# Patient Record
Sex: Male | Born: 1945
Health system: Southern US, Community
[De-identification: ages and names within clinical notes are randomized; demographics above are authoritative.]

## PROBLEM LIST (undated history)

## (undated) DIAGNOSIS — Z800 Family history of malignant neoplasm of digestive organs: Secondary | ICD-10-CM

## (undated) DIAGNOSIS — M1990 Unspecified osteoarthritis, unspecified site: Secondary | ICD-10-CM

## (undated) DIAGNOSIS — K602 Anal fissure, unspecified: Secondary | ICD-10-CM

## (undated) DIAGNOSIS — I2510 Atherosclerotic heart disease of native coronary artery without angina pectoris: Secondary | ICD-10-CM

## (undated) DIAGNOSIS — T8859XA Other complications of anesthesia, initial encounter: Secondary | ICD-10-CM

## (undated) DIAGNOSIS — N200 Calculus of kidney: Secondary | ICD-10-CM

## (undated) DIAGNOSIS — K5792 Diverticulitis of intestine, part unspecified, without perforation or abscess without bleeding: Secondary | ICD-10-CM

## (undated) DIAGNOSIS — R112 Nausea with vomiting, unspecified: Secondary | ICD-10-CM

## (undated) DIAGNOSIS — Z9889 Other specified postprocedural states: Secondary | ICD-10-CM

## (undated) DIAGNOSIS — D649 Anemia, unspecified: Secondary | ICD-10-CM

## (undated) DIAGNOSIS — Z87442 Personal history of urinary calculi: Secondary | ICD-10-CM

## (undated) HISTORY — PX: SPINAL CORD STIMULATOR IMPLANT: SHX2422

## (undated) HISTORY — PX: OTHER SURGICAL HISTORY: SHX169

## (undated) HISTORY — DX: Family history of malignant neoplasm of digestive organs: Z80.0

## (undated) HISTORY — DX: Diverticulitis of intestine, part unspecified, without perforation or abscess without bleeding: K57.92

## (undated) HISTORY — PX: CIRCUMCISION: SUR203

## (undated) HISTORY — DX: Anal fissure, unspecified: K60.2

## (undated) HISTORY — DX: Calculus of kidney: N20.0

## (undated) SURGERY — COLONOSCOPY
Anesthesia: Moderate Sedation

---

## 2010-05-02 ENCOUNTER — Ambulatory Visit: Payer: Self-pay | Admitting: Cardiology

## 2011-11-10 DIAGNOSIS — S4350XA Sprain of unspecified acromioclavicular joint, initial encounter: Secondary | ICD-10-CM | POA: Diagnosis not present

## 2011-11-12 DIAGNOSIS — M25519 Pain in unspecified shoulder: Secondary | ICD-10-CM | POA: Diagnosis not present

## 2011-11-12 DIAGNOSIS — S4350XA Sprain of unspecified acromioclavicular joint, initial encounter: Secondary | ICD-10-CM | POA: Diagnosis not present

## 2011-11-17 DIAGNOSIS — M47817 Spondylosis without myelopathy or radiculopathy, lumbosacral region: Secondary | ICD-10-CM | POA: Diagnosis not present

## 2011-11-17 DIAGNOSIS — M5126 Other intervertebral disc displacement, lumbar region: Secondary | ICD-10-CM | POA: Diagnosis not present

## 2011-11-23 DIAGNOSIS — M5126 Other intervertebral disc displacement, lumbar region: Secondary | ICD-10-CM | POA: Diagnosis not present

## 2011-11-23 DIAGNOSIS — M6281 Muscle weakness (generalized): Secondary | ICD-10-CM | POA: Diagnosis not present

## 2011-11-23 DIAGNOSIS — IMO0001 Reserved for inherently not codable concepts without codable children: Secondary | ICD-10-CM | POA: Diagnosis not present

## 2011-11-23 DIAGNOSIS — M47817 Spondylosis without myelopathy or radiculopathy, lumbosacral region: Secondary | ICD-10-CM | POA: Diagnosis not present

## 2011-11-25 DIAGNOSIS — IMO0001 Reserved for inherently not codable concepts without codable children: Secondary | ICD-10-CM | POA: Diagnosis not present

## 2011-11-25 DIAGNOSIS — M5126 Other intervertebral disc displacement, lumbar region: Secondary | ICD-10-CM | POA: Diagnosis not present

## 2011-11-25 DIAGNOSIS — M6281 Muscle weakness (generalized): Secondary | ICD-10-CM | POA: Diagnosis not present

## 2011-11-25 DIAGNOSIS — M47817 Spondylosis without myelopathy or radiculopathy, lumbosacral region: Secondary | ICD-10-CM | POA: Diagnosis not present

## 2011-11-30 DIAGNOSIS — IMO0001 Reserved for inherently not codable concepts without codable children: Secondary | ICD-10-CM | POA: Diagnosis not present

## 2011-11-30 DIAGNOSIS — M6281 Muscle weakness (generalized): Secondary | ICD-10-CM | POA: Diagnosis not present

## 2011-11-30 DIAGNOSIS — M47817 Spondylosis without myelopathy or radiculopathy, lumbosacral region: Secondary | ICD-10-CM | POA: Diagnosis not present

## 2011-11-30 DIAGNOSIS — M5126 Other intervertebral disc displacement, lumbar region: Secondary | ICD-10-CM | POA: Diagnosis not present

## 2011-12-02 DIAGNOSIS — M5126 Other intervertebral disc displacement, lumbar region: Secondary | ICD-10-CM | POA: Diagnosis not present

## 2011-12-02 DIAGNOSIS — IMO0001 Reserved for inherently not codable concepts without codable children: Secondary | ICD-10-CM | POA: Diagnosis not present

## 2011-12-02 DIAGNOSIS — M6281 Muscle weakness (generalized): Secondary | ICD-10-CM | POA: Diagnosis not present

## 2011-12-02 DIAGNOSIS — M47817 Spondylosis without myelopathy or radiculopathy, lumbosacral region: Secondary | ICD-10-CM | POA: Diagnosis not present

## 2011-12-07 DIAGNOSIS — M5126 Other intervertebral disc displacement, lumbar region: Secondary | ICD-10-CM | POA: Diagnosis not present

## 2011-12-07 DIAGNOSIS — M47817 Spondylosis without myelopathy or radiculopathy, lumbosacral region: Secondary | ICD-10-CM | POA: Diagnosis not present

## 2011-12-07 DIAGNOSIS — IMO0001 Reserved for inherently not codable concepts without codable children: Secondary | ICD-10-CM | POA: Diagnosis not present

## 2011-12-15 DIAGNOSIS — M47817 Spondylosis without myelopathy or radiculopathy, lumbosacral region: Secondary | ICD-10-CM | POA: Diagnosis not present

## 2011-12-15 DIAGNOSIS — M5126 Other intervertebral disc displacement, lumbar region: Secondary | ICD-10-CM | POA: Diagnosis not present

## 2011-12-15 DIAGNOSIS — IMO0001 Reserved for inherently not codable concepts without codable children: Secondary | ICD-10-CM | POA: Diagnosis not present

## 2011-12-17 DIAGNOSIS — M5126 Other intervertebral disc displacement, lumbar region: Secondary | ICD-10-CM | POA: Diagnosis not present

## 2011-12-17 DIAGNOSIS — M25519 Pain in unspecified shoulder: Secondary | ICD-10-CM | POA: Diagnosis not present

## 2011-12-17 DIAGNOSIS — IMO0001 Reserved for inherently not codable concepts without codable children: Secondary | ICD-10-CM | POA: Diagnosis not present

## 2011-12-17 DIAGNOSIS — M47817 Spondylosis without myelopathy or radiculopathy, lumbosacral region: Secondary | ICD-10-CM | POA: Diagnosis not present

## 2011-12-17 DIAGNOSIS — S4350XA Sprain of unspecified acromioclavicular joint, initial encounter: Secondary | ICD-10-CM | POA: Diagnosis not present

## 2011-12-22 DIAGNOSIS — M5126 Other intervertebral disc displacement, lumbar region: Secondary | ICD-10-CM | POA: Diagnosis not present

## 2011-12-22 DIAGNOSIS — M47817 Spondylosis without myelopathy or radiculopathy, lumbosacral region: Secondary | ICD-10-CM | POA: Diagnosis not present

## 2011-12-22 DIAGNOSIS — IMO0001 Reserved for inherently not codable concepts without codable children: Secondary | ICD-10-CM | POA: Diagnosis not present

## 2011-12-24 DIAGNOSIS — M5126 Other intervertebral disc displacement, lumbar region: Secondary | ICD-10-CM | POA: Diagnosis not present

## 2011-12-24 DIAGNOSIS — IMO0001 Reserved for inherently not codable concepts without codable children: Secondary | ICD-10-CM | POA: Diagnosis not present

## 2011-12-24 DIAGNOSIS — M47817 Spondylosis without myelopathy or radiculopathy, lumbosacral region: Secondary | ICD-10-CM | POA: Diagnosis not present

## 2011-12-28 DIAGNOSIS — M5126 Other intervertebral disc displacement, lumbar region: Secondary | ICD-10-CM | POA: Diagnosis not present

## 2011-12-28 DIAGNOSIS — IMO0001 Reserved for inherently not codable concepts without codable children: Secondary | ICD-10-CM | POA: Diagnosis not present

## 2011-12-28 DIAGNOSIS — M47817 Spondylosis without myelopathy or radiculopathy, lumbosacral region: Secondary | ICD-10-CM | POA: Diagnosis not present

## 2011-12-30 DIAGNOSIS — M47817 Spondylosis without myelopathy or radiculopathy, lumbosacral region: Secondary | ICD-10-CM | POA: Diagnosis not present

## 2011-12-30 DIAGNOSIS — IMO0001 Reserved for inherently not codable concepts without codable children: Secondary | ICD-10-CM | POA: Diagnosis not present

## 2011-12-30 DIAGNOSIS — M5126 Other intervertebral disc displacement, lumbar region: Secondary | ICD-10-CM | POA: Diagnosis not present

## 2012-01-01 DIAGNOSIS — IMO0001 Reserved for inherently not codable concepts without codable children: Secondary | ICD-10-CM | POA: Diagnosis not present

## 2012-01-01 DIAGNOSIS — M47817 Spondylosis without myelopathy or radiculopathy, lumbosacral region: Secondary | ICD-10-CM | POA: Diagnosis not present

## 2012-01-04 DIAGNOSIS — IMO0001 Reserved for inherently not codable concepts without codable children: Secondary | ICD-10-CM | POA: Diagnosis not present

## 2012-01-04 DIAGNOSIS — M47817 Spondylosis without myelopathy or radiculopathy, lumbosacral region: Secondary | ICD-10-CM | POA: Diagnosis not present

## 2012-01-06 DIAGNOSIS — IMO0001 Reserved for inherently not codable concepts without codable children: Secondary | ICD-10-CM | POA: Diagnosis not present

## 2012-01-06 DIAGNOSIS — M47817 Spondylosis without myelopathy or radiculopathy, lumbosacral region: Secondary | ICD-10-CM | POA: Diagnosis not present

## 2012-01-20 DIAGNOSIS — M47817 Spondylosis without myelopathy or radiculopathy, lumbosacral region: Secondary | ICD-10-CM | POA: Diagnosis not present

## 2012-01-21 DIAGNOSIS — M5137 Other intervertebral disc degeneration, lumbosacral region: Secondary | ICD-10-CM | POA: Diagnosis not present

## 2012-01-21 DIAGNOSIS — M47817 Spondylosis without myelopathy or radiculopathy, lumbosacral region: Secondary | ICD-10-CM | POA: Diagnosis not present

## 2012-01-21 DIAGNOSIS — M5126 Other intervertebral disc displacement, lumbar region: Secondary | ICD-10-CM | POA: Diagnosis not present

## 2012-01-21 DIAGNOSIS — M51379 Other intervertebral disc degeneration, lumbosacral region without mention of lumbar back pain or lower extremity pain: Secondary | ICD-10-CM | POA: Diagnosis not present

## 2012-02-12 DIAGNOSIS — M47817 Spondylosis without myelopathy or radiculopathy, lumbosacral region: Secondary | ICD-10-CM | POA: Diagnosis not present

## 2012-02-12 DIAGNOSIS — M5126 Other intervertebral disc displacement, lumbar region: Secondary | ICD-10-CM | POA: Diagnosis not present

## 2012-05-30 DIAGNOSIS — M5126 Other intervertebral disc displacement, lumbar region: Secondary | ICD-10-CM | POA: Diagnosis not present

## 2012-05-30 DIAGNOSIS — M47817 Spondylosis without myelopathy or radiculopathy, lumbosacral region: Secondary | ICD-10-CM | POA: Diagnosis not present

## 2012-06-17 DIAGNOSIS — Z01818 Encounter for other preprocedural examination: Secondary | ICD-10-CM | POA: Diagnosis not present

## 2012-06-17 DIAGNOSIS — E669 Obesity, unspecified: Secondary | ICD-10-CM | POA: Diagnosis not present

## 2012-06-17 DIAGNOSIS — N400 Enlarged prostate without lower urinary tract symptoms: Secondary | ICD-10-CM | POA: Diagnosis not present

## 2012-06-17 DIAGNOSIS — M47817 Spondylosis without myelopathy or radiculopathy, lumbosacral region: Secondary | ICD-10-CM | POA: Diagnosis not present

## 2012-06-17 DIAGNOSIS — R42 Dizziness and giddiness: Secondary | ICD-10-CM | POA: Diagnosis not present

## 2012-06-17 DIAGNOSIS — Z79899 Other long term (current) drug therapy: Secondary | ICD-10-CM | POA: Diagnosis not present

## 2012-06-21 DIAGNOSIS — Z8489 Family history of other specified conditions: Secondary | ICD-10-CM | POA: Diagnosis not present

## 2012-06-21 DIAGNOSIS — Z87442 Personal history of urinary calculi: Secondary | ICD-10-CM | POA: Diagnosis not present

## 2012-06-21 DIAGNOSIS — Z981 Arthrodesis status: Secondary | ICD-10-CM | POA: Diagnosis not present

## 2012-06-21 DIAGNOSIS — K146 Glossodynia: Secondary | ICD-10-CM | POA: Diagnosis not present

## 2012-06-21 DIAGNOSIS — N400 Enlarged prostate without lower urinary tract symptoms: Secondary | ICD-10-CM | POA: Diagnosis not present

## 2012-06-21 DIAGNOSIS — M47817 Spondylosis without myelopathy or radiculopathy, lumbosacral region: Secondary | ICD-10-CM | POA: Diagnosis not present

## 2012-06-21 DIAGNOSIS — Z6835 Body mass index (BMI) 35.0-35.9, adult: Secondary | ICD-10-CM | POA: Diagnosis not present

## 2012-06-21 DIAGNOSIS — E669 Obesity, unspecified: Secondary | ICD-10-CM | POA: Diagnosis not present

## 2012-06-21 DIAGNOSIS — Z800 Family history of malignant neoplasm of digestive organs: Secondary | ICD-10-CM | POA: Diagnosis not present

## 2012-06-21 DIAGNOSIS — R112 Nausea with vomiting, unspecified: Secondary | ICD-10-CM | POA: Diagnosis not present

## 2012-06-21 DIAGNOSIS — Z9889 Other specified postprocedural states: Secondary | ICD-10-CM | POA: Diagnosis not present

## 2012-06-21 DIAGNOSIS — Z79899 Other long term (current) drug therapy: Secondary | ICD-10-CM | POA: Diagnosis not present

## 2012-07-01 DIAGNOSIS — Z981 Arthrodesis status: Secondary | ICD-10-CM | POA: Diagnosis not present

## 2012-07-01 DIAGNOSIS — M47817 Spondylosis without myelopathy or radiculopathy, lumbosacral region: Secondary | ICD-10-CM | POA: Diagnosis not present

## 2012-09-07 DIAGNOSIS — Z23 Encounter for immunization: Secondary | ICD-10-CM | POA: Diagnosis not present

## 2012-09-08 DIAGNOSIS — IMO0001 Reserved for inherently not codable concepts without codable children: Secondary | ICD-10-CM | POA: Diagnosis not present

## 2012-09-08 DIAGNOSIS — M47817 Spondylosis without myelopathy or radiculopathy, lumbosacral region: Secondary | ICD-10-CM | POA: Diagnosis not present

## 2012-09-15 DIAGNOSIS — IMO0001 Reserved for inherently not codable concepts without codable children: Secondary | ICD-10-CM | POA: Diagnosis not present

## 2012-09-15 DIAGNOSIS — M47817 Spondylosis without myelopathy or radiculopathy, lumbosacral region: Secondary | ICD-10-CM | POA: Diagnosis not present

## 2012-09-22 DIAGNOSIS — M47817 Spondylosis without myelopathy or radiculopathy, lumbosacral region: Secondary | ICD-10-CM | POA: Diagnosis not present

## 2012-09-22 DIAGNOSIS — IMO0001 Reserved for inherently not codable concepts without codable children: Secondary | ICD-10-CM | POA: Diagnosis not present

## 2012-10-06 DIAGNOSIS — E669 Obesity, unspecified: Secondary | ICD-10-CM | POA: Diagnosis not present

## 2012-10-06 DIAGNOSIS — E7800 Pure hypercholesterolemia, unspecified: Secondary | ICD-10-CM | POA: Diagnosis not present

## 2012-10-06 DIAGNOSIS — N400 Enlarged prostate without lower urinary tract symptoms: Secondary | ICD-10-CM | POA: Diagnosis not present

## 2012-10-12 DIAGNOSIS — E669 Obesity, unspecified: Secondary | ICD-10-CM | POA: Diagnosis not present

## 2012-10-12 DIAGNOSIS — M545 Low back pain: Secondary | ICD-10-CM | POA: Diagnosis not present

## 2012-10-12 DIAGNOSIS — E7800 Pure hypercholesterolemia, unspecified: Secondary | ICD-10-CM | POA: Diagnosis not present

## 2012-10-12 DIAGNOSIS — N400 Enlarged prostate without lower urinary tract symptoms: Secondary | ICD-10-CM | POA: Diagnosis not present

## 2012-10-19 DIAGNOSIS — M47817 Spondylosis without myelopathy or radiculopathy, lumbosacral region: Secondary | ICD-10-CM | POA: Diagnosis not present

## 2012-12-19 DIAGNOSIS — M79609 Pain in unspecified limb: Secondary | ICD-10-CM | POA: Diagnosis not present

## 2012-12-19 DIAGNOSIS — L600 Ingrowing nail: Secondary | ICD-10-CM | POA: Diagnosis not present

## 2013-01-19 DIAGNOSIS — M5126 Other intervertebral disc displacement, lumbar region: Secondary | ICD-10-CM | POA: Diagnosis not present

## 2013-01-19 DIAGNOSIS — M47817 Spondylosis without myelopathy or radiculopathy, lumbosacral region: Secondary | ICD-10-CM | POA: Diagnosis not present

## 2013-03-01 DIAGNOSIS — L299 Pruritus, unspecified: Secondary | ICD-10-CM | POA: Diagnosis not present

## 2013-03-01 DIAGNOSIS — K2960 Other gastritis without bleeding: Secondary | ICD-10-CM | POA: Diagnosis not present

## 2013-03-03 DIAGNOSIS — Z981 Arthrodesis status: Secondary | ICD-10-CM | POA: Diagnosis not present

## 2013-03-03 DIAGNOSIS — M47817 Spondylosis without myelopathy or radiculopathy, lumbosacral region: Secondary | ICD-10-CM | POA: Diagnosis not present

## 2013-03-07 DIAGNOSIS — T85695A Other mechanical complication of other nervous system device, implant or graft, initial encounter: Secondary | ICD-10-CM | POA: Diagnosis not present

## 2013-03-07 DIAGNOSIS — M4800 Spinal stenosis, site unspecified: Secondary | ICD-10-CM | POA: Diagnosis not present

## 2013-03-07 DIAGNOSIS — M47817 Spondylosis without myelopathy or radiculopathy, lumbosacral region: Secondary | ICD-10-CM | POA: Diagnosis not present

## 2013-03-07 DIAGNOSIS — M5137 Other intervertebral disc degeneration, lumbosacral region: Secondary | ICD-10-CM | POA: Diagnosis not present

## 2013-03-07 DIAGNOSIS — Z981 Arthrodesis status: Secondary | ICD-10-CM | POA: Diagnosis not present

## 2013-03-13 DIAGNOSIS — M5126 Other intervertebral disc displacement, lumbar region: Secondary | ICD-10-CM | POA: Diagnosis not present

## 2013-03-13 DIAGNOSIS — M47817 Spondylosis without myelopathy or radiculopathy, lumbosacral region: Secondary | ICD-10-CM | POA: Diagnosis not present

## 2013-03-30 DIAGNOSIS — D235 Other benign neoplasm of skin of trunk: Secondary | ICD-10-CM | POA: Diagnosis not present

## 2013-03-30 DIAGNOSIS — L570 Actinic keratosis: Secondary | ICD-10-CM | POA: Diagnosis not present

## 2013-03-30 DIAGNOSIS — L820 Inflamed seborrheic keratosis: Secondary | ICD-10-CM | POA: Diagnosis not present

## 2013-03-30 DIAGNOSIS — L259 Unspecified contact dermatitis, unspecified cause: Secondary | ICD-10-CM | POA: Diagnosis not present

## 2013-04-05 DIAGNOSIS — E7800 Pure hypercholesterolemia, unspecified: Secondary | ICD-10-CM | POA: Diagnosis not present

## 2013-04-05 DIAGNOSIS — E669 Obesity, unspecified: Secondary | ICD-10-CM | POA: Diagnosis not present

## 2013-04-05 DIAGNOSIS — M545 Low back pain: Secondary | ICD-10-CM | POA: Diagnosis not present

## 2013-04-05 DIAGNOSIS — N400 Enlarged prostate without lower urinary tract symptoms: Secondary | ICD-10-CM | POA: Diagnosis not present

## 2013-04-11 DIAGNOSIS — M5430 Sciatica, unspecified side: Secondary | ICD-10-CM | POA: Diagnosis not present

## 2013-04-11 DIAGNOSIS — M545 Low back pain: Secondary | ICD-10-CM | POA: Diagnosis not present

## 2013-04-11 DIAGNOSIS — E7800 Pure hypercholesterolemia, unspecified: Secondary | ICD-10-CM | POA: Diagnosis not present

## 2013-04-11 DIAGNOSIS — E669 Obesity, unspecified: Secondary | ICD-10-CM | POA: Diagnosis not present

## 2013-04-11 DIAGNOSIS — N400 Enlarged prostate without lower urinary tract symptoms: Secondary | ICD-10-CM | POA: Diagnosis not present

## 2013-05-31 DIAGNOSIS — N200 Calculus of kidney: Secondary | ICD-10-CM | POA: Diagnosis not present

## 2013-05-31 DIAGNOSIS — Z79899 Other long term (current) drug therapy: Secondary | ICD-10-CM | POA: Diagnosis not present

## 2013-05-31 DIAGNOSIS — G8929 Other chronic pain: Secondary | ICD-10-CM | POA: Diagnosis not present

## 2013-05-31 DIAGNOSIS — K439 Ventral hernia without obstruction or gangrene: Secondary | ICD-10-CM | POA: Diagnosis not present

## 2013-05-31 DIAGNOSIS — R109 Unspecified abdominal pain: Secondary | ICD-10-CM | POA: Diagnosis not present

## 2013-05-31 DIAGNOSIS — M549 Dorsalgia, unspecified: Secondary | ICD-10-CM | POA: Diagnosis not present

## 2013-05-31 DIAGNOSIS — R1033 Periumbilical pain: Secondary | ICD-10-CM | POA: Diagnosis not present

## 2013-07-25 DIAGNOSIS — M5126 Other intervertebral disc displacement, lumbar region: Secondary | ICD-10-CM | POA: Diagnosis not present

## 2013-07-25 DIAGNOSIS — M47817 Spondylosis without myelopathy or radiculopathy, lumbosacral region: Secondary | ICD-10-CM | POA: Diagnosis not present

## 2013-09-01 DIAGNOSIS — Z23 Encounter for immunization: Secondary | ICD-10-CM | POA: Diagnosis not present

## 2013-10-12 DIAGNOSIS — R809 Proteinuria, unspecified: Secondary | ICD-10-CM | POA: Diagnosis not present

## 2013-10-12 DIAGNOSIS — E669 Obesity, unspecified: Secondary | ICD-10-CM | POA: Diagnosis not present

## 2013-10-12 DIAGNOSIS — E7800 Pure hypercholesterolemia, unspecified: Secondary | ICD-10-CM | POA: Diagnosis not present

## 2013-10-19 DIAGNOSIS — E669 Obesity, unspecified: Secondary | ICD-10-CM | POA: Diagnosis not present

## 2013-10-19 DIAGNOSIS — N400 Enlarged prostate without lower urinary tract symptoms: Secondary | ICD-10-CM | POA: Diagnosis not present

## 2013-10-19 DIAGNOSIS — E7800 Pure hypercholesterolemia, unspecified: Secondary | ICD-10-CM | POA: Diagnosis not present

## 2013-10-19 DIAGNOSIS — M545 Low back pain: Secondary | ICD-10-CM | POA: Diagnosis not present

## 2013-10-19 DIAGNOSIS — M5430 Sciatica, unspecified side: Secondary | ICD-10-CM | POA: Diagnosis not present

## 2014-01-25 DIAGNOSIS — M47817 Spondylosis without myelopathy or radiculopathy, lumbosacral region: Secondary | ICD-10-CM | POA: Diagnosis not present

## 2014-01-25 DIAGNOSIS — M5126 Other intervertebral disc displacement, lumbar region: Secondary | ICD-10-CM | POA: Diagnosis not present

## 2014-04-11 DIAGNOSIS — E669 Obesity, unspecified: Secondary | ICD-10-CM | POA: Diagnosis not present

## 2014-04-11 DIAGNOSIS — E7800 Pure hypercholesterolemia, unspecified: Secondary | ICD-10-CM | POA: Diagnosis not present

## 2014-04-12 DIAGNOSIS — E7800 Pure hypercholesterolemia, unspecified: Secondary | ICD-10-CM | POA: Diagnosis not present

## 2014-04-19 DIAGNOSIS — E7800 Pure hypercholesterolemia, unspecified: Secondary | ICD-10-CM | POA: Diagnosis not present

## 2014-04-19 DIAGNOSIS — N400 Enlarged prostate without lower urinary tract symptoms: Secondary | ICD-10-CM | POA: Diagnosis not present

## 2014-04-19 DIAGNOSIS — M5430 Sciatica, unspecified side: Secondary | ICD-10-CM | POA: Diagnosis not present

## 2014-04-19 DIAGNOSIS — M545 Low back pain, unspecified: Secondary | ICD-10-CM | POA: Diagnosis not present

## 2014-04-19 DIAGNOSIS — E669 Obesity, unspecified: Secondary | ICD-10-CM | POA: Diagnosis not present

## 2014-05-29 DIAGNOSIS — T63461A Toxic effect of venom of wasps, accidental (unintentional), initial encounter: Secondary | ICD-10-CM | POA: Diagnosis not present

## 2014-05-29 DIAGNOSIS — T6391XA Toxic effect of contact with unspecified venomous animal, accidental (unintentional), initial encounter: Secondary | ICD-10-CM | POA: Diagnosis not present

## 2014-05-29 DIAGNOSIS — Z91038 Other insect allergy status: Secondary | ICD-10-CM | POA: Diagnosis not present

## 2014-05-29 DIAGNOSIS — X58XXXA Exposure to other specified factors, initial encounter: Secondary | ICD-10-CM | POA: Diagnosis not present

## 2014-06-16 DIAGNOSIS — R197 Diarrhea, unspecified: Secondary | ICD-10-CM | POA: Diagnosis not present

## 2014-06-21 DIAGNOSIS — T63461A Toxic effect of venom of wasps, accidental (unintentional), initial encounter: Secondary | ICD-10-CM | POA: Diagnosis not present

## 2014-06-21 DIAGNOSIS — N400 Enlarged prostate without lower urinary tract symptoms: Secondary | ICD-10-CM | POA: Diagnosis not present

## 2014-06-21 DIAGNOSIS — Z87442 Personal history of urinary calculi: Secondary | ICD-10-CM | POA: Diagnosis not present

## 2014-06-21 DIAGNOSIS — Z79899 Other long term (current) drug therapy: Secondary | ICD-10-CM | POA: Diagnosis not present

## 2014-06-21 DIAGNOSIS — X58XXXA Exposure to other specified factors, initial encounter: Secondary | ICD-10-CM | POA: Diagnosis not present

## 2014-06-21 DIAGNOSIS — T6391XA Toxic effect of contact with unspecified venomous animal, accidental (unintentional), initial encounter: Secondary | ICD-10-CM | POA: Diagnosis not present

## 2014-07-03 DIAGNOSIS — H43819 Vitreous degeneration, unspecified eye: Secondary | ICD-10-CM | POA: Diagnosis not present

## 2014-09-04 DIAGNOSIS — Z23 Encounter for immunization: Secondary | ICD-10-CM | POA: Diagnosis not present

## 2014-09-12 DIAGNOSIS — N400 Enlarged prostate without lower urinary tract symptoms: Secondary | ICD-10-CM | POA: Diagnosis not present

## 2014-09-12 DIAGNOSIS — K5732 Diverticulitis of large intestine without perforation or abscess without bleeding: Secondary | ICD-10-CM | POA: Diagnosis not present

## 2014-09-12 DIAGNOSIS — M65342 Trigger finger, left ring finger: Secondary | ICD-10-CM | POA: Diagnosis not present

## 2014-10-12 DIAGNOSIS — M79645 Pain in left finger(s): Secondary | ICD-10-CM | POA: Diagnosis not present

## 2014-10-12 DIAGNOSIS — M65342 Trigger finger, left ring finger: Secondary | ICD-10-CM | POA: Diagnosis not present

## 2014-10-15 DIAGNOSIS — N400 Enlarged prostate without lower urinary tract symptoms: Secondary | ICD-10-CM | POA: Diagnosis not present

## 2014-10-15 DIAGNOSIS — E780 Pure hypercholesterolemia: Secondary | ICD-10-CM | POA: Diagnosis not present

## 2014-10-22 DIAGNOSIS — Z1389 Encounter for screening for other disorder: Secondary | ICD-10-CM | POA: Diagnosis not present

## 2014-10-22 DIAGNOSIS — E6609 Other obesity due to excess calories: Secondary | ICD-10-CM | POA: Diagnosis not present

## 2014-10-22 DIAGNOSIS — E780 Pure hypercholesterolemia: Secondary | ICD-10-CM | POA: Diagnosis not present

## 2014-10-22 DIAGNOSIS — M545 Low back pain: Secondary | ICD-10-CM | POA: Diagnosis not present

## 2014-10-22 DIAGNOSIS — N401 Enlarged prostate with lower urinary tract symptoms: Secondary | ICD-10-CM | POA: Diagnosis not present

## 2014-10-22 DIAGNOSIS — Z23 Encounter for immunization: Secondary | ICD-10-CM | POA: Diagnosis not present

## 2014-10-30 DIAGNOSIS — J209 Acute bronchitis, unspecified: Secondary | ICD-10-CM | POA: Diagnosis not present

## 2014-10-30 DIAGNOSIS — R062 Wheezing: Secondary | ICD-10-CM | POA: Diagnosis not present

## 2015-02-27 DIAGNOSIS — T63441A Toxic effect of venom of bees, accidental (unintentional), initial encounter: Secondary | ICD-10-CM | POA: Diagnosis not present

## 2015-02-27 DIAGNOSIS — N400 Enlarged prostate without lower urinary tract symptoms: Secondary | ICD-10-CM | POA: Diagnosis not present

## 2015-02-27 DIAGNOSIS — K5790 Diverticulosis of intestine, part unspecified, without perforation or abscess without bleeding: Secondary | ICD-10-CM | POA: Diagnosis not present

## 2015-02-27 DIAGNOSIS — W57XXXA Bitten or stung by nonvenomous insect and other nonvenomous arthropods, initial encounter: Secondary | ICD-10-CM | POA: Diagnosis not present

## 2015-02-27 DIAGNOSIS — M1990 Unspecified osteoarthritis, unspecified site: Secondary | ICD-10-CM | POA: Diagnosis not present

## 2015-02-27 DIAGNOSIS — R0602 Shortness of breath: Secondary | ICD-10-CM | POA: Diagnosis not present

## 2015-02-27 DIAGNOSIS — M47896 Other spondylosis, lumbar region: Secondary | ICD-10-CM | POA: Diagnosis not present

## 2015-02-28 DIAGNOSIS — T63441S Toxic effect of venom of bees, accidental (unintentional), sequela: Secondary | ICD-10-CM | POA: Diagnosis not present

## 2015-04-11 DIAGNOSIS — E780 Pure hypercholesterolemia: Secondary | ICD-10-CM | POA: Diagnosis not present

## 2015-04-11 DIAGNOSIS — K5732 Diverticulitis of large intestine without perforation or abscess without bleeding: Secondary | ICD-10-CM | POA: Diagnosis not present

## 2015-04-12 DIAGNOSIS — E780 Pure hypercholesterolemia: Secondary | ICD-10-CM | POA: Diagnosis not present

## 2015-04-19 DIAGNOSIS — E780 Pure hypercholesterolemia: Secondary | ICD-10-CM | POA: Diagnosis not present

## 2015-04-19 DIAGNOSIS — E6609 Other obesity due to excess calories: Secondary | ICD-10-CM | POA: Diagnosis not present

## 2015-04-19 DIAGNOSIS — J069 Acute upper respiratory infection, unspecified: Secondary | ICD-10-CM | POA: Diagnosis not present

## 2015-04-19 DIAGNOSIS — N401 Enlarged prostate with lower urinary tract symptoms: Secondary | ICD-10-CM | POA: Diagnosis not present

## 2015-04-19 DIAGNOSIS — S90569A Insect bite (nonvenomous), unspecified ankle, initial encounter: Secondary | ICD-10-CM | POA: Diagnosis not present

## 2015-04-19 DIAGNOSIS — M545 Low back pain: Secondary | ICD-10-CM | POA: Diagnosis not present

## 2015-06-12 DIAGNOSIS — M47816 Spondylosis without myelopathy or radiculopathy, lumbar region: Secondary | ICD-10-CM | POA: Diagnosis not present

## 2015-06-21 DIAGNOSIS — R1032 Left lower quadrant pain: Secondary | ICD-10-CM | POA: Diagnosis not present

## 2015-06-21 DIAGNOSIS — R197 Diarrhea, unspecified: Secondary | ICD-10-CM | POA: Diagnosis not present

## 2015-06-21 DIAGNOSIS — M545 Low back pain: Secondary | ICD-10-CM | POA: Diagnosis not present

## 2015-06-21 DIAGNOSIS — L570 Actinic keratosis: Secondary | ICD-10-CM | POA: Diagnosis not present

## 2015-07-09 ENCOUNTER — Encounter (INDEPENDENT_AMBULATORY_CARE_PROVIDER_SITE_OTHER): Payer: Self-pay | Admitting: *Deleted

## 2015-07-23 DIAGNOSIS — M5126 Other intervertebral disc displacement, lumbar region: Secondary | ICD-10-CM | POA: Diagnosis not present

## 2015-07-23 DIAGNOSIS — M47816 Spondylosis without myelopathy or radiculopathy, lumbar region: Secondary | ICD-10-CM | POA: Diagnosis not present

## 2015-07-25 DIAGNOSIS — M545 Low back pain: Secondary | ICD-10-CM | POA: Diagnosis not present

## 2015-07-25 DIAGNOSIS — M4807 Spinal stenosis, lumbosacral region: Secondary | ICD-10-CM | POA: Diagnosis not present

## 2015-07-25 DIAGNOSIS — Z981 Arthrodesis status: Secondary | ICD-10-CM | POA: Diagnosis not present

## 2015-07-25 DIAGNOSIS — M4806 Spinal stenosis, lumbar region: Secondary | ICD-10-CM | POA: Diagnosis not present

## 2015-07-25 DIAGNOSIS — R937 Abnormal findings on diagnostic imaging of other parts of musculoskeletal system: Secondary | ICD-10-CM | POA: Diagnosis not present

## 2015-07-25 DIAGNOSIS — M47816 Spondylosis without myelopathy or radiculopathy, lumbar region: Secondary | ICD-10-CM | POA: Diagnosis not present

## 2015-08-01 DIAGNOSIS — M47816 Spondylosis without myelopathy or radiculopathy, lumbar region: Secondary | ICD-10-CM | POA: Diagnosis not present

## 2015-08-14 ENCOUNTER — Other Ambulatory Visit (INDEPENDENT_AMBULATORY_CARE_PROVIDER_SITE_OTHER): Payer: Self-pay | Admitting: Internal Medicine

## 2015-08-14 ENCOUNTER — Encounter (INDEPENDENT_AMBULATORY_CARE_PROVIDER_SITE_OTHER): Payer: Self-pay | Admitting: Internal Medicine

## 2015-08-14 ENCOUNTER — Telehealth (INDEPENDENT_AMBULATORY_CARE_PROVIDER_SITE_OTHER): Payer: Self-pay | Admitting: *Deleted

## 2015-08-14 ENCOUNTER — Ambulatory Visit (INDEPENDENT_AMBULATORY_CARE_PROVIDER_SITE_OTHER): Payer: Medicare Other | Admitting: Internal Medicine

## 2015-08-14 VITALS — BP 156/76 | HR 64 | Temp 98.0°F | Ht 76.0 in | Wt 293.7 lb

## 2015-08-14 DIAGNOSIS — Z800 Family history of malignant neoplasm of digestive organs: Secondary | ICD-10-CM

## 2015-08-14 DIAGNOSIS — R197 Diarrhea, unspecified: Secondary | ICD-10-CM | POA: Diagnosis not present

## 2015-08-14 DIAGNOSIS — R1031 Right lower quadrant pain: Secondary | ICD-10-CM | POA: Diagnosis not present

## 2015-08-14 DIAGNOSIS — Z1211 Encounter for screening for malignant neoplasm of colon: Secondary | ICD-10-CM

## 2015-08-14 NOTE — Progress Notes (Addendum)
   Subjective:    Patient ID: Bruce Stewart, male    DOB: 06-25-1946, 69 y.o.   MRN: [id number]  HPI Here today for screening colonoscopy. Family hx of colon cancer in father who died at age 66. His last colonoscopy was in 2011. He tells me there were no polyps.  There has been no weight. Appetite is good. He says when he has a BM he will have rt sided abdominal pain. The pain will last about 30 minutes. He says he has a lot of flatus and diarrhea. He has frequent diarrhea but does have normal stools. There has been no change in his stools.  He tells me when he eats, he will have to have a BM within 30 minutes. He usually has 4-6 BMs a day. He occasionally takes Imodium for the diarrhea. No melena or BRRB. Hx of diverticulitis.  07/03/2010 High risk screening colonoscopy: Family hx of colon cancer in father diagnosed at age 22. Moderate number of diverticula at sigmoid colon. Single ulcer at terminal ileum, suspect secondary to Aleve that he uses frequently. Aleve would also increase the risk of diverticulitis  04/14/2015 total protein 5.8, albumin 4.2, total bili 0.3 Review of Systems Past Medical History  Diagnosis Date  . Family hx of colon cancer   . Diverticulitis   . Anal fissure   . Kidney stone     Past Surgical History  Procedure Laterality Date  . Repair of anal fissure      spincterotomy  . Circumcision    . Back surgery x 2      Allergies  Allergen Reactions  . Codeine     Itch. Also allergic to oranges which causes itching    No current outpatient prescriptions on file prior to visit.   No current facility-administered medications on file prior to visit.        Objective:   Physical Exam Blood pressure 156/76, pulse 64, temperature 98 F (36.7 C), height 6\' 4"  (1.93 m), weight 293 lb 11.2 oz (133.221 kg).  Alert and oriented. Skin warm and dry. Oral mucosa is moist.   . Sclera anicteric, conjunctivae is pink. Thyroid not enlarged. No cervical  lymphadenopathy. Lungs clear. Heart regular rate and rhythm.  Abdomen is soft. Bowel sounds are positive. No hepatomegaly. No abdominal masses felt. No tenderness.  No edema to lower extremities.         Assessment & Plan:  Family hx of colon cancer in a father. Patient needs surveillance colonoscopy.  Diarrhea: needs surveillance colonoscopy. Rt sided abdominal pain with his BMs,.  The risks and benefits such as perforation, bleeding, and infection were reviewed with the patient and is agreeable. I have requested these records.

## 2015-08-14 NOTE — Telephone Encounter (Signed)
Patient needs trilyte 

## 2015-08-14 NOTE — Patient Instructions (Signed)
Screening colonoscopy.The risks and benefits such as perforation, bleeding, and infection were reviewed with the patient and is agreeable. 

## 2015-08-15 MED ORDER — PEG 3350-KCL-NA BICARB-NACL 420 G PO SOLR: 4000.0000 mL | Freq: Once | ORAL | Status: DC

## 2015-08-23 ENCOUNTER — Encounter (INDEPENDENT_AMBULATORY_CARE_PROVIDER_SITE_OTHER): Payer: Self-pay

## 2015-08-31 DIAGNOSIS — Z23 Encounter for immunization: Secondary | ICD-10-CM | POA: Diagnosis not present

## 2015-09-03 DIAGNOSIS — M47816 Spondylosis without myelopathy or radiculopathy, lumbar region: Secondary | ICD-10-CM | POA: Diagnosis not present

## 2015-09-03 DIAGNOSIS — M179 Osteoarthritis of knee, unspecified: Secondary | ICD-10-CM | POA: Diagnosis not present

## 2015-09-03 DIAGNOSIS — M170 Bilateral primary osteoarthritis of knee: Secondary | ICD-10-CM | POA: Diagnosis not present

## 2015-09-03 DIAGNOSIS — M545 Low back pain: Secondary | ICD-10-CM | POA: Diagnosis not present

## 2015-09-03 DIAGNOSIS — M25561 Pain in right knee: Secondary | ICD-10-CM | POA: Diagnosis not present

## 2015-09-03 DIAGNOSIS — M25562 Pain in left knee: Secondary | ICD-10-CM | POA: Diagnosis not present

## 2015-09-05 DIAGNOSIS — D225 Melanocytic nevi of trunk: Secondary | ICD-10-CM | POA: Diagnosis not present

## 2015-09-05 DIAGNOSIS — L820 Inflamed seborrheic keratosis: Secondary | ICD-10-CM | POA: Diagnosis not present

## 2015-09-05 DIAGNOSIS — B079 Viral wart, unspecified: Secondary | ICD-10-CM | POA: Diagnosis not present

## 2015-09-12 ENCOUNTER — Encounter (HOSPITAL_COMMUNITY): Payer: Self-pay | Admitting: *Deleted

## 2015-09-12 ENCOUNTER — Ambulatory Visit (HOSPITAL_COMMUNITY)
Admission: RE | Admit: 2015-09-12 | Discharge: 2015-09-12 | Disposition: A | Discharge status: Home / Self Care | Payer: Medicare Other | Source: Ambulatory Visit | Attending: Internal Medicine | Admitting: Internal Medicine

## 2015-09-12 ENCOUNTER — Encounter (HOSPITAL_COMMUNITY)
Admission: RE | Disposition: A | Discharge status: Home / Self Care | Payer: Self-pay | Source: Ambulatory Visit | Attending: Internal Medicine

## 2015-09-12 DIAGNOSIS — Z800 Family history of malignant neoplasm of digestive organs: Secondary | ICD-10-CM | POA: Diagnosis not present

## 2015-09-12 DIAGNOSIS — K5730 Diverticulosis of large intestine without perforation or abscess without bleeding: Secondary | ICD-10-CM | POA: Insufficient documentation

## 2015-09-12 DIAGNOSIS — D125 Benign neoplasm of sigmoid colon: Secondary | ICD-10-CM | POA: Insufficient documentation

## 2015-09-12 DIAGNOSIS — Z1211 Encounter for screening for malignant neoplasm of colon: Secondary | ICD-10-CM | POA: Diagnosis not present

## 2015-09-12 DIAGNOSIS — K648 Other hemorrhoids: Secondary | ICD-10-CM | POA: Insufficient documentation

## 2015-09-12 HISTORY — PX: COLONOSCOPY: SHX5424

## 2015-09-12 SURGERY — COLONOSCOPY
Anesthesia: Moderate Sedation

## 2015-09-12 MED ORDER — MIDAZOLAM HCL 5 MG/5ML IJ SOLN
INTRAMUSCULAR | Status: DC | PRN
  Administered 2015-09-12: 2 mg via INTRAVENOUS
  Administered 2015-09-12: 1 mg via INTRAVENOUS
  Administered 2015-09-12: 2 mg via INTRAVENOUS

## 2015-09-12 MED ORDER — SODIUM CHLORIDE 0.9 % IV SOLN
INTRAVENOUS | Status: DC
  Administered 2015-09-12: 09:00:00 via INTRAVENOUS

## 2015-09-12 MED ORDER — MIDAZOLAM HCL 5 MG/5ML IJ SOLN
INTRAMUSCULAR | Status: AC
  Filled 2015-09-12: qty 10

## 2015-09-12 MED ORDER — MEPERIDINE HCL 50 MG/ML IJ SOLN
INTRAMUSCULAR | Status: AC
  Filled 2015-09-12: qty 1

## 2015-09-12 MED ORDER — MEPERIDINE HCL 50 MG/ML IJ SOLN
INTRAMUSCULAR | Status: DC | PRN
  Administered 2015-09-12 (×2): 25 mg

## 2015-09-12 MED ORDER — SIMETHICONE 40 MG/0.6ML PO SUSP
ORAL | Status: DC | PRN
  Administered 2015-09-12: 10:00:00

## 2015-09-12 NOTE — Op Note (Signed)
COLONOSCOPY PROCEDURE REPORT  PATIENT:  Bruce Stewart  MR#:  [id number] Birthdate:  [birth year], 69 y.o., male Endoscopist:  Dr. Rogene Houston, MD Referred By:  Dr. Manon Hilding, MD Procedure Date: 09/12/2015  Procedure:   Colonoscopy  Indications:  Patient is 69 year old Caucasian male who is undergoing high risk screening colonoscopy. Father was diagnosed with colon carcinoma at age 36 and died of metastases 2 years later. Patient's last colonoscopy was in September 2011.  Informed Consent:  The procedure and risks were reviewed with the patient and informed consent was obtained.  Medications:  Demerol 50 mg IV Versed 5 mg IV  Description of procedure:  After a digital rectal exam was performed, that colonoscope was advanced from the anus through the rectum and colon to the area of the cecum, ileocecal valve and appendiceal orifice. The cecum was deeply intubated. These structures were well-seen and photographed for the record. From the level of the cecum and ileocecal valve, the scope was slowly and cautiously withdrawn. The mucosal surfaces were carefully surveyed utilizing scope tip to flexion to facilitate fold flattening as needed. The scope was pulled down into the rectum where a thorough exam including retroflexion was performed.  Findings:   Prep satisfactory. Single small diverticulum at ascending colon. Moderate number of diverticula at sigmoid colon. Small polyp was cold snared for sigmoid colon. Smaller piece was ablated via cold biopsy. Larger piece was lost. Normal rectal mucosa. Prominent hemorrhoids above the dentate line.  Therapeutic/Diagnostic Maneuvers Performed:  See above  Complications:  None  EBL: None  Cecal Withdrawal Time:  13 minutes  Impression:  Examination performed to cecum. Moderate number of diverticula at sigmoid colon along with small one at ascending colon. Small polyp removed from sigmoid colon as above. Internal  hemorrhoids.  Recommendations:  Standard instructions given. High-fiber diet. I will contact patient with biopsy results and further recommendations. Next colonoscopy in 5 years.  Bruce Stewart,Bruce Stewart  09/12/2015 10:08 AM  CC: Dr. Manon Hilding, MD & Dr. Rayne Du ref. provider found

## 2015-09-12 NOTE — H&P (Signed)
Bruce Stewart is an 69 y.o. male.   Chief Complaint: Patient is here for colonoscopy. HPI: Patient is 69 year old Caucasian male who is here for screening colonoscopy. He denies rectal bleeding or change in his bowel habits. He has intermittent soreness in right low quadrant. He is not having any pain today. He also complains of back pain. Last colonoscopy was in September 2011 revealing sigmoid colon diverticulosis and small ileal Urso felt to be secondary to NSAIDs. Family history significant for CRC and father who was 58 at the time of diagnosis and died 2 years later of metastatic disease.  Past Medical History  Diagnosis Date  . Family hx of colon cancer   . Diverticulitis   . Anal fissure   . Kidney stone     Past Surgical History  Procedure Laterality Date  . Repair of anal fissure      spincterotomy  . Circumcision    . Back surgery x 2      Family History  Problem Relation Age of Onset  . Colon cancer Father    Social History:  reports that he has never smoked. He does not have any smokeless tobacco history on file. He reports that he does not drink alcohol or use illicit drugs.  Allergies:  Allergies  Allergen Reactions  . Codeine Itching  . Orange Fruit [Citrus] Itching  . Bee Venom Rash    Medications Prior to Admission  Medication Sig Dispense Refill  . acetaminophen (TYLENOL) 500 MG tablet Take 500 mg by mouth every 6 (six) hours as needed.    . diphenhydramine-acetaminophen (TYLENOL PM) 25-500 MG TABS tablet Take 2 tablets by mouth at bedtime as needed (sleep).     . finasteride (PROSCAR) 5 MG tablet Take 5 mg by mouth daily.    . Fish Oil-Cholecalciferol (FISH OIL + D3 PO) Take by mouth 2 (two) times daily.    Marland Kitchen ibuprofen (ADVIL,MOTRIN) 200 MG tablet Take 600 mg by mouth daily as needed for moderate pain.     . Multiple Vitamin (MULTIVITAMIN WITH MINERALS) TABS tablet Take 1 tablet by mouth daily.    . polyethylene glycol-electrolytes  (NULYTELY/GOLYTELY) 420 G solution Take 4,000 mLs by mouth once. 4000 mL 0  . tamsulosin (FLOMAX) 0.4 MG CAPS capsule Take 0.4 mg by mouth daily.       No results found for this or any previous visit (from the past 48 hour(s)). No results found.  ROS  Blood pressure 171/94, pulse 84, temperature 98.8 F (37.1 C), temperature source Oral, resp. rate 15, height 6\' 4"  (1.93 m), weight 280 lb (127.007 kg), SpO2 100 %. Physical Exam  Constitutional: He appears well-developed and well-nourished.  HENT:  Mouth/Throat: Oropharynx is clear and moist.  Eyes: Conjunctivae are normal. No scleral icterus.  Neck: No thyromegaly present.  Cardiovascular: Normal rate, regular rhythm and normal heart sounds.   No murmur heard. Respiratory: Effort normal and breath sounds normal.  GI:  Full abdomen without tenderness or  organomegaly or masses.  Musculoskeletal: He exhibits no edema.  Lymphadenopathy:    He has no cervical adenopathy.  Neurological: He is alert.  Skin: Skin is warm and dry.     Assessment/Plan High risk screening colonoscopy.  REHMAN,NAJEEB U 09/12/2015, 9:24 AM

## 2015-09-12 NOTE — Progress Notes (Signed)
Patient given instructions with web site and code to sign up for "My Chart"  

## 2015-09-12 NOTE — Discharge Instructions (Signed)
Resume usual medications and high fiber diet. No driving for 24 hours. Physician will call with biopsy results. Next colonoscopy in 5 years High-Fiber Diet Fiber, also called dietary fiber, is a type of carbohydrate found in fruits, vegetables, whole grains, and beans. A high-fiber diet can have many health benefits. Your health care provider may recommend a high-fiber diet to help:  Prevent constipation. Fiber can make your bowel movements more regular.  Lower your cholesterol.  Relieve hemorrhoids, uncomplicated diverticulosis, or irritable bowel syndrome.  Prevent overeating as part of a weight-loss plan.  Prevent heart disease, type 2 diabetes, and certain cancers. WHAT IS MY PLAN? The recommended daily intake of fiber includes:  38 grams for men under age 78.  44 grams for men over age 77.  62 grams for women under age 66.  16 grams for women over age 46. You can get the recommended daily intake of dietary fiber by eating a variety of fruits, vegetables, grains, and beans. Your health care provider may also recommend a fiber supplement if it is not possible to get enough fiber through your diet. WHAT DO I NEED TO KNOW ABOUT A HIGH-FIBER DIET?  Fiber supplements have not been widely studied for their effectiveness, so it is better to get fiber through food sources.  Always check the fiber content on thenutrition facts label of any prepackaged food. Look for foods that contain at least 5 grams of fiber per serving.  Ask your dietitian if you have questions about specific foods that are related to your condition, especially if those foods are not listed in the following section.  Increase your daily fiber consumption gradually. Increasing your intake of dietary fiber too quickly may cause bloating, cramping, or gas.  Drink plenty of water. Water helps you to digest fiber. WHAT FOODS CAN I EAT? Grains Whole-grain breads. Multigrain cereal. Oats and oatmeal. Brown rice.  Barley. Bulgur wheat. [place]. Bran muffins. Popcorn. Rye wafer crackers. Vegetables Sweet potatoes. Spinach. Kale. Artichokes. Cabbage. Broccoli. Green peas. Carrots. Squash. Fruits Berries. Pears. Apples. Oranges. Avocados. Prunes and raisins. Dried figs. Meats and Other Protein Sources [place], kidney, pinto, and soy beans. Split peas. Lentils. Nuts and seeds. Dairy Fiber-fortified yogurt. Beverages Fiber-fortified soy milk. Fiber-fortified orange juice. Other Fiber bars. The items listed above may not be a complete list of recommended foods or beverages. Contact your dietitian for more options. WHAT FOODS ARE NOT RECOMMENDED? Grains White bread. Pasta made with refined flour. White rice. Vegetables Fried potatoes. Canned vegetables. Well-cooked vegetables.  Fruits Fruit juice. Cooked, strained fruit. Meats and Other Protein Sources Fatty cuts of meat. Fried Sales executive or fried fish. Dairy Milk. Yogurt. Cream cheese. Sour cream. Beverages Soft drinks. Other Cakes and pastries. Butter and oils. The items listed above may not be a complete list of foods and beverages to avoid. Contact your dietitian for more information. WHAT ARE SOME TIPS FOR INCLUDING HIGH-FIBER FOODS IN MY DIET?  Eat a wide variety of high-fiber foods.  Make sure that half of all grains consumed each day are whole grains.  Replace breads and cereals made from refined flour or white flour with whole-grain breads and cereals.  Replace white rice with brown rice, bulgur wheat, or millet.  Start the day with a breakfast that is high in fiber, such as a cereal that contains at least 5 grams of fiber per serving.  Use beans in place of meat in soups, salads, or pasta.  Eat high-fiber snacks, such as berries, raw vegetables, nuts, or  popcorn.   This information is not intended to replace advice given to you by your health care provider. Make sure you discuss any questions you have with your health care provider.    Document Released: 10/19/2005 Document Revised: 11/09/2014 Document Reviewed: 04/03/2014 Elsevier Interactive Patient Education 2016 Elsevier Inc. Colonoscopy, Care After Refer to this sheet in the next few weeks. These instructions provide you with information on caring for yourself after your procedure. Your health care provider may also give you more specific instructions. Your treatment has been planned according to current medical practices, but problems sometimes occur. Call your health care provider if you have any problems or questions after your procedure. WHAT TO EXPECT AFTER THE PROCEDURE  After your procedure, it is typical to have the following:  A small amount of blood in your stool.  Moderate amounts of gas and mild abdominal cramping or bloating. HOME CARE INSTRUCTIONS  Do not drive, operate machinery, or sign important documents for 24 hours.  You may shower and resume your regular physical activities, but move at a slower pace for the first 24 hours.  Take frequent rest periods for the first 24 hours.  Walk around or put a warm pack on your abdomen to help reduce abdominal cramping and bloating.  Drink enough fluids to keep your urine clear or pale yellow.  You may resume your normal diet as instructed by your health care provider. Avoid heavy or fried foods that are hard to digest.  Avoid drinking alcohol for 24 hours or as instructed by your health care provider.  Only take over-the-counter or prescription medicines as directed by your health care provider.  If a tissue sample (biopsy) was taken during your procedure:  Do not take aspirin or blood thinners for 7 days, or as instructed by your health care provider.  Do not drink alcohol for 7 days, or as instructed by your health care provider.  Eat soft foods for the first 24 hours. SEEK MEDICAL CARE IF: You have persistent spotting of blood in your stool 2-3 days after the procedure. SEEK IMMEDIATE MEDICAL  CARE IF:  You have more than a small spotting of blood in your stool.  You pass large blood clots in your stool.  Your abdomen is swollen (distended).  You have nausea or vomiting.  You have a fever.  You have increasing abdominal pain that is not relieved with medicine.   This information is not intended to replace advice given to you by your health care provider. Make sure you discuss any questions you have with your health care provider.   Document Released: 06/02/2004 Document Revised: 08/09/2013 Document Reviewed: 06/26/2013 Elsevier Interactive Patient Education 2016 Elsevier Inc. Colon Polyps Polyps are lumps of extra tissue growing inside the body. Polyps can grow in the large intestine (colon). Most colon polyps are noncancerous (benign). However, some colon polyps can become cancerous over time. Polyps that are larger than a pea may be harmful. To be safe, caregivers remove and test all polyps. CAUSES  Polyps form when mutations in the genes cause your cells to grow and divide even though no more tissue is needed. RISK FACTORS There are a number of risk factors that can increase your chances of getting colon polyps. They include:  Being older than 50 years.  Family history of colon polyps or colon cancer.  Long-term colon diseases, such as colitis or Crohn disease.  Being overweight.  Smoking.  Being inactive.  Drinking too much alcohol. SYMPTOMS  Most  small polyps do not cause symptoms. If symptoms are present, they may include:  Blood in the stool. The stool may look dark red or black.  Constipation or diarrhea that lasts longer than 1 week. DIAGNOSIS People often do not know they have polyps until their caregiver finds them during a regular checkup. Your caregiver can use 4 tests to check for polyps:  Digital rectal exam. The caregiver wears gloves and feels inside the rectum. This test would find polyps only in the rectum.  Barium enema. The caregiver  puts a liquid called barium into your rectum before taking X-rays of your colon. Barium makes your colon look white. Polyps are dark, so they are easy to see in the X-ray pictures.  Sigmoidoscopy. A thin, flexible tube (sigmoidoscope) is placed into your rectum. The sigmoidoscope has a light and tiny camera in it. The caregiver uses the sigmoidoscope to look at the last third of your colon.  Colonoscopy. This test is like sigmoidoscopy, but the caregiver looks at the entire colon. This is the most common method for finding and removing polyps. TREATMENT  Any polyps will be removed during a sigmoidoscopy or colonoscopy. The polyps are then tested for cancer. PREVENTION  To help lower your risk of getting more colon polyps:  Eat plenty of fruits and vegetables. Avoid eating fatty foods.  Do not smoke.  Avoid drinking alcohol.  Exercise every day.  Lose weight if recommended by your caregiver.  Eat plenty of calcium and folate. Foods that are rich in calcium include milk, cheese, and broccoli. Foods that are rich in folate include chickpeas, kidney beans, and spinach. HOME CARE INSTRUCTIONS Keep all follow-up appointments as directed by your caregiver. You may need periodic exams to check for polyps. SEEK MEDICAL CARE IF: You notice bleeding during a bowel movement.   This information is not intended to replace advice given to you by your health care provider. Make sure you discuss any questions you have with your health care provider.   Document Released: 07/15/2004 Document Revised: 11/09/2014 Document Reviewed: 12/29/2011 Elsevier Interactive Patient Education [employer].

## 2015-09-16 DIAGNOSIS — Z79899 Other long term (current) drug therapy: Secondary | ICD-10-CM | POA: Diagnosis not present

## 2015-09-16 DIAGNOSIS — Z91018 Allergy to other foods: Secondary | ICD-10-CM | POA: Diagnosis not present

## 2015-09-16 DIAGNOSIS — Z91030 Bee allergy status: Secondary | ICD-10-CM | POA: Diagnosis not present

## 2015-09-16 DIAGNOSIS — M545 Low back pain: Secondary | ICD-10-CM | POA: Diagnosis not present

## 2015-09-16 DIAGNOSIS — G8929 Other chronic pain: Secondary | ICD-10-CM | POA: Diagnosis not present

## 2015-09-16 DIAGNOSIS — M47816 Spondylosis without myelopathy or radiculopathy, lumbar region: Secondary | ICD-10-CM | POA: Diagnosis not present

## 2015-09-16 DIAGNOSIS — Z87442 Personal history of urinary calculi: Secondary | ICD-10-CM | POA: Diagnosis not present

## 2015-09-16 DIAGNOSIS — Z885 Allergy status to narcotic agent status: Secondary | ICD-10-CM | POA: Diagnosis not present

## 2015-09-17 ENCOUNTER — Encounter (HOSPITAL_COMMUNITY): Payer: Self-pay | Admitting: Internal Medicine

## 2015-09-19 ENCOUNTER — Encounter (INDEPENDENT_AMBULATORY_CARE_PROVIDER_SITE_OTHER): Payer: Self-pay

## 2015-10-14 DIAGNOSIS — E7800 Pure hypercholesterolemia, unspecified: Secondary | ICD-10-CM | POA: Diagnosis not present

## 2015-10-14 DIAGNOSIS — N401 Enlarged prostate with lower urinary tract symptoms: Secondary | ICD-10-CM | POA: Diagnosis not present

## 2015-10-14 DIAGNOSIS — R5383 Other fatigue: Secondary | ICD-10-CM | POA: Diagnosis not present

## 2015-10-16 DIAGNOSIS — M25561 Pain in right knee: Secondary | ICD-10-CM | POA: Diagnosis not present

## 2015-10-16 DIAGNOSIS — M170 Bilateral primary osteoarthritis of knee: Secondary | ICD-10-CM | POA: Diagnosis not present

## 2015-10-16 DIAGNOSIS — M47816 Spondylosis without myelopathy or radiculopathy, lumbar region: Secondary | ICD-10-CM | POA: Diagnosis not present

## 2015-10-16 DIAGNOSIS — M25562 Pain in left knee: Secondary | ICD-10-CM | POA: Diagnosis not present

## 2015-10-21 DIAGNOSIS — N401 Enlarged prostate with lower urinary tract symptoms: Secondary | ICD-10-CM | POA: Diagnosis not present

## 2015-10-21 DIAGNOSIS — E6609 Other obesity due to excess calories: Secondary | ICD-10-CM | POA: Diagnosis not present

## 2015-10-21 DIAGNOSIS — M170 Bilateral primary osteoarthritis of knee: Secondary | ICD-10-CM | POA: Diagnosis not present

## 2015-10-21 DIAGNOSIS — R7301 Impaired fasting glucose: Secondary | ICD-10-CM | POA: Diagnosis not present

## 2015-10-21 DIAGNOSIS — M545 Low back pain: Secondary | ICD-10-CM | POA: Diagnosis not present

## 2015-10-21 DIAGNOSIS — K7581 Nonalcoholic steatohepatitis (NASH): Secondary | ICD-10-CM | POA: Diagnosis not present

## 2015-10-22 DIAGNOSIS — M47816 Spondylosis without myelopathy or radiculopathy, lumbar region: Secondary | ICD-10-CM | POA: Diagnosis not present

## 2015-11-08 DIAGNOSIS — M47816 Spondylosis without myelopathy or radiculopathy, lumbar region: Secondary | ICD-10-CM | POA: Diagnosis not present

## 2015-11-08 DIAGNOSIS — M25561 Pain in right knee: Secondary | ICD-10-CM | POA: Diagnosis not present

## 2015-11-08 DIAGNOSIS — M25562 Pain in left knee: Secondary | ICD-10-CM | POA: Diagnosis not present

## 2015-11-08 DIAGNOSIS — M170 Bilateral primary osteoarthritis of knee: Secondary | ICD-10-CM | POA: Diagnosis not present

## 2015-11-18 DIAGNOSIS — R05 Cough: Secondary | ICD-10-CM | POA: Diagnosis not present

## 2015-11-18 DIAGNOSIS — J209 Acute bronchitis, unspecified: Secondary | ICD-10-CM | POA: Diagnosis not present

## 2015-11-18 DIAGNOSIS — J069 Acute upper respiratory infection, unspecified: Secondary | ICD-10-CM | POA: Diagnosis not present

## 2015-11-18 DIAGNOSIS — Z01818 Encounter for other preprocedural examination: Secondary | ICD-10-CM | POA: Diagnosis not present

## 2015-11-21 DIAGNOSIS — Z6835 Body mass index (BMI) 35.0-35.9, adult: Secondary | ICD-10-CM | POA: Diagnosis not present

## 2015-11-21 DIAGNOSIS — K219 Gastro-esophageal reflux disease without esophagitis: Secondary | ICD-10-CM | POA: Diagnosis not present

## 2015-11-21 DIAGNOSIS — Z79899 Other long term (current) drug therapy: Secondary | ICD-10-CM | POA: Diagnosis not present

## 2015-11-21 DIAGNOSIS — M4326 Fusion of spine, lumbar region: Secondary | ICD-10-CM | POA: Diagnosis not present

## 2015-11-21 DIAGNOSIS — M5136 Other intervertebral disc degeneration, lumbar region: Secondary | ICD-10-CM | POA: Diagnosis not present

## 2015-11-21 DIAGNOSIS — Z885 Allergy status to narcotic agent status: Secondary | ICD-10-CM | POA: Diagnosis not present

## 2015-11-21 DIAGNOSIS — M47816 Spondylosis without myelopathy or radiculopathy, lumbar region: Secondary | ICD-10-CM | POA: Diagnosis not present

## 2015-11-21 DIAGNOSIS — Z981 Arthrodesis status: Secondary | ICD-10-CM | POA: Diagnosis not present

## 2015-11-21 DIAGNOSIS — E669 Obesity, unspecified: Secondary | ICD-10-CM | POA: Diagnosis not present

## 2015-11-21 DIAGNOSIS — M47896 Other spondylosis, lumbar region: Secondary | ICD-10-CM | POA: Diagnosis not present

## 2015-11-28 DIAGNOSIS — Z981 Arthrodesis status: Secondary | ICD-10-CM | POA: Diagnosis not present

## 2015-11-28 DIAGNOSIS — M4326 Fusion of spine, lumbar region: Secondary | ICD-10-CM | POA: Diagnosis not present

## 2015-11-28 DIAGNOSIS — M47816 Spondylosis without myelopathy or radiculopathy, lumbar region: Secondary | ICD-10-CM | POA: Diagnosis not present

## 2015-12-05 DIAGNOSIS — Z981 Arthrodesis status: Secondary | ICD-10-CM | POA: Diagnosis not present

## 2015-12-05 DIAGNOSIS — M9983 Other biomechanical lesions of lumbar region: Secondary | ICD-10-CM | POA: Diagnosis not present

## 2015-12-05 DIAGNOSIS — N200 Calculus of kidney: Secondary | ICD-10-CM | POA: Diagnosis not present

## 2015-12-05 DIAGNOSIS — M5126 Other intervertebral disc displacement, lumbar region: Secondary | ICD-10-CM | POA: Diagnosis not present

## 2015-12-10 DIAGNOSIS — M5136 Other intervertebral disc degeneration, lumbar region: Secondary | ICD-10-CM | POA: Diagnosis not present

## 2015-12-10 DIAGNOSIS — T849XXA Unspecified complication of internal orthopedic prosthetic device, implant and graft, initial encounter: Secondary | ICD-10-CM | POA: Diagnosis not present

## 2015-12-10 DIAGNOSIS — Z981 Arthrodesis status: Secondary | ICD-10-CM | POA: Diagnosis not present

## 2015-12-10 DIAGNOSIS — M47816 Spondylosis without myelopathy or radiculopathy, lumbar region: Secondary | ICD-10-CM | POA: Diagnosis not present

## 2015-12-10 DIAGNOSIS — K219 Gastro-esophageal reflux disease without esophagitis: Secondary | ICD-10-CM | POA: Diagnosis present

## 2015-12-10 DIAGNOSIS — Y793 Surgical instruments, materials and orthopedic devices (including sutures) associated with adverse incidents: Secondary | ICD-10-CM | POA: Diagnosis not present

## 2015-12-10 DIAGNOSIS — N400 Enlarged prostate without lower urinary tract symptoms: Secondary | ICD-10-CM | POA: Diagnosis present

## 2015-12-10 DIAGNOSIS — Z8249 Family history of ischemic heart disease and other diseases of the circulatory system: Secondary | ICD-10-CM | POA: Diagnosis not present

## 2015-12-10 DIAGNOSIS — Z800 Family history of malignant neoplasm of digestive organs: Secondary | ICD-10-CM | POA: Diagnosis not present

## 2015-12-10 DIAGNOSIS — Z91018 Allergy to other foods: Secondary | ICD-10-CM | POA: Diagnosis not present

## 2015-12-10 DIAGNOSIS — Z885 Allergy status to narcotic agent status: Secondary | ICD-10-CM | POA: Diagnosis not present

## 2015-12-10 DIAGNOSIS — T84226A Displacement of internal fixation device of vertebrae, initial encounter: Secondary | ICD-10-CM | POA: Diagnosis present

## 2015-12-10 DIAGNOSIS — Z91030 Bee allergy status: Secondary | ICD-10-CM | POA: Diagnosis not present

## 2015-12-17 DIAGNOSIS — M47816 Spondylosis without myelopathy or radiculopathy, lumbar region: Secondary | ICD-10-CM | POA: Diagnosis not present

## 2015-12-17 DIAGNOSIS — Z981 Arthrodesis status: Secondary | ICD-10-CM | POA: Diagnosis not present

## 2016-01-03 DIAGNOSIS — H81319 Aural vertigo, unspecified ear: Secondary | ICD-10-CM | POA: Diagnosis not present

## 2016-01-20 DIAGNOSIS — M25552 Pain in left hip: Secondary | ICD-10-CM | POA: Diagnosis not present

## 2016-01-20 DIAGNOSIS — M47816 Spondylosis without myelopathy or radiculopathy, lumbar region: Secondary | ICD-10-CM | POA: Diagnosis not present

## 2016-01-20 DIAGNOSIS — M4806 Spinal stenosis, lumbar region: Secondary | ICD-10-CM | POA: Diagnosis not present

## 2016-01-20 DIAGNOSIS — Z981 Arthrodesis status: Secondary | ICD-10-CM | POA: Diagnosis not present

## 2016-01-20 DIAGNOSIS — M9983 Other biomechanical lesions of lumbar region: Secondary | ICD-10-CM | POA: Diagnosis not present

## 2016-01-20 DIAGNOSIS — Z9889 Other specified postprocedural states: Secondary | ICD-10-CM | POA: Diagnosis not present

## 2016-01-29 DIAGNOSIS — Z01818 Encounter for other preprocedural examination: Secondary | ICD-10-CM | POA: Diagnosis not present

## 2016-01-29 DIAGNOSIS — Z87442 Personal history of urinary calculi: Secondary | ICD-10-CM | POA: Diagnosis not present

## 2016-01-29 DIAGNOSIS — Z79899 Other long term (current) drug therapy: Secondary | ICD-10-CM | POA: Diagnosis not present

## 2016-01-29 DIAGNOSIS — M5126 Other intervertebral disc displacement, lumbar region: Secondary | ICD-10-CM | POA: Diagnosis not present

## 2016-01-29 DIAGNOSIS — M47816 Spondylosis without myelopathy or radiculopathy, lumbar region: Secondary | ICD-10-CM | POA: Diagnosis not present

## 2016-02-03 DIAGNOSIS — R338 Other retention of urine: Secondary | ICD-10-CM | POA: Diagnosis not present

## 2016-02-03 DIAGNOSIS — M4326 Fusion of spine, lumbar region: Secondary | ICD-10-CM | POA: Diagnosis not present

## 2016-02-03 DIAGNOSIS — Z79891 Long term (current) use of opiate analgesic: Secondary | ICD-10-CM | POA: Diagnosis not present

## 2016-02-03 DIAGNOSIS — K219 Gastro-esophageal reflux disease without esophagitis: Secondary | ICD-10-CM | POA: Diagnosis present

## 2016-02-03 DIAGNOSIS — M47816 Spondylosis without myelopathy or radiculopathy, lumbar region: Secondary | ICD-10-CM | POA: Diagnosis not present

## 2016-02-03 DIAGNOSIS — M960 Pseudarthrosis after fusion or arthrodesis: Secondary | ICD-10-CM | POA: Diagnosis present

## 2016-02-03 DIAGNOSIS — N400 Enlarged prostate without lower urinary tract symptoms: Secondary | ICD-10-CM | POA: Diagnosis present

## 2016-02-03 DIAGNOSIS — M5126 Other intervertebral disc displacement, lumbar region: Secondary | ICD-10-CM | POA: Diagnosis not present

## 2016-02-03 DIAGNOSIS — Z800 Family history of malignant neoplasm of digestive organs: Secondary | ICD-10-CM | POA: Diagnosis not present

## 2016-02-03 DIAGNOSIS — Z886 Allergy status to analgesic agent status: Secondary | ICD-10-CM | POA: Diagnosis not present

## 2016-02-03 DIAGNOSIS — T84226A Displacement of internal fixation device of vertebrae, initial encounter: Secondary | ICD-10-CM | POA: Diagnosis not present

## 2016-02-03 DIAGNOSIS — Z8249 Family history of ischemic heart disease and other diseases of the circulatory system: Secondary | ICD-10-CM | POA: Diagnosis not present

## 2016-02-03 DIAGNOSIS — Z79899 Other long term (current) drug therapy: Secondary | ICD-10-CM | POA: Diagnosis not present

## 2016-02-11 DIAGNOSIS — M4326 Fusion of spine, lumbar region: Secondary | ICD-10-CM | POA: Diagnosis not present

## 2016-02-11 DIAGNOSIS — Z981 Arthrodesis status: Secondary | ICD-10-CM | POA: Diagnosis not present

## 2016-02-11 DIAGNOSIS — M47816 Spondylosis without myelopathy or radiculopathy, lumbar region: Secondary | ICD-10-CM | POA: Diagnosis not present

## 2016-03-03 DIAGNOSIS — M47816 Spondylosis without myelopathy or radiculopathy, lumbar region: Secondary | ICD-10-CM | POA: Diagnosis not present

## 2016-03-03 DIAGNOSIS — Z981 Arthrodesis status: Secondary | ICD-10-CM | POA: Diagnosis not present

## 2016-03-03 DIAGNOSIS — M4326 Fusion of spine, lumbar region: Secondary | ICD-10-CM | POA: Diagnosis not present

## 2016-03-10 DIAGNOSIS — R339 Retention of urine, unspecified: Secondary | ICD-10-CM | POA: Diagnosis not present

## 2016-03-10 DIAGNOSIS — R3915 Urgency of urination: Secondary | ICD-10-CM | POA: Diagnosis not present

## 2016-03-10 DIAGNOSIS — R351 Nocturia: Secondary | ICD-10-CM | POA: Diagnosis not present

## 2016-03-23 DIAGNOSIS — N62 Hypertrophy of breast: Secondary | ICD-10-CM | POA: Diagnosis not present

## 2016-03-23 DIAGNOSIS — N644 Mastodynia: Secondary | ICD-10-CM | POA: Diagnosis not present

## 2016-03-24 DIAGNOSIS — N3289 Other specified disorders of bladder: Secondary | ICD-10-CM | POA: Diagnosis not present

## 2016-03-24 DIAGNOSIS — R3915 Urgency of urination: Secondary | ICD-10-CM | POA: Diagnosis not present

## 2016-03-24 DIAGNOSIS — N329 Bladder disorder, unspecified: Secondary | ICD-10-CM | POA: Diagnosis not present

## 2016-03-24 DIAGNOSIS — R339 Retention of urine, unspecified: Secondary | ICD-10-CM | POA: Diagnosis not present

## 2016-04-08 DIAGNOSIS — M1990 Unspecified osteoarthritis, unspecified site: Secondary | ICD-10-CM | POA: Diagnosis not present

## 2016-04-08 DIAGNOSIS — R351 Nocturia: Secondary | ICD-10-CM | POA: Diagnosis not present

## 2016-04-08 DIAGNOSIS — R3912 Poor urinary stream: Secondary | ICD-10-CM | POA: Diagnosis not present

## 2016-04-08 DIAGNOSIS — R350 Frequency of micturition: Secondary | ICD-10-CM | POA: Diagnosis not present

## 2016-04-08 DIAGNOSIS — G43909 Migraine, unspecified, not intractable, without status migrainosus: Secondary | ICD-10-CM | POA: Diagnosis not present

## 2016-04-08 DIAGNOSIS — Z7982 Long term (current) use of aspirin: Secondary | ICD-10-CM | POA: Diagnosis not present

## 2016-04-08 DIAGNOSIS — N401 Enlarged prostate with lower urinary tract symptoms: Secondary | ICD-10-CM | POA: Diagnosis not present

## 2016-04-08 DIAGNOSIS — Z87442 Personal history of urinary calculi: Secondary | ICD-10-CM | POA: Diagnosis not present

## 2016-04-08 DIAGNOSIS — R3915 Urgency of urination: Secondary | ICD-10-CM | POA: Diagnosis not present

## 2016-04-08 DIAGNOSIS — Z79899 Other long term (current) drug therapy: Secondary | ICD-10-CM | POA: Diagnosis not present

## 2016-04-08 DIAGNOSIS — N329 Bladder disorder, unspecified: Secondary | ICD-10-CM | POA: Diagnosis not present

## 2016-04-08 DIAGNOSIS — Z86010 Personal history of colonic polyps: Secondary | ICD-10-CM | POA: Diagnosis not present

## 2016-04-08 DIAGNOSIS — Z91018 Allergy to other foods: Secondary | ICD-10-CM | POA: Diagnosis not present

## 2016-04-08 DIAGNOSIS — Z800 Family history of malignant neoplasm of digestive organs: Secondary | ICD-10-CM | POA: Diagnosis not present

## 2016-04-08 DIAGNOSIS — R339 Retention of urine, unspecified: Secondary | ICD-10-CM | POA: Diagnosis not present

## 2016-04-08 DIAGNOSIS — K589 Irritable bowel syndrome without diarrhea: Secondary | ICD-10-CM | POA: Diagnosis not present

## 2016-04-15 DIAGNOSIS — R7301 Impaired fasting glucose: Secondary | ICD-10-CM | POA: Diagnosis not present

## 2016-04-15 DIAGNOSIS — K7581 Nonalcoholic steatohepatitis (NASH): Secondary | ICD-10-CM | POA: Diagnosis not present

## 2016-04-15 DIAGNOSIS — E7800 Pure hypercholesterolemia, unspecified: Secondary | ICD-10-CM | POA: Diagnosis not present

## 2016-04-21 DIAGNOSIS — K7581 Nonalcoholic steatohepatitis (NASH): Secondary | ICD-10-CM | POA: Diagnosis not present

## 2016-04-21 DIAGNOSIS — R7301 Impaired fasting glucose: Secondary | ICD-10-CM | POA: Diagnosis not present

## 2016-04-21 DIAGNOSIS — N401 Enlarged prostate with lower urinary tract symptoms: Secondary | ICD-10-CM | POA: Diagnosis not present

## 2016-04-21 DIAGNOSIS — M170 Bilateral primary osteoarthritis of knee: Secondary | ICD-10-CM | POA: Diagnosis not present

## 2016-04-21 DIAGNOSIS — M545 Low back pain: Secondary | ICD-10-CM | POA: Diagnosis not present

## 2016-04-21 DIAGNOSIS — Z1389 Encounter for screening for other disorder: Secondary | ICD-10-CM | POA: Diagnosis not present

## 2016-05-07 DIAGNOSIS — M4326 Fusion of spine, lumbar region: Secondary | ICD-10-CM | POA: Diagnosis not present

## 2016-05-07 DIAGNOSIS — M5126 Other intervertebral disc displacement, lumbar region: Secondary | ICD-10-CM | POA: Diagnosis not present

## 2016-05-07 DIAGNOSIS — M47816 Spondylosis without myelopathy or radiculopathy, lumbar region: Secondary | ICD-10-CM | POA: Diagnosis not present

## 2016-05-07 DIAGNOSIS — Z981 Arthrodesis status: Secondary | ICD-10-CM | POA: Diagnosis not present

## 2016-05-18 DIAGNOSIS — S80861A Insect bite (nonvenomous), right lower leg, initial encounter: Secondary | ICD-10-CM | POA: Diagnosis not present

## 2016-05-18 DIAGNOSIS — Z6834 Body mass index (BMI) 34.0-34.9, adult: Secondary | ICD-10-CM | POA: Diagnosis not present

## 2016-06-04 DIAGNOSIS — S6991XA Unspecified injury of right wrist, hand and finger(s), initial encounter: Secondary | ICD-10-CM | POA: Diagnosis not present

## 2016-06-04 DIAGNOSIS — Z23 Encounter for immunization: Secondary | ICD-10-CM | POA: Diagnosis not present

## 2016-06-04 DIAGNOSIS — W312XXA Contact with powered woodworking and forming machines, initial encounter: Secondary | ICD-10-CM | POA: Diagnosis not present

## 2016-06-04 DIAGNOSIS — S61411A Laceration without foreign body of right hand, initial encounter: Secondary | ICD-10-CM | POA: Diagnosis not present

## 2016-06-13 DIAGNOSIS — Z791 Long term (current) use of non-steroidal anti-inflammatories (NSAID): Secondary | ICD-10-CM | POA: Diagnosis not present

## 2016-06-13 DIAGNOSIS — L03113 Cellulitis of right upper limb: Secondary | ICD-10-CM | POA: Diagnosis not present

## 2016-06-13 DIAGNOSIS — N400 Enlarged prostate without lower urinary tract symptoms: Secondary | ICD-10-CM | POA: Diagnosis not present

## 2016-06-13 DIAGNOSIS — Z79899 Other long term (current) drug therapy: Secondary | ICD-10-CM | POA: Diagnosis not present

## 2016-06-13 DIAGNOSIS — S61411D Laceration without foreign body of right hand, subsequent encounter: Secondary | ICD-10-CM | POA: Diagnosis not present

## 2016-06-16 ENCOUNTER — Ambulatory Visit (INDEPENDENT_AMBULATORY_CARE_PROVIDER_SITE_OTHER): Payer: Medicare Other | Admitting: Endocrinology

## 2016-06-16 ENCOUNTER — Encounter: Payer: Self-pay | Admitting: Endocrinology

## 2016-06-16 VITALS — BP 131/79 | HR 89 | Ht 76.0 in | Wt 281.0 lb

## 2016-06-16 DIAGNOSIS — R635 Abnormal weight gain: Secondary | ICD-10-CM

## 2016-06-16 DIAGNOSIS — R7301 Impaired fasting glucose: Secondary | ICD-10-CM | POA: Diagnosis not present

## 2016-06-16 DIAGNOSIS — N62 Hypertrophy of breast: Secondary | ICD-10-CM | POA: Diagnosis not present

## 2016-06-16 NOTE — Progress Notes (Signed)
Patient ID: Bruce Stewart, male   DOB: June 14, 1946, 70 y.o.   MRN: [id number]            Chief complaint:   Breast pain  History of Present Illness:    In 2/17 he noticed pain and discomfort in the breast area bilaterally associated with some swelling.  He was seen by his PCP and advised to stop the finasteride which he had been taking for a year and a half Not clear if he had any labs done to evaluate this He was seen in follow-up in 6/17 and because of persistent symptoms he has been referred here for further management However he thinks that his breast swelling and discomfort is getting better He has not had any other new medications recently and has not had any herbal supplements in the last few months.  Does not use any alcohol or recreational drugs. No history of any testicular disease or injury in the past  He has not had any change in his libido although he has had some erectile dysfunction, he thinks this is more since he has had low back pain and surgery as well as prostate issues  He also has had some tendency to weight gain this year especially with his back. He thinks his fasting glucose was slightly high at 105, further evaluation and labs not available   Past Medical History:  Diagnosis Date  . Anal fissure   . Diverticulitis   . Family hx of colon cancer   . Kidney stone     Past Surgical History:  Procedure Laterality Date  . back surgery x 2    . CIRCUMCISION    . COLONOSCOPY N/A 09/12/2015   Procedure: COLONOSCOPY;  Surgeon: Rogene Houston, MD;  Location: [facility];  Service: Endoscopy;  Laterality: N/A;  2:25-moved up to 64 Ann notified pt  . repair of anal fissure     spincterotomy    Family History  Problem Relation Age of Onset  . Colon cancer Father     Social History:  reports that he has never smoked. He does not have any smokeless tobacco history on file. He reports that he does not drink alcohol or use drugs.  Allergies:  Allergies    Allergen Reactions  . Codeine Itching  . Orange Fruit [Citrus] Itching  . Bee Venom Rash      Medication List       Accurate as of 06/16/16 11:10 AM. Always use your most recent med list.          acetaminophen 500 MG tablet Commonly known as:  TYLENOL Take 500 mg by mouth every 6 (six) hours as needed.   diphenhydramine-acetaminophen 25-500 MG Tabs tablet Commonly known as:  TYLENOL PM Take 2 tablets by mouth at bedtime as needed (sleep).   finasteride 5 MG tablet Commonly known as:  PROSCAR Take 5 mg by mouth daily.   FISH OIL + D3 PO Take by mouth 2 (two) times daily.   ibuprofen 200 MG tablet Commonly known as:  ADVIL,MOTRIN Take 600 mg by mouth daily as needed for moderate pain.   multivitamin with minerals Tabs tablet Take 1 tablet by mouth daily.   tamsulosin 0.4 MG Caps capsule Commonly known as:  FLOMAX Take 0.4 mg by mouth daily.      Hair nail vit  LABS:  No visits with results within 1 Week(s) from this visit.  Latest known visit with results is:  No results found for  any previous visit.        Review of Systems  Constitutional: Positive for weight gain.  HENT: Negative for headaches.   Eyes: Negative for blurred vision.  Respiratory: Negative for shortness of breath.   Cardiovascular: Negative for chest pain and leg swelling.  Gastrointestinal: Negative for abdominal pain.  Endocrine: Positive for erectile dysfunction and breast swelling. Negative for fatigue and decreased libido.  Genitourinary: Positive for nocturia.  Musculoskeletal: Positive for back pain.  Skin: Negative for rash.  Neurological: Negative for numbness.     PHYSICAL EXAM:  BP 131/79   Pulse 89   Ht 6\' 4"  (1.93 m)   Wt 281 lb (127.5 kg)   SpO2 95%   BMI 34.20 kg/m   GENERAL: Heavy set, mildly generalized obesity present   No pallor, clubbing, lymphadenopathy or edema.   Skin:  no rash or pigmentation.  EYES:  Externally normal.  Fundii:  normal discs  .  ENT: Oral mucosa and tongue normal.  THYROID:  Just palpable on swallowing on the right side, smooth.  HEART:  Normal  S1 and S2; no murmur or click.  CHEST:  He has very mild gynecomastia with breast tissue firm, smooth and nontender measuring about 2.5-3 cm across  Normal shape.  Lungs: Vescicular breath sounds heard equally.  No crepitations/ wheeze.  ABDOMEN:  No distention.  Liver and spleen not palpable.  No other mass or tenderness.  Hydrocele present on the left side, testicle difficult to palpate but appears smaller in size.  Right testicle is about 3 cm across  NEUROLOGICAL: .Reflexes are normal to reduced at biceps bilaterally  JOINTS:  Normal.   ASSESSMENT:    Gynecomastia of recent onset with associated local pain.  It is bilateral and possibly related to his taking finasteride which he stopped a few months ago with some improvement in symptoms.  However need to rule out any underlying low testosterone levels.  He does not have typical symptoms of hypogonadism and his mild erectile dysfunction may be unrelated  Obesity and reportedly history of impaired fasting glucose  Prominent right thyroid lobe on exam   PLAN:    He will have fasting free testosterone, prolactin and LH levels.  Also will need to review labs done by PCP already Further management may depend on results of his labs, if he has normal testosterone levels no further workup may be needed  Since he reportedly has history of impaired fasting glucose will also recheck glucose levels  Adebayo Ensminger 06/16/2016, 11:10 AM

## 2016-06-17 DIAGNOSIS — R635 Abnormal weight gain: Secondary | ICD-10-CM | POA: Diagnosis not present

## 2016-06-17 DIAGNOSIS — N62 Hypertrophy of breast: Secondary | ICD-10-CM | POA: Diagnosis not present

## 2016-06-17 DIAGNOSIS — R7301 Impaired fasting glucose: Secondary | ICD-10-CM | POA: Diagnosis not present

## 2016-06-26 ENCOUNTER — Other Ambulatory Visit: Payer: Self-pay

## 2016-07-02 ENCOUNTER — Telehealth: Payer: Self-pay | Admitting: Endocrinology

## 2016-07-02 NOTE — Telephone Encounter (Signed)
Pt had the labs we requested done in [place] on 06/17/16, have we received the fax of the results?

## 2016-07-07 ENCOUNTER — Telehealth: Payer: Self-pay

## 2016-07-07 NOTE — Telephone Encounter (Signed)
Called PCP office, and in the process of getting the lab faxed over, attn to me, I will give to Dr.Kumar when they come through fax.

## 2016-07-08 ENCOUNTER — Telehealth: Payer: Self-pay | Admitting: Endocrinology

## 2016-07-08 NOTE — Telephone Encounter (Signed)
Labs received from PCP done at [place] on 06/17/16 showed normal free testosterone, sugar and TSH, minimally increased prolactin of 18.  Please let patient know that that did not show any significant hormonal abnormality, to be seen as needed only

## 2016-07-08 NOTE — Telephone Encounter (Signed)
Patient said he wants to discuss with you the fact that his pecs are still sore, he said even those his labs were normal he's still in pain.

## 2016-07-08 NOTE — Telephone Encounter (Signed)
Do not have any medication for sore breasts.  He needs to discuss with PCP

## 2016-07-08 NOTE — Telephone Encounter (Signed)
Called pt and left message to follow up with pcp

## 2016-07-14 DIAGNOSIS — M9903 Segmental and somatic dysfunction of lumbar region: Secondary | ICD-10-CM | POA: Diagnosis not present

## 2016-07-14 DIAGNOSIS — S338XXA Sprain of other parts of lumbar spine and pelvis, initial encounter: Secondary | ICD-10-CM | POA: Diagnosis not present

## 2016-07-14 DIAGNOSIS — M47816 Spondylosis without myelopathy or radiculopathy, lumbar region: Secondary | ICD-10-CM | POA: Diagnosis not present

## 2016-07-15 DIAGNOSIS — S338XXA Sprain of other parts of lumbar spine and pelvis, initial encounter: Secondary | ICD-10-CM | POA: Diagnosis not present

## 2016-07-15 DIAGNOSIS — M9903 Segmental and somatic dysfunction of lumbar region: Secondary | ICD-10-CM | POA: Diagnosis not present

## 2016-07-15 DIAGNOSIS — M47816 Spondylosis without myelopathy or radiculopathy, lumbar region: Secondary | ICD-10-CM | POA: Diagnosis not present

## 2016-07-17 DIAGNOSIS — M47816 Spondylosis without myelopathy or radiculopathy, lumbar region: Secondary | ICD-10-CM | POA: Diagnosis not present

## 2016-07-17 DIAGNOSIS — S338XXA Sprain of other parts of lumbar spine and pelvis, initial encounter: Secondary | ICD-10-CM | POA: Diagnosis not present

## 2016-07-17 DIAGNOSIS — M9903 Segmental and somatic dysfunction of lumbar region: Secondary | ICD-10-CM | POA: Diagnosis not present

## 2016-07-20 DIAGNOSIS — S338XXA Sprain of other parts of lumbar spine and pelvis, initial encounter: Secondary | ICD-10-CM | POA: Diagnosis not present

## 2016-07-20 DIAGNOSIS — M47816 Spondylosis without myelopathy or radiculopathy, lumbar region: Secondary | ICD-10-CM | POA: Diagnosis not present

## 2016-07-20 DIAGNOSIS — M9903 Segmental and somatic dysfunction of lumbar region: Secondary | ICD-10-CM | POA: Diagnosis not present

## 2016-07-22 DIAGNOSIS — S338XXA Sprain of other parts of lumbar spine and pelvis, initial encounter: Secondary | ICD-10-CM | POA: Diagnosis not present

## 2016-07-22 DIAGNOSIS — M7071 Other bursitis of hip, right hip: Secondary | ICD-10-CM | POA: Diagnosis not present

## 2016-07-22 DIAGNOSIS — M9903 Segmental and somatic dysfunction of lumbar region: Secondary | ICD-10-CM | POA: Diagnosis not present

## 2016-07-22 DIAGNOSIS — M47816 Spondylosis without myelopathy or radiculopathy, lumbar region: Secondary | ICD-10-CM | POA: Diagnosis not present

## 2016-07-24 DIAGNOSIS — M47816 Spondylosis without myelopathy or radiculopathy, lumbar region: Secondary | ICD-10-CM | POA: Diagnosis not present

## 2016-07-24 DIAGNOSIS — M5126 Other intervertebral disc displacement, lumbar region: Secondary | ICD-10-CM | POA: Diagnosis not present

## 2016-07-24 DIAGNOSIS — Z981 Arthrodesis status: Secondary | ICD-10-CM | POA: Diagnosis not present

## 2016-07-28 DIAGNOSIS — M9903 Segmental and somatic dysfunction of lumbar region: Secondary | ICD-10-CM | POA: Diagnosis not present

## 2016-07-28 DIAGNOSIS — S338XXA Sprain of other parts of lumbar spine and pelvis, initial encounter: Secondary | ICD-10-CM | POA: Diagnosis not present

## 2016-07-28 DIAGNOSIS — M47816 Spondylosis without myelopathy or radiculopathy, lumbar region: Secondary | ICD-10-CM | POA: Diagnosis not present

## 2016-07-29 DIAGNOSIS — M7071 Other bursitis of hip, right hip: Secondary | ICD-10-CM | POA: Diagnosis not present

## 2016-07-29 DIAGNOSIS — M47816 Spondylosis without myelopathy or radiculopathy, lumbar region: Secondary | ICD-10-CM | POA: Diagnosis not present

## 2016-07-29 DIAGNOSIS — M5126 Other intervertebral disc displacement, lumbar region: Secondary | ICD-10-CM | POA: Diagnosis not present

## 2016-07-31 DIAGNOSIS — M9903 Segmental and somatic dysfunction of lumbar region: Secondary | ICD-10-CM | POA: Diagnosis not present

## 2016-07-31 DIAGNOSIS — S338XXA Sprain of other parts of lumbar spine and pelvis, initial encounter: Secondary | ICD-10-CM | POA: Diagnosis not present

## 2016-07-31 DIAGNOSIS — M47816 Spondylosis without myelopathy or radiculopathy, lumbar region: Secondary | ICD-10-CM | POA: Diagnosis not present

## 2016-08-03 DIAGNOSIS — M9903 Segmental and somatic dysfunction of lumbar region: Secondary | ICD-10-CM | POA: Diagnosis not present

## 2016-08-03 DIAGNOSIS — M47816 Spondylosis without myelopathy or radiculopathy, lumbar region: Secondary | ICD-10-CM | POA: Diagnosis not present

## 2016-08-03 DIAGNOSIS — S338XXA Sprain of other parts of lumbar spine and pelvis, initial encounter: Secondary | ICD-10-CM | POA: Diagnosis not present

## 2016-08-05 DIAGNOSIS — S338XXA Sprain of other parts of lumbar spine and pelvis, initial encounter: Secondary | ICD-10-CM | POA: Diagnosis not present

## 2016-08-05 DIAGNOSIS — M47816 Spondylosis without myelopathy or radiculopathy, lumbar region: Secondary | ICD-10-CM | POA: Diagnosis not present

## 2016-08-05 DIAGNOSIS — M9903 Segmental and somatic dysfunction of lumbar region: Secondary | ICD-10-CM | POA: Diagnosis not present

## 2016-08-07 DIAGNOSIS — M9903 Segmental and somatic dysfunction of lumbar region: Secondary | ICD-10-CM | POA: Diagnosis not present

## 2016-08-07 DIAGNOSIS — M47816 Spondylosis without myelopathy or radiculopathy, lumbar region: Secondary | ICD-10-CM | POA: Diagnosis not present

## 2016-08-07 DIAGNOSIS — S338XXA Sprain of other parts of lumbar spine and pelvis, initial encounter: Secondary | ICD-10-CM | POA: Diagnosis not present

## 2016-08-10 DIAGNOSIS — M9903 Segmental and somatic dysfunction of lumbar region: Secondary | ICD-10-CM | POA: Diagnosis not present

## 2016-08-10 DIAGNOSIS — S338XXA Sprain of other parts of lumbar spine and pelvis, initial encounter: Secondary | ICD-10-CM | POA: Diagnosis not present

## 2016-08-10 DIAGNOSIS — M47816 Spondylosis without myelopathy or radiculopathy, lumbar region: Secondary | ICD-10-CM | POA: Diagnosis not present

## 2016-08-12 DIAGNOSIS — M47816 Spondylosis without myelopathy or radiculopathy, lumbar region: Secondary | ICD-10-CM | POA: Diagnosis not present

## 2016-08-12 DIAGNOSIS — M9903 Segmental and somatic dysfunction of lumbar region: Secondary | ICD-10-CM | POA: Diagnosis not present

## 2016-08-12 DIAGNOSIS — S338XXA Sprain of other parts of lumbar spine and pelvis, initial encounter: Secondary | ICD-10-CM | POA: Diagnosis not present

## 2016-08-17 DIAGNOSIS — M9903 Segmental and somatic dysfunction of lumbar region: Secondary | ICD-10-CM | POA: Diagnosis not present

## 2016-08-17 DIAGNOSIS — M47816 Spondylosis without myelopathy or radiculopathy, lumbar region: Secondary | ICD-10-CM | POA: Diagnosis not present

## 2016-08-17 DIAGNOSIS — S338XXA Sprain of other parts of lumbar spine and pelvis, initial encounter: Secondary | ICD-10-CM | POA: Diagnosis not present

## 2016-08-21 DIAGNOSIS — M9903 Segmental and somatic dysfunction of lumbar region: Secondary | ICD-10-CM | POA: Diagnosis not present

## 2016-08-21 DIAGNOSIS — M47816 Spondylosis without myelopathy or radiculopathy, lumbar region: Secondary | ICD-10-CM | POA: Diagnosis not present

## 2016-08-21 DIAGNOSIS — S338XXA Sprain of other parts of lumbar spine and pelvis, initial encounter: Secondary | ICD-10-CM | POA: Diagnosis not present

## 2016-08-21 DIAGNOSIS — R7301 Impaired fasting glucose: Secondary | ICD-10-CM | POA: Diagnosis not present

## 2016-08-21 DIAGNOSIS — E7800 Pure hypercholesterolemia, unspecified: Secondary | ICD-10-CM | POA: Diagnosis not present

## 2016-08-21 DIAGNOSIS — K5732 Diverticulitis of large intestine without perforation or abscess without bleeding: Secondary | ICD-10-CM | POA: Diagnosis not present

## 2016-08-21 DIAGNOSIS — K7581 Nonalcoholic steatohepatitis (NASH): Secondary | ICD-10-CM | POA: Diagnosis not present

## 2016-08-24 DIAGNOSIS — S338XXA Sprain of other parts of lumbar spine and pelvis, initial encounter: Secondary | ICD-10-CM | POA: Diagnosis not present

## 2016-08-24 DIAGNOSIS — M47816 Spondylosis without myelopathy or radiculopathy, lumbar region: Secondary | ICD-10-CM | POA: Diagnosis not present

## 2016-08-24 DIAGNOSIS — M9903 Segmental and somatic dysfunction of lumbar region: Secondary | ICD-10-CM | POA: Diagnosis not present

## 2016-08-25 DIAGNOSIS — M545 Low back pain: Secondary | ICD-10-CM | POA: Diagnosis not present

## 2016-08-25 DIAGNOSIS — E7800 Pure hypercholesterolemia, unspecified: Secondary | ICD-10-CM | POA: Diagnosis not present

## 2016-08-25 DIAGNOSIS — M170 Bilateral primary osteoarthritis of knee: Secondary | ICD-10-CM | POA: Diagnosis not present

## 2016-08-25 DIAGNOSIS — Z6834 Body mass index (BMI) 34.0-34.9, adult: Secondary | ICD-10-CM | POA: Diagnosis not present

## 2016-08-25 DIAGNOSIS — Z23 Encounter for immunization: Secondary | ICD-10-CM | POA: Diagnosis not present

## 2016-08-25 DIAGNOSIS — R7301 Impaired fasting glucose: Secondary | ICD-10-CM | POA: Diagnosis not present

## 2016-08-25 DIAGNOSIS — N401 Enlarged prostate with lower urinary tract symptoms: Secondary | ICD-10-CM | POA: Diagnosis not present

## 2016-08-26 DIAGNOSIS — S338XXA Sprain of other parts of lumbar spine and pelvis, initial encounter: Secondary | ICD-10-CM | POA: Diagnosis not present

## 2016-08-26 DIAGNOSIS — M47816 Spondylosis without myelopathy or radiculopathy, lumbar region: Secondary | ICD-10-CM | POA: Diagnosis not present

## 2016-08-26 DIAGNOSIS — M9903 Segmental and somatic dysfunction of lumbar region: Secondary | ICD-10-CM | POA: Diagnosis not present

## 2016-08-31 DIAGNOSIS — S338XXA Sprain of other parts of lumbar spine and pelvis, initial encounter: Secondary | ICD-10-CM | POA: Diagnosis not present

## 2016-08-31 DIAGNOSIS — M47816 Spondylosis without myelopathy or radiculopathy, lumbar region: Secondary | ICD-10-CM | POA: Diagnosis not present

## 2016-08-31 DIAGNOSIS — M9903 Segmental and somatic dysfunction of lumbar region: Secondary | ICD-10-CM | POA: Diagnosis not present

## 2016-09-07 DIAGNOSIS — M47816 Spondylosis without myelopathy or radiculopathy, lumbar region: Secondary | ICD-10-CM | POA: Diagnosis not present

## 2016-09-07 DIAGNOSIS — M9903 Segmental and somatic dysfunction of lumbar region: Secondary | ICD-10-CM | POA: Diagnosis not present

## 2016-09-07 DIAGNOSIS — S338XXA Sprain of other parts of lumbar spine and pelvis, initial encounter: Secondary | ICD-10-CM | POA: Diagnosis not present

## 2016-09-10 DIAGNOSIS — M170 Bilateral primary osteoarthritis of knee: Secondary | ICD-10-CM | POA: Diagnosis not present

## 2016-09-10 DIAGNOSIS — M25562 Pain in left knee: Secondary | ICD-10-CM | POA: Diagnosis not present

## 2016-09-10 DIAGNOSIS — M25561 Pain in right knee: Secondary | ICD-10-CM | POA: Diagnosis not present

## 2016-09-14 DIAGNOSIS — M25561 Pain in right knee: Secondary | ICD-10-CM | POA: Diagnosis not present

## 2016-09-15 DIAGNOSIS — M25562 Pain in left knee: Secondary | ICD-10-CM | POA: Diagnosis not present

## 2016-09-15 DIAGNOSIS — M25561 Pain in right knee: Secondary | ICD-10-CM | POA: Diagnosis not present

## 2016-09-15 DIAGNOSIS — M170 Bilateral primary osteoarthritis of knee: Secondary | ICD-10-CM | POA: Diagnosis not present

## 2016-09-15 DIAGNOSIS — M47816 Spondylosis without myelopathy or radiculopathy, lumbar region: Secondary | ICD-10-CM | POA: Diagnosis not present

## 2016-09-15 DIAGNOSIS — M5126 Other intervertebral disc displacement, lumbar region: Secondary | ICD-10-CM | POA: Diagnosis not present

## 2016-09-21 DIAGNOSIS — L820 Inflamed seborrheic keratosis: Secondary | ICD-10-CM | POA: Diagnosis not present

## 2016-09-21 DIAGNOSIS — D225 Melanocytic nevi of trunk: Secondary | ICD-10-CM | POA: Diagnosis not present

## 2016-09-21 DIAGNOSIS — M9903 Segmental and somatic dysfunction of lumbar region: Secondary | ICD-10-CM | POA: Diagnosis not present

## 2016-09-21 DIAGNOSIS — S338XXA Sprain of other parts of lumbar spine and pelvis, initial encounter: Secondary | ICD-10-CM | POA: Diagnosis not present

## 2016-09-21 DIAGNOSIS — M47816 Spondylosis without myelopathy or radiculopathy, lumbar region: Secondary | ICD-10-CM | POA: Diagnosis not present

## 2016-09-22 DIAGNOSIS — M25561 Pain in right knee: Secondary | ICD-10-CM | POA: Diagnosis not present

## 2016-09-22 DIAGNOSIS — M1711 Unilateral primary osteoarthritis, right knee: Secondary | ICD-10-CM | POA: Diagnosis not present

## 2016-09-23 DIAGNOSIS — M1712 Unilateral primary osteoarthritis, left knee: Secondary | ICD-10-CM | POA: Diagnosis not present

## 2016-09-23 DIAGNOSIS — M25562 Pain in left knee: Secondary | ICD-10-CM | POA: Diagnosis not present

## 2016-09-29 DIAGNOSIS — M1711 Unilateral primary osteoarthritis, right knee: Secondary | ICD-10-CM | POA: Diagnosis not present

## 2016-09-29 DIAGNOSIS — M25561 Pain in right knee: Secondary | ICD-10-CM | POA: Diagnosis not present

## 2016-10-01 DIAGNOSIS — M25562 Pain in left knee: Secondary | ICD-10-CM | POA: Diagnosis not present

## 2016-10-01 DIAGNOSIS — M1712 Unilateral primary osteoarthritis, left knee: Secondary | ICD-10-CM | POA: Diagnosis not present

## 2016-10-05 DIAGNOSIS — M25561 Pain in right knee: Secondary | ICD-10-CM | POA: Diagnosis not present

## 2016-10-05 DIAGNOSIS — M9903 Segmental and somatic dysfunction of lumbar region: Secondary | ICD-10-CM | POA: Diagnosis not present

## 2016-10-05 DIAGNOSIS — S338XXA Sprain of other parts of lumbar spine and pelvis, initial encounter: Secondary | ICD-10-CM | POA: Diagnosis not present

## 2016-10-05 DIAGNOSIS — M47816 Spondylosis without myelopathy or radiculopathy, lumbar region: Secondary | ICD-10-CM | POA: Diagnosis not present

## 2016-10-05 DIAGNOSIS — M1711 Unilateral primary osteoarthritis, right knee: Secondary | ICD-10-CM | POA: Diagnosis not present

## 2016-10-08 DIAGNOSIS — M25562 Pain in left knee: Secondary | ICD-10-CM | POA: Diagnosis not present

## 2016-10-08 DIAGNOSIS — M1712 Unilateral primary osteoarthritis, left knee: Secondary | ICD-10-CM | POA: Diagnosis not present

## 2016-10-15 DIAGNOSIS — M25562 Pain in left knee: Secondary | ICD-10-CM | POA: Diagnosis not present

## 2016-10-15 DIAGNOSIS — M170 Bilateral primary osteoarthritis of knee: Secondary | ICD-10-CM | POA: Diagnosis not present

## 2016-10-15 DIAGNOSIS — M25561 Pain in right knee: Secondary | ICD-10-CM | POA: Diagnosis not present

## 2016-11-06 DIAGNOSIS — Z6834 Body mass index (BMI) 34.0-34.9, adult: Secondary | ICD-10-CM | POA: Diagnosis not present

## 2016-11-06 DIAGNOSIS — S42401A Unspecified fracture of lower end of right humerus, initial encounter for closed fracture: Secondary | ICD-10-CM | POA: Diagnosis not present

## 2016-11-17 DIAGNOSIS — M7022 Olecranon bursitis, left elbow: Secondary | ICD-10-CM | POA: Diagnosis not present

## 2016-12-09 DIAGNOSIS — Z981 Arthrodesis status: Secondary | ICD-10-CM | POA: Diagnosis not present

## 2016-12-09 DIAGNOSIS — T84226D Displacement of internal fixation device of vertebrae, subsequent encounter: Secondary | ICD-10-CM | POA: Diagnosis not present

## 2016-12-09 DIAGNOSIS — M47816 Spondylosis without myelopathy or radiculopathy, lumbar region: Secondary | ICD-10-CM | POA: Diagnosis not present

## 2016-12-22 DIAGNOSIS — M5126 Other intervertebral disc displacement, lumbar region: Secondary | ICD-10-CM | POA: Diagnosis not present

## 2016-12-22 DIAGNOSIS — M47816 Spondylosis without myelopathy or radiculopathy, lumbar region: Secondary | ICD-10-CM | POA: Diagnosis not present

## 2016-12-30 DIAGNOSIS — M47816 Spondylosis without myelopathy or radiculopathy, lumbar region: Secondary | ICD-10-CM | POA: Diagnosis not present

## 2017-01-05 DIAGNOSIS — M47816 Spondylosis without myelopathy or radiculopathy, lumbar region: Secondary | ICD-10-CM | POA: Diagnosis not present

## 2017-01-07 DIAGNOSIS — M47816 Spondylosis without myelopathy or radiculopathy, lumbar region: Secondary | ICD-10-CM | POA: Diagnosis not present

## 2017-01-12 DIAGNOSIS — M47816 Spondylosis without myelopathy or radiculopathy, lumbar region: Secondary | ICD-10-CM | POA: Diagnosis not present

## 2017-01-14 DIAGNOSIS — M47816 Spondylosis without myelopathy or radiculopathy, lumbar region: Secondary | ICD-10-CM | POA: Diagnosis not present

## 2017-01-19 DIAGNOSIS — M47816 Spondylosis without myelopathy or radiculopathy, lumbar region: Secondary | ICD-10-CM | POA: Diagnosis not present

## 2017-01-21 DIAGNOSIS — M47816 Spondylosis without myelopathy or radiculopathy, lumbar region: Secondary | ICD-10-CM | POA: Diagnosis not present

## 2017-01-26 DIAGNOSIS — M47816 Spondylosis without myelopathy or radiculopathy, lumbar region: Secondary | ICD-10-CM | POA: Diagnosis not present

## 2017-01-26 DIAGNOSIS — Z6834 Body mass index (BMI) 34.0-34.9, adult: Secondary | ICD-10-CM | POA: Diagnosis not present

## 2017-01-26 DIAGNOSIS — J209 Acute bronchitis, unspecified: Secondary | ICD-10-CM | POA: Diagnosis not present

## 2017-01-26 DIAGNOSIS — R05 Cough: Secondary | ICD-10-CM | POA: Diagnosis not present

## 2017-02-02 DIAGNOSIS — M47816 Spondylosis without myelopathy or radiculopathy, lumbar region: Secondary | ICD-10-CM | POA: Diagnosis not present

## 2017-02-04 DIAGNOSIS — M5126 Other intervertebral disc displacement, lumbar region: Secondary | ICD-10-CM | POA: Diagnosis not present

## 2017-02-04 DIAGNOSIS — M47816 Spondylosis without myelopathy or radiculopathy, lumbar region: Secondary | ICD-10-CM | POA: Diagnosis not present

## 2017-02-09 DIAGNOSIS — M47816 Spondylosis without myelopathy or radiculopathy, lumbar region: Secondary | ICD-10-CM | POA: Diagnosis not present

## 2017-02-23 DIAGNOSIS — M47816 Spondylosis without myelopathy or radiculopathy, lumbar region: Secondary | ICD-10-CM | POA: Diagnosis not present

## 2017-02-25 DIAGNOSIS — M47816 Spondylosis without myelopathy or radiculopathy, lumbar region: Secondary | ICD-10-CM | POA: Diagnosis not present

## 2017-03-02 DIAGNOSIS — M47816 Spondylosis without myelopathy or radiculopathy, lumbar region: Secondary | ICD-10-CM | POA: Diagnosis not present

## 2017-03-09 DIAGNOSIS — N183 Chronic kidney disease, stage 3 (moderate): Secondary | ICD-10-CM | POA: Diagnosis not present

## 2017-03-09 DIAGNOSIS — L570 Actinic keratosis: Secondary | ICD-10-CM | POA: Diagnosis not present

## 2017-03-09 DIAGNOSIS — M47816 Spondylosis without myelopathy or radiculopathy, lumbar region: Secondary | ICD-10-CM | POA: Diagnosis not present

## 2017-03-09 DIAGNOSIS — E7800 Pure hypercholesterolemia, unspecified: Secondary | ICD-10-CM | POA: Diagnosis not present

## 2017-03-11 DIAGNOSIS — E6609 Other obesity due to excess calories: Secondary | ICD-10-CM | POA: Diagnosis not present

## 2017-03-11 DIAGNOSIS — E7800 Pure hypercholesterolemia, unspecified: Secondary | ICD-10-CM | POA: Diagnosis not present

## 2017-03-11 DIAGNOSIS — R7301 Impaired fasting glucose: Secondary | ICD-10-CM | POA: Diagnosis not present

## 2017-03-11 DIAGNOSIS — N644 Mastodynia: Secondary | ICD-10-CM | POA: Diagnosis not present

## 2017-03-11 DIAGNOSIS — M47816 Spondylosis without myelopathy or radiculopathy, lumbar region: Secondary | ICD-10-CM | POA: Diagnosis not present

## 2017-03-11 DIAGNOSIS — N401 Enlarged prostate with lower urinary tract symptoms: Secondary | ICD-10-CM | POA: Diagnosis not present

## 2017-03-11 DIAGNOSIS — M170 Bilateral primary osteoarthritis of knee: Secondary | ICD-10-CM | POA: Diagnosis not present

## 2017-03-11 DIAGNOSIS — K7581 Nonalcoholic steatohepatitis (NASH): Secondary | ICD-10-CM | POA: Diagnosis not present

## 2017-03-11 DIAGNOSIS — M545 Low back pain: Secondary | ICD-10-CM | POA: Diagnosis not present

## 2017-03-16 DIAGNOSIS — M47816 Spondylosis without myelopathy or radiculopathy, lumbar region: Secondary | ICD-10-CM | POA: Diagnosis not present

## 2017-03-25 DIAGNOSIS — M47817 Spondylosis without myelopathy or radiculopathy, lumbosacral region: Secondary | ICD-10-CM | POA: Diagnosis not present

## 2017-03-25 DIAGNOSIS — M533 Sacrococcygeal disorders, not elsewhere classified: Secondary | ICD-10-CM | POA: Diagnosis not present

## 2017-03-25 DIAGNOSIS — M461 Sacroiliitis, not elsewhere classified: Secondary | ICD-10-CM | POA: Diagnosis not present

## 2017-03-26 DIAGNOSIS — M47816 Spondylosis without myelopathy or radiculopathy, lumbar region: Secondary | ICD-10-CM | POA: Diagnosis not present

## 2017-04-01 DIAGNOSIS — M47816 Spondylosis without myelopathy or radiculopathy, lumbar region: Secondary | ICD-10-CM | POA: Diagnosis not present

## 2017-04-05 DIAGNOSIS — M47816 Spondylosis without myelopathy or radiculopathy, lumbar region: Secondary | ICD-10-CM | POA: Diagnosis not present

## 2017-04-07 DIAGNOSIS — M5126 Other intervertebral disc displacement, lumbar region: Secondary | ICD-10-CM | POA: Diagnosis not present

## 2017-04-07 DIAGNOSIS — M47816 Spondylosis without myelopathy or radiculopathy, lumbar region: Secondary | ICD-10-CM | POA: Diagnosis not present

## 2017-04-08 DIAGNOSIS — M47816 Spondylosis without myelopathy or radiculopathy, lumbar region: Secondary | ICD-10-CM | POA: Diagnosis not present

## 2017-04-15 DIAGNOSIS — M47816 Spondylosis without myelopathy or radiculopathy, lumbar region: Secondary | ICD-10-CM | POA: Diagnosis not present

## 2017-04-20 DIAGNOSIS — M47816 Spondylosis without myelopathy or radiculopathy, lumbar region: Secondary | ICD-10-CM | POA: Diagnosis not present

## 2017-04-27 DIAGNOSIS — M47816 Spondylosis without myelopathy or radiculopathy, lumbar region: Secondary | ICD-10-CM | POA: Diagnosis not present

## 2017-04-29 DIAGNOSIS — M47816 Spondylosis without myelopathy or radiculopathy, lumbar region: Secondary | ICD-10-CM | POA: Diagnosis not present

## 2017-06-09 ENCOUNTER — Other Ambulatory Visit: Payer: Self-pay | Admitting: Neurosurgery

## 2017-06-09 DIAGNOSIS — M47816 Spondylosis without myelopathy or radiculopathy, lumbar region: Secondary | ICD-10-CM

## 2017-06-15 ENCOUNTER — Ambulatory Visit
Admission: RE | Admit: 2017-06-15 | Discharge: 2017-06-15 | Disposition: A | Discharge status: Home / Self Care | Payer: Medicare Other | Source: Ambulatory Visit | Attending: Neurosurgery | Admitting: Neurosurgery

## 2017-06-15 DIAGNOSIS — M5126 Other intervertebral disc displacement, lumbar region: Secondary | ICD-10-CM | POA: Diagnosis not present

## 2017-06-15 DIAGNOSIS — M47816 Spondylosis without myelopathy or radiculopathy, lumbar region: Secondary | ICD-10-CM

## 2017-06-15 IMAGING — XA Imaging study
2 series · 2 of 2 positions shown · non-contrast
Comparison: none

CLINICAL DATA: Adjacent level disease with displacement of the L1-2
lumbar disc. Upper lumbar spine pain.

[Series 1: ortho standard · 1 of 1 slices shown (1 of 2)]
[im 1/1]
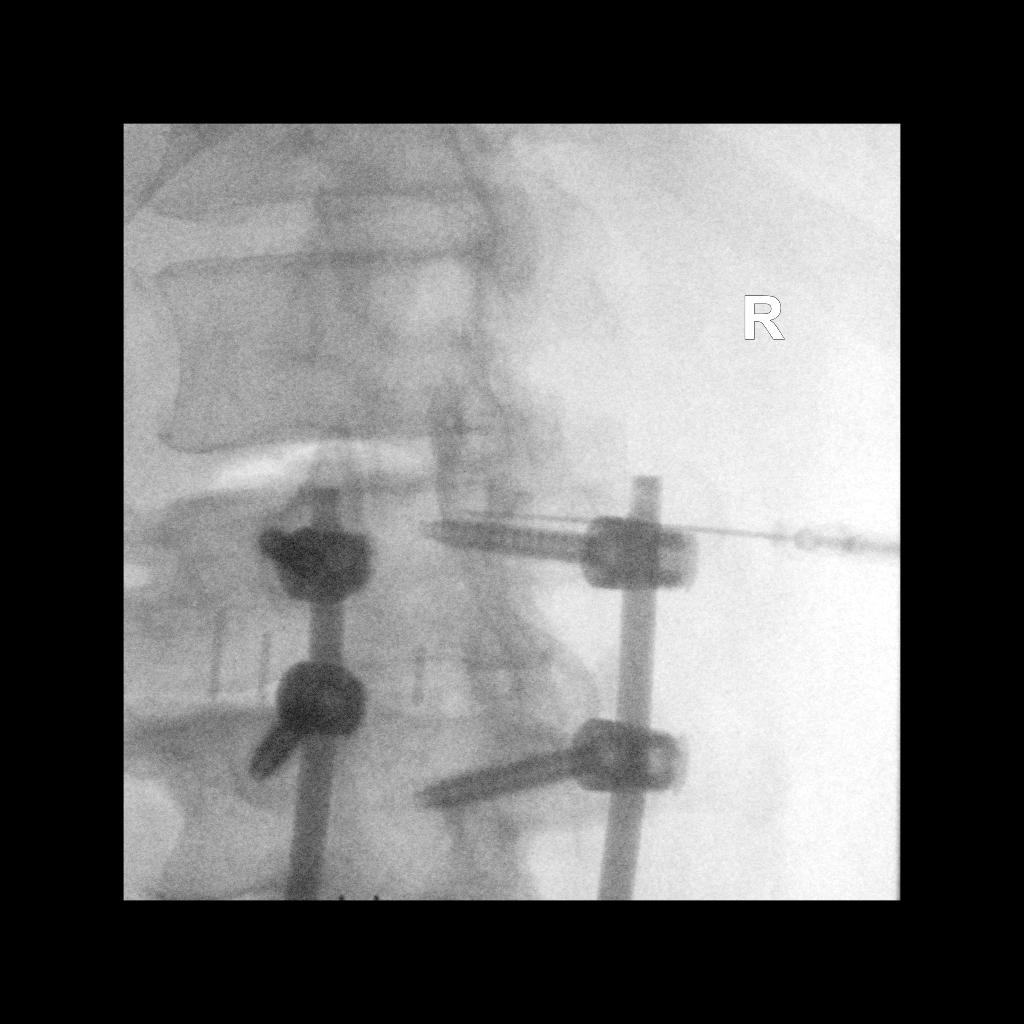

[Series 2: ortho standard · 1 of 1 slices shown (2 of 2)]
[im 1/1]
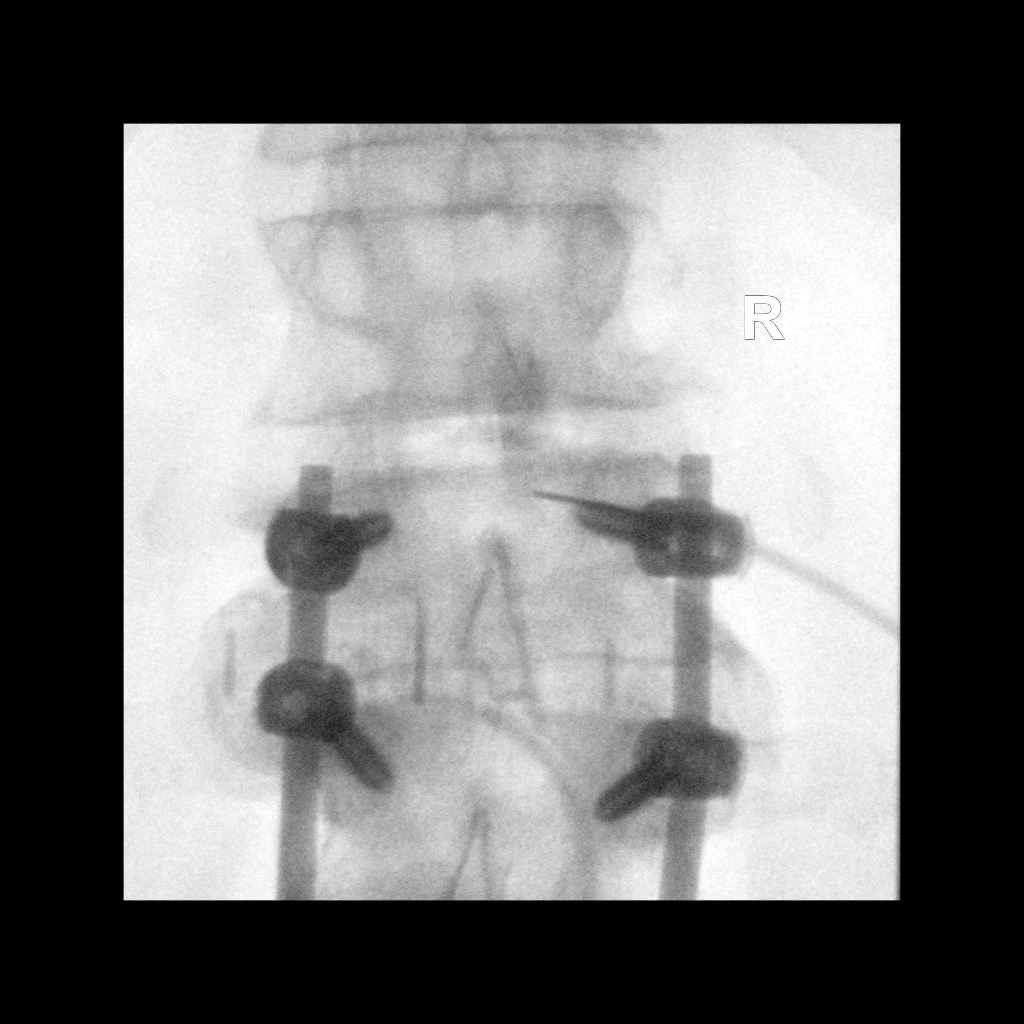

[2 of 2 positions shown; findings below may reference images not displayed]

FLUOROSCOPY TIME:  Radiation Exposure Index (as provided by the
fluoroscopic device): 59.71 uGy*m2

Fluoroscopy Time:  30 seconds

Number of Acquired Images:  0

PROCEDURE:
The procedure, risks, benefits, and alternatives were explained to
the patient. Questions regarding the procedure were encouraged and
answered. The patient understands and consents to the procedure.

LUMBAR EPIDURAL INJECTION:

An interlaminar approach was performed on right at L1-2. The
overlying skin was cleansed and anesthetized. A 20 gauge epidural
needle was advanced using loss-of-resistance technique.

DIAGNOSTIC EPIDURAL INJECTION:

Injection of Isovue-M 200 shows a good epidural pattern with spread
above and below the level of needle placement, primarily on the
right no vascular opacification is seen.

THERAPEUTIC EPIDURAL INJECTION:

120 Mg of Depo-Medrol mixed with 3 mL 1% lidocaine were instilled.
The procedure was well-tolerated, and the patient was discharged
thirty minutes following the injection in good condition.

COMPLICATIONS:
None
IMPRESSION: Technically successful epidural injection on the right at L1-2 # 1

## 2017-06-15 MED ORDER — METHYLPREDNISOLONE ACETATE 40 MG/ML INJ SUSP (RADIOLOG
120.0000 mg | Freq: Once | INTRAMUSCULAR | Status: AC
  Administered 2017-06-15: 120 mg via EPIDURAL

## 2017-06-15 MED ORDER — IOPAMIDOL (ISOVUE-M 200) INJECTION 41%
1.0000 mL | Freq: Once | INTRAMUSCULAR | Status: AC
  Administered 2017-06-15: 1 mL via EPIDURAL

## 2017-06-15 NOTE — Discharge Instructions (Signed)

## 2017-07-11 DIAGNOSIS — R29818 Other symptoms and signs involving the nervous system: Secondary | ICD-10-CM | POA: Diagnosis not present

## 2017-07-11 DIAGNOSIS — I10 Essential (primary) hypertension: Secondary | ICD-10-CM | POA: Diagnosis not present

## 2017-07-11 DIAGNOSIS — R202 Paresthesia of skin: Secondary | ICD-10-CM | POA: Diagnosis not present

## 2017-07-11 DIAGNOSIS — R200 Anesthesia of skin: Secondary | ICD-10-CM | POA: Diagnosis not present

## 2017-07-13 DIAGNOSIS — M4716 Other spondylosis with myelopathy, lumbar region: Secondary | ICD-10-CM | POA: Diagnosis not present

## 2017-08-23 DIAGNOSIS — Z6836 Body mass index (BMI) 36.0-36.9, adult: Secondary | ICD-10-CM | POA: Diagnosis not present

## 2017-08-23 DIAGNOSIS — R05 Cough: Secondary | ICD-10-CM | POA: Diagnosis not present

## 2017-08-23 DIAGNOSIS — J069 Acute upper respiratory infection, unspecified: Secondary | ICD-10-CM | POA: Diagnosis not present

## 2017-09-07 DIAGNOSIS — R7301 Impaired fasting glucose: Secondary | ICD-10-CM | POA: Diagnosis not present

## 2017-09-07 DIAGNOSIS — N400 Enlarged prostate without lower urinary tract symptoms: Secondary | ICD-10-CM | POA: Diagnosis not present

## 2017-09-07 DIAGNOSIS — E7800 Pure hypercholesterolemia, unspecified: Secondary | ICD-10-CM | POA: Diagnosis not present

## 2017-09-07 DIAGNOSIS — K7581 Nonalcoholic steatohepatitis (NASH): Secondary | ICD-10-CM | POA: Diagnosis not present

## 2017-09-07 DIAGNOSIS — N183 Chronic kidney disease, stage 3 (moderate): Secondary | ICD-10-CM | POA: Diagnosis not present

## 2017-09-09 DIAGNOSIS — K7581 Nonalcoholic steatohepatitis (NASH): Secondary | ICD-10-CM | POA: Diagnosis not present

## 2017-09-09 DIAGNOSIS — N183 Chronic kidney disease, stage 3 (moderate): Secondary | ICD-10-CM | POA: Diagnosis not present

## 2017-09-09 DIAGNOSIS — Z0000 Encounter for general adult medical examination without abnormal findings: Secondary | ICD-10-CM | POA: Diagnosis not present

## 2017-09-09 DIAGNOSIS — R7301 Impaired fasting glucose: Secondary | ICD-10-CM | POA: Diagnosis not present

## 2017-09-09 DIAGNOSIS — Z23 Encounter for immunization: Secondary | ICD-10-CM | POA: Diagnosis not present

## 2017-09-09 DIAGNOSIS — E7800 Pure hypercholesterolemia, unspecified: Secondary | ICD-10-CM | POA: Diagnosis not present

## 2017-09-09 DIAGNOSIS — N644 Mastodynia: Secondary | ICD-10-CM | POA: Diagnosis not present

## 2017-09-09 DIAGNOSIS — M545 Low back pain: Secondary | ICD-10-CM | POA: Diagnosis not present

## 2017-09-09 DIAGNOSIS — N401 Enlarged prostate with lower urinary tract symptoms: Secondary | ICD-10-CM | POA: Diagnosis not present

## 2017-09-09 DIAGNOSIS — E6609 Other obesity due to excess calories: Secondary | ICD-10-CM | POA: Diagnosis not present

## 2017-09-09 DIAGNOSIS — M170 Bilateral primary osteoarthritis of knee: Secondary | ICD-10-CM | POA: Diagnosis not present

## 2017-09-09 DIAGNOSIS — Z6835 Body mass index (BMI) 35.0-35.9, adult: Secondary | ICD-10-CM | POA: Diagnosis not present

## 2017-11-24 DIAGNOSIS — R1031 Right lower quadrant pain: Secondary | ICD-10-CM | POA: Diagnosis not present

## 2017-11-24 DIAGNOSIS — Z6835 Body mass index (BMI) 35.0-35.9, adult: Secondary | ICD-10-CM | POA: Diagnosis not present

## 2017-11-24 DIAGNOSIS — B360 Pityriasis versicolor: Secondary | ICD-10-CM | POA: Diagnosis not present

## 2017-12-03 DIAGNOSIS — K838 Other specified diseases of biliary tract: Secondary | ICD-10-CM | POA: Diagnosis not present

## 2017-12-03 DIAGNOSIS — N281 Cyst of kidney, acquired: Secondary | ICD-10-CM | POA: Diagnosis not present

## 2017-12-03 DIAGNOSIS — R1031 Right lower quadrant pain: Secondary | ICD-10-CM | POA: Diagnosis not present

## 2017-12-03 DIAGNOSIS — R932 Abnormal findings on diagnostic imaging of liver and biliary tract: Secondary | ICD-10-CM | POA: Diagnosis not present

## 2017-12-03 DIAGNOSIS — N289 Disorder of kidney and ureter, unspecified: Secondary | ICD-10-CM | POA: Diagnosis not present

## 2017-12-14 DIAGNOSIS — R109 Unspecified abdominal pain: Secondary | ICD-10-CM | POA: Diagnosis not present

## 2017-12-14 DIAGNOSIS — R1031 Right lower quadrant pain: Secondary | ICD-10-CM | POA: Diagnosis not present

## 2017-12-21 ENCOUNTER — Encounter (INDEPENDENT_AMBULATORY_CARE_PROVIDER_SITE_OTHER): Payer: Self-pay | Admitting: Internal Medicine

## 2017-12-21 ENCOUNTER — Ambulatory Visit (INDEPENDENT_AMBULATORY_CARE_PROVIDER_SITE_OTHER): Payer: Medicare Other | Admitting: Internal Medicine

## 2017-12-21 ENCOUNTER — Encounter (INDEPENDENT_AMBULATORY_CARE_PROVIDER_SITE_OTHER): Payer: Self-pay | Admitting: *Deleted

## 2017-12-21 DIAGNOSIS — R1031 Right lower quadrant pain: Secondary | ICD-10-CM

## 2017-12-21 LAB — CBC WITH DIFFERENTIAL/PLATELET
BASOS PCT: 0.7 %
Basophils Absolute: 43 cells/uL (ref 0–200)
EOS ABS: 98 {cells}/uL (ref 15–500)
Eosinophils Relative: 1.6 %
HCT: 40.4 % (ref 38.5–50.0)
HEMOGLOBIN: 13.8 g/dL (ref 13.2–17.1)
Lymphs Abs: 1897 cells/uL (ref 850–3900)
MCH: 30.7 pg (ref 27.0–33.0)
MCHC: 34.2 g/dL (ref 32.0–36.0)
MCV: 90 fL (ref 80.0–100.0)
MPV: 11.5 fL (ref 7.5–12.5)
Monocytes Relative: 7 %
Neutro Abs: 3636 cells/uL (ref 1500–7800)
Neutrophils Relative %: 59.6 %
PLATELETS: 230 10*3/uL (ref 140–400)
RBC: 4.49 10*6/uL (ref 4.20–5.80)
RDW: 13.1 % (ref 11.0–15.0)
TOTAL LYMPHOCYTE: 31.1 %
WBC: 6.1 10*3/uL (ref 3.8–10.8)
WBCMIX: 427 {cells}/uL (ref 200–950)

## 2017-12-21 LAB — COMPREHENSIVE METABOLIC PANEL
AG RATIO: 2.6 (calc) — AB (ref 1.0–2.5)
ALT: 35 U/L (ref 9–46)
AST: 25 U/L (ref 10–35)
Albumin: 4.2 g/dL (ref 3.6–5.1)
Alkaline phosphatase (APISO): 55 U/L (ref 40–115)
BILIRUBIN TOTAL: 0.4 mg/dL (ref 0.2–1.2)
BUN/Creatinine Ratio: 14 (calc) (ref 6–22)
BUN: 17 mg/dL (ref 7–25)
CALCIUM: 9.3 mg/dL (ref 8.6–10.3)
CHLORIDE: 105 mmol/L (ref 98–110)
CO2: 31 mmol/L (ref 20–32)
Creat: 1.2 mg/dL — ABNORMAL HIGH (ref 0.70–1.18)
GLOBULIN: 1.6 g/dL — AB (ref 1.9–3.7)
Glucose, Bld: 109 mg/dL (ref 65–139)
POTASSIUM: 4.9 mmol/L (ref 3.5–5.3)
Sodium: 140 mmol/L (ref 135–146)
Total Protein: 5.8 g/dL — ABNORMAL LOW (ref 6.1–8.1)

## 2017-12-21 NOTE — Progress Notes (Addendum)
Subjective:    Patient ID: Bruce Stewart, male    DOB: 13-Nov-1945, 72 y.o.   MRN: [id number]  HPI Referred by Dr Quintin Alto for RLQ pain.  12/14/2017   abdominal pain x 2 months:  HIDA scan GB EF 40. Normal GB ejection fraction.  He says when he lays on his rt side, it feels like he is lying on a golf ball.  He points to his rt mid abdomen (almost at flank). Pain x 2 months. He has chronic back pain.  He says his stools never formed.  Stools are soft. Usually has 4 BMs a day. Has a BM after eating. . No weight loss. Has a lot of gas. Appetite is good.  No family hx of Crohn's Disease.   [id number] US abdomen complete: RT lower abdominal pain: Coarse echogenic liver with poor sonic penetration compatible with diffuse hepatic steatosis.  Small hypodense exophytic lesion of rt mid kidney is probably a cyst although the small size limits assessment of the internal structure. Mild CBD dilatation at 26mm. No visualized cholelithiasis. Overlying bowel gas obscures the pancreas.     09/12/2015 Colonoscopy Family hx of colon cancer at age 54 and diet at age 24. Average risk: Prep satisfactory. Single small diverticulum at ascending colon. Moderate number of diverticula at sigmoid colon. Small polyp was cold snared for sigmoid colon. Smaller piece was ablated via cold biopsy. Larger piece was lost. Normal rectal mucosa. Prominent hemorrhoids above the dentate line.   Retired from [employer]. [employer]   Review of Systems Past Medical History:  Diagnosis Date  . Anal fissure   . Diverticulitis   . Family hx of colon cancer   . Kidney stone     Past Surgical History:  Procedure Laterality Date  . back surgery x 2    . CIRCUMCISION    . COLONOSCOPY N/A 09/12/2015   Procedure: COLONOSCOPY;  Surgeon: Rogene Houston, MD;  Location: [facility];  Service: Endoscopy;  Laterality: N/A;  2:25-moved up to 58 Ann notified pt  . repair of anal fissure     spincterotomy    Allergies    Allergen Reactions  . Codeine Itching  . Orange Fruit [Citrus] Itching  . Bee Venom Rash    Current Outpatient Medications on File Prior to Visit  Medication Sig Dispense Refill  . cholecalciferol (VITAMIN D) 400 units TABS tablet Take by mouth daily.    . diphenhydramine-acetaminophen (TYLENOL PM) 25-500 MG TABS tablet Take 1 tablet by mouth at bedtime as needed.    . Fish Oil-Cholecalciferol (FISH OIL + D3 PO) Take by mouth daily.     Marland Kitchen ibuprofen (ADVIL,MOTRIN) 200 MG tablet Take 600 mg by mouth daily as needed for moderate pain.     . Multiple Vitamin (MULTIVITAMIN WITH MINERALS) TABS tablet Take 1 tablet by mouth daily.    . tamsulosin (FLOMAX) 0.4 MG CAPS capsule Take 0.4 mg by mouth as needed.      No current facility-administered medications on file prior to visit.         Objective:   Physical Exam Blood pressure (!) 178/80, pulse 64, temperature 98.3 F (36.8 C), height 6\' 4"  (1.93 m), weight 297 lb 6.4 oz (134.9 kg). Alert and oriented. Skin warm and dry. Oral mucosa is moist.   . Sclera anicteric, conjunctivae is pink. Thyroid not enlarged. No cervical lymphadenopathy. Lungs clear. Heart regular rate and rhythm.  Abdomen is soft. Bowel sounds are positive. No hepatomegaly. No  abdominal masses felt. Slight  Tenderness rt mid abdomen.  No edema to lower extremities.           Assessment & Plan:  RLQ pain. Really no pain today. Pain worse when lying on rt side. I did not feel any masses. Am going to get a CT abdomen/pelvis with CM. CBC and CMET. Further recommendations to follow. Colonoscopy is up to date.

## 2017-12-21 NOTE — Patient Instructions (Signed)
CT abdomen. Labs today.

## 2017-12-24 DIAGNOSIS — M4716 Other spondylosis with myelopathy, lumbar region: Secondary | ICD-10-CM | POA: Diagnosis not present

## 2017-12-24 DIAGNOSIS — Z981 Arthrodesis status: Secondary | ICD-10-CM | POA: Diagnosis not present

## 2017-12-30 DIAGNOSIS — M47816 Spondylosis without myelopathy or radiculopathy, lumbar region: Secondary | ICD-10-CM | POA: Diagnosis not present

## 2017-12-30 DIAGNOSIS — M4716 Other spondylosis with myelopathy, lumbar region: Secondary | ICD-10-CM | POA: Diagnosis not present

## 2017-12-31 ENCOUNTER — Ambulatory Visit (HOSPITAL_COMMUNITY)
Admission: RE | Admit: 2017-12-31 | Discharge: 2017-12-31 | Disposition: A | Discharge status: Home / Self Care | Payer: Medicare Other | Source: Ambulatory Visit | Attending: Internal Medicine | Admitting: Internal Medicine

## 2017-12-31 ENCOUNTER — Encounter (HOSPITAL_COMMUNITY): Payer: Self-pay

## 2017-12-31 DIAGNOSIS — M4606 Spinal enthesopathy, lumbar region: Secondary | ICD-10-CM | POA: Diagnosis not present

## 2017-12-31 DIAGNOSIS — N200 Calculus of kidney: Secondary | ICD-10-CM | POA: Diagnosis not present

## 2017-12-31 DIAGNOSIS — R933 Abnormal findings on diagnostic imaging of other parts of digestive tract: Secondary | ICD-10-CM | POA: Insufficient documentation

## 2017-12-31 DIAGNOSIS — I700 Atherosclerosis of aorta: Secondary | ICD-10-CM | POA: Insufficient documentation

## 2017-12-31 DIAGNOSIS — K5730 Diverticulosis of large intestine without perforation or abscess without bleeding: Secondary | ICD-10-CM | POA: Insufficient documentation

## 2017-12-31 DIAGNOSIS — M899 Disorder of bone, unspecified: Secondary | ICD-10-CM | POA: Insufficient documentation

## 2017-12-31 DIAGNOSIS — R1031 Right lower quadrant pain: Secondary | ICD-10-CM | POA: Insufficient documentation

## 2017-12-31 IMAGING — CT CT ABD-PELV W/ CM
2 of 5 series · 15 of 46 positions shown, 17 images · IV contrast (Isovue)
Comparison: Abdominal ultrasound [DATE].  CT scan [DATE].

CLINICAL DATA: Right lower quadrant abdominal pain and back pain
for several months. History of renal calculi.

EXAM:
CT ABDOMEN AND PELVIS WITH CONTRAST
TECHNIQUE: Multidetector CT imaging of the abdomen and pelvis was performed
using the standard protocol following bolus administration of
intravenous contrast.
CONTRAST:  100 cc Isovue 300

[Series 2: axial (person_name) (person_name) · axial · 0.96mm/px · z∈[-455,-5]mm · 12 of 101 slices shown, 14 images]
[im 6/101  soft-tissue]
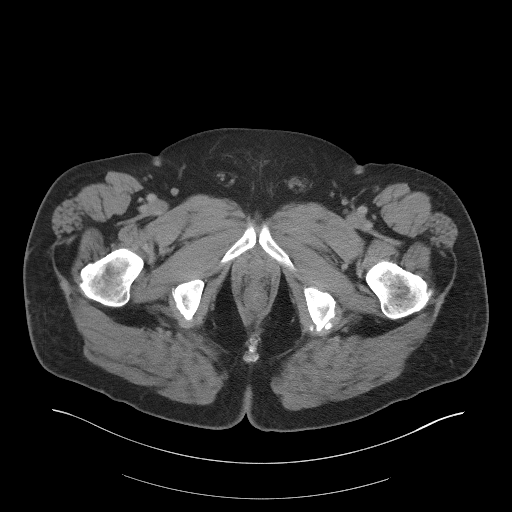
[im 6/101  bone]
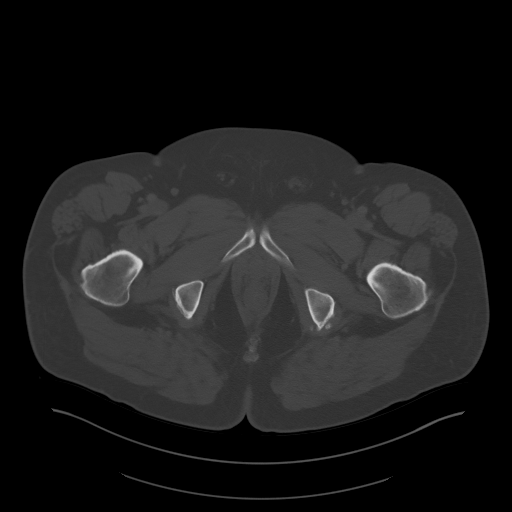
[im 16/101  soft-tissue]
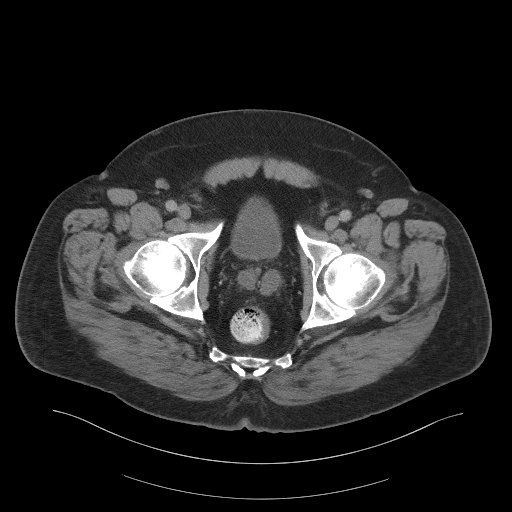
[im 21/101  soft-tissue]
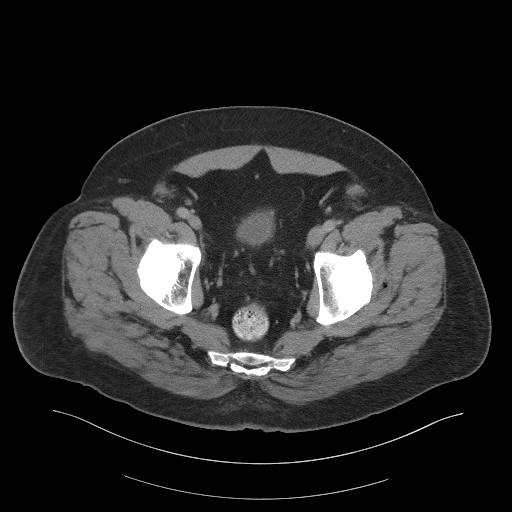
[im 31/101  soft-tissue]
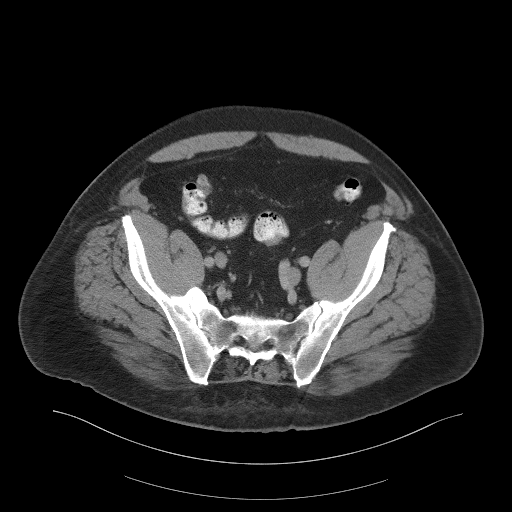
[im 41/101  soft-tissue]
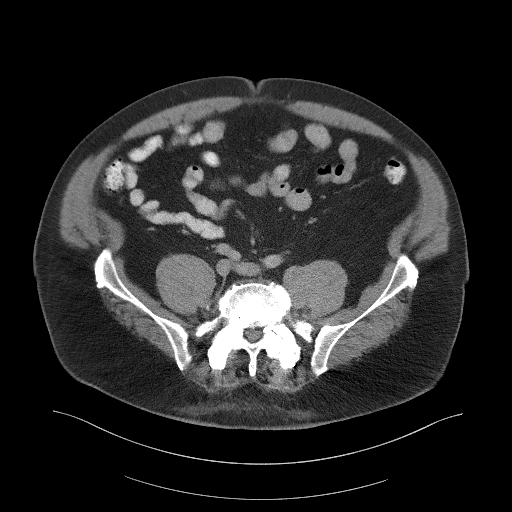
[im 46/101  soft-tissue]
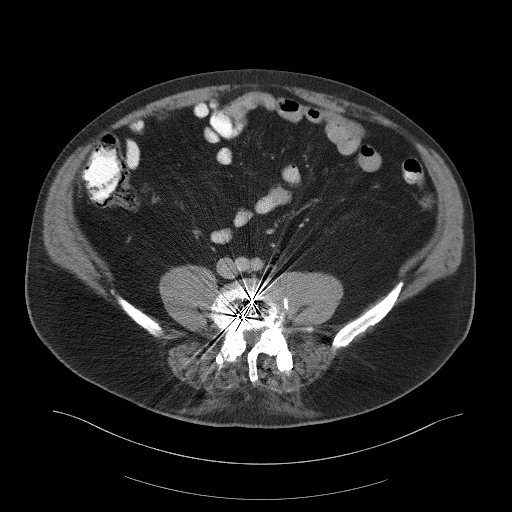
[im 56/101  soft-tissue]
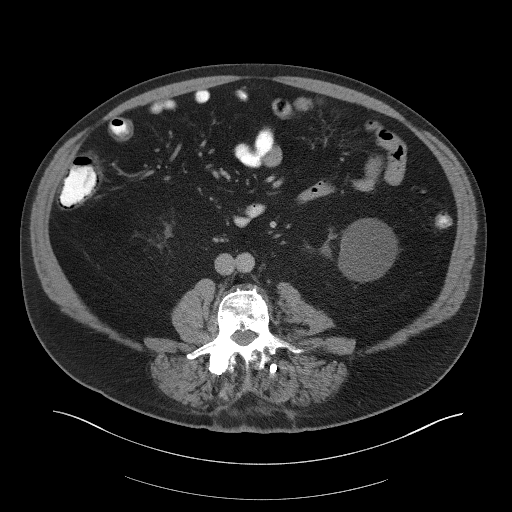
[im 61/101  soft-tissue]
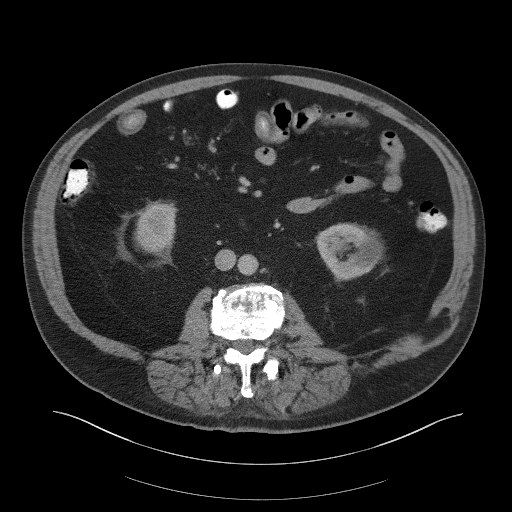
[im 71/101  soft-tissue]
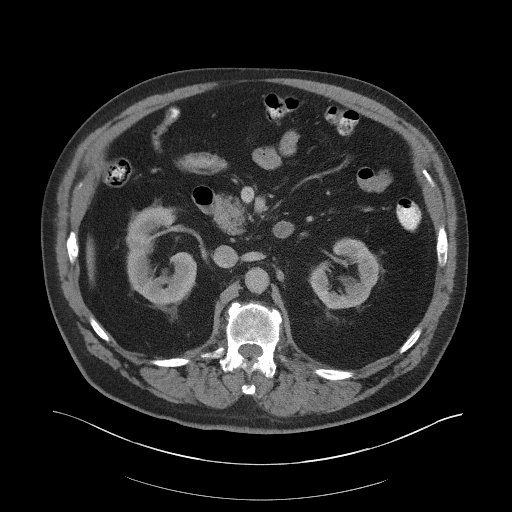
[im 71/101  bone]
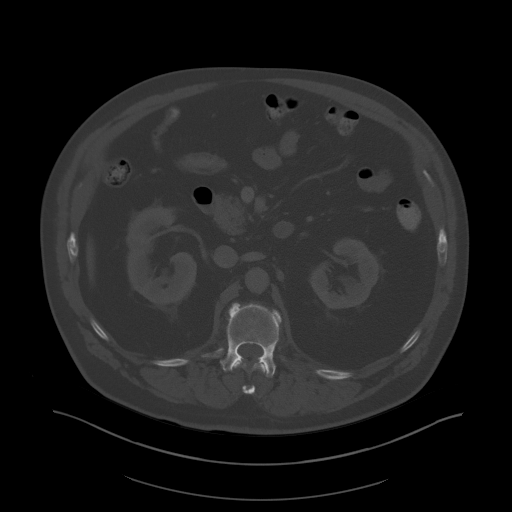
[im 81/101  soft-tissue]
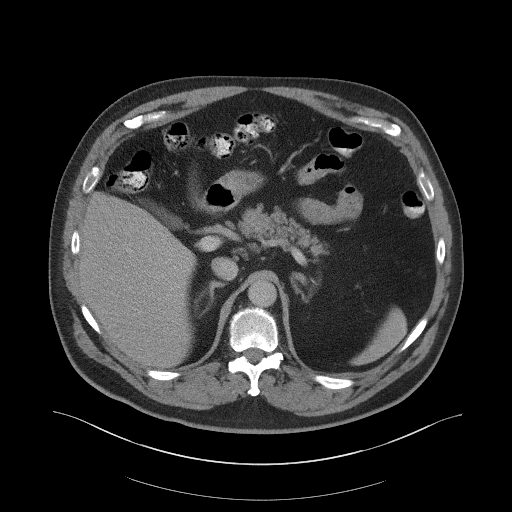
[im 86/101  soft-tissue]
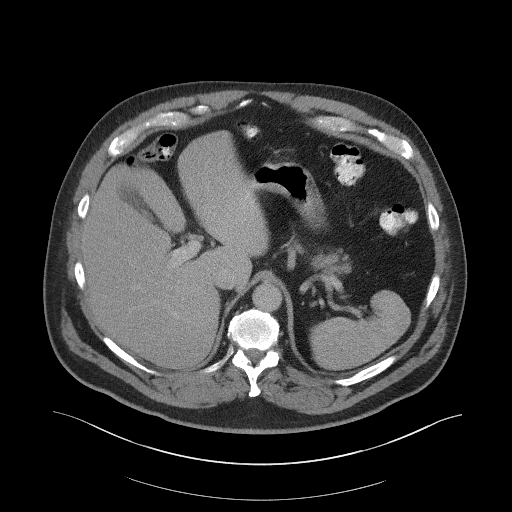
[im 96/101  soft-tissue]
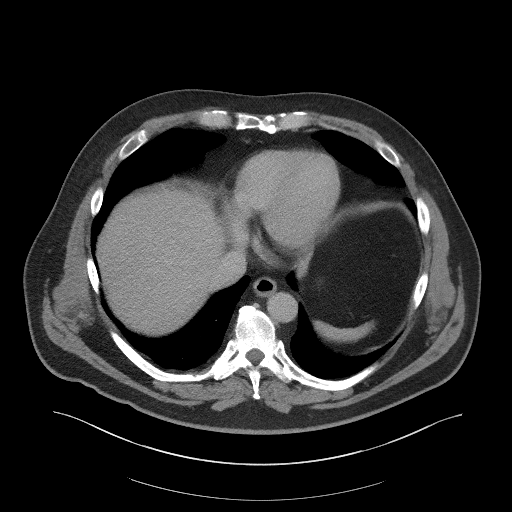

[Series 5: coronal st · coronal · 0.87mm/px · 3 of 128 slices shown]
[im 43/128  soft-tissue]
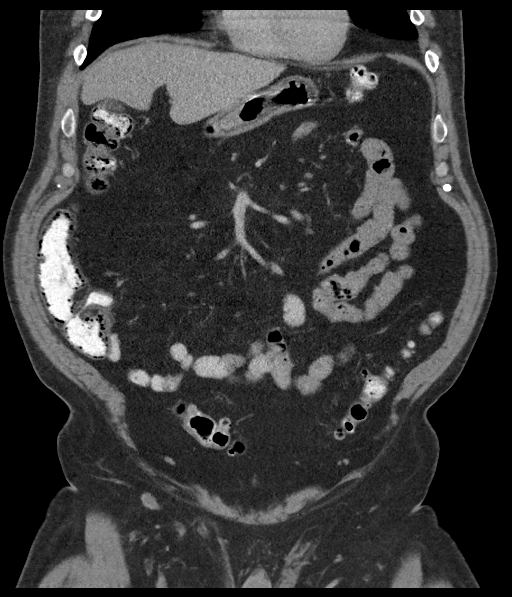
[im 57/128  soft-tissue]
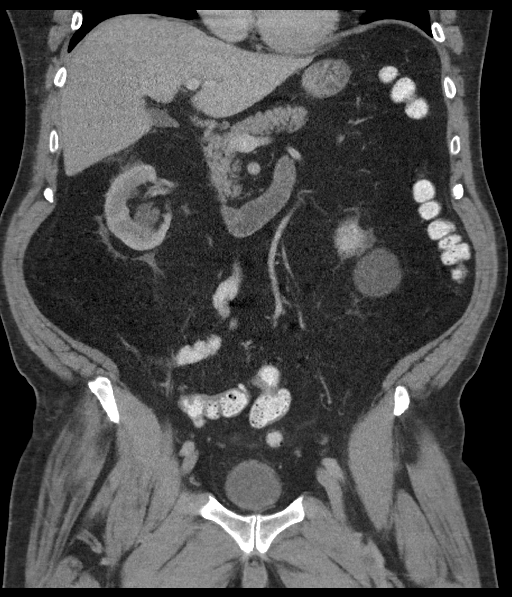
[im 71/128  soft-tissue]
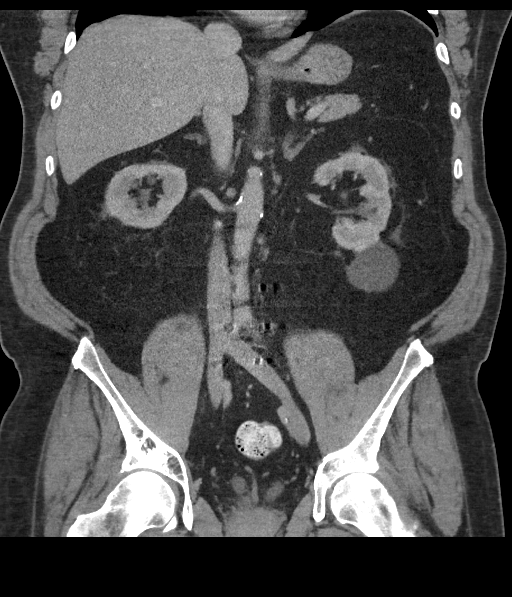

[15 of 46 positions shown; findings below may reference images not displayed]

FINDINGS: Lower chest: Unremarkable

Hepatobiliary: Unremarkable

Pancreas: Unremarkable

Spleen: Unremarkable

Adrenals/Urinary Tract: Adrenal glands appear normal. 9 mm in long
axis nonobstructive right renal calculus, image 74/5.

Exophytic 5.8 by 5.4 by 6.2 cm homogeneous fluid density lesion of
the left kidney lower pole compatible with cyst. Small bilateral
peripelvic cysts.

Stomach/Bowel: Sigmoid colon diverticulosis without active
diverticulitis. The appendix appears normal. In the distal ileum,
there are 3 separate segments of focal segmental wall thickening,
for example on images 33, 31, and 42 of series 2. For the most part
these areas are symmetric, and mild. On image 37/2 there may be a
slight polypoid component.

Vascular/Lymphatic: Aortoiliac atherosclerotic vascular disease. No
pathologic adenopathy.

Reproductive: Unremarkable

Other: No supplemental non-categorized findings.

Musculoskeletal: Small left lumbar hernia containing adipose tissue
in the eleventh intercostal space, as shown on image 39/2. Bridging
spurring of the right sacroiliac joint.

Posterolateral rod and pedicle screw fixation at L2-L3-L4-L5-S1 with
some mild lucency around the pedicle screws at S1. Interbody fusion.
Spurring causes mild foraminal impingement bilaterally at L5-S1.

At the T12-L1 level, there is a 1.7 by 0.9 by 0.8 cm dense lesion
immediately medial to the left facet joint (image 78/6); based on
its location this is most likely a proteinaceous/complex synovial
cyst, with a small mass being a less likely differential diagnostic
consideration.
IMPRESSION: 1. Three separate but nearby regions of mild symmetric wall
thickening in the distal ileum. These could represent skip lesions
in the setting of Crohn's disease, or less likely a neoplastic
process such as lymphoma. Infectious terminal ileitis is possible
although the multifocal element would be unusual. Similarly,
ischemia can appear multifocal but the degree of wall thickening is
less than typically encountered in ischemia.
2. 9 mm in long axis nonobstructive right renal calculus.
3. Sigmoid colon diverticulosis.
4.  Aortic Atherosclerosis ([J2]-[J2]).
5. Left lumbar hernia containing adipose tissue.
6. Postoperative findings in the lumbar spine. Spurring causes mild
bilateral foraminal impingement at L5-S1.
7. New somewhat hyperdense lesion immediately medial to the left
T12-L1 facet joint. I favor this being extradural in location, and
based on location and appearance this is thought to be a
proteinaceous synovial cyst. If clinically warranted this could be
further characterized with lumbar MRI.

## 2017-12-31 MED ORDER — IOPAMIDOL (ISOVUE-300) INJECTION 61%: 100.0000 mL | Freq: Once | INTRAVENOUS | Status: DC | PRN

## 2018-01-03 ENCOUNTER — Other Ambulatory Visit (INDEPENDENT_AMBULATORY_CARE_PROVIDER_SITE_OTHER): Payer: Self-pay | Admitting: Internal Medicine

## 2018-01-03 ENCOUNTER — Telehealth (INDEPENDENT_AMBULATORY_CARE_PROVIDER_SITE_OTHER): Payer: Self-pay | Admitting: Internal Medicine

## 2018-01-03 DIAGNOSIS — R933 Abnormal findings on diagnostic imaging of other parts of digestive tract: Secondary | ICD-10-CM

## 2018-01-03 NOTE — Telephone Encounter (Signed)
Ann, Colonoscopy. Having back surgery 01/12/2018

## 2018-01-03 NOTE — Telephone Encounter (Signed)
I will discuss with Dr. Rehman. 

## 2018-01-03 NOTE — Telephone Encounter (Signed)
Forwarded back to Bruce Stewart -- with patient having back surgery he may need to have OV after he is released

## 2018-01-03 NOTE — Telephone Encounter (Signed)
err

## 2018-01-04 ENCOUNTER — Telehealth (INDEPENDENT_AMBULATORY_CARE_PROVIDER_SITE_OTHER): Payer: Self-pay | Admitting: Internal Medicine

## 2018-01-04 ENCOUNTER — Other Ambulatory Visit (INDEPENDENT_AMBULATORY_CARE_PROVIDER_SITE_OTHER): Payer: Self-pay | Admitting: Internal Medicine

## 2018-01-04 DIAGNOSIS — R1031 Right lower quadrant pain: Secondary | ICD-10-CM

## 2018-01-04 DIAGNOSIS — R1030 Lower abdominal pain, unspecified: Secondary | ICD-10-CM

## 2018-01-04 DIAGNOSIS — R935 Abnormal findings on diagnostic imaging of other abdominal regions, including retroperitoneum: Secondary | ICD-10-CM

## 2018-01-04 NOTE — Telephone Encounter (Signed)
Bruce Stewart, MR enterography abdomen

## 2018-01-04 NOTE — Telephone Encounter (Signed)
Will put colonoscopy on hold for now and will get an MR enterography abdomen. Patient is having back surgery in March.

## 2018-01-04 NOTE — Telephone Encounter (Signed)
MRI sch'd 01/07/18 1200 (1030), npo 4 hrs before, patient aware

## 2018-01-04 NOTE — Progress Notes (Signed)
M

## 2018-01-05 NOTE — Telephone Encounter (Signed)
I spoke with Bruce Stewart and the patient needed to have her MR done prior to 3/15.  He is now scheduled to have it done 01/11/18 at 1:00 pm.

## 2018-01-05 NOTE — Telephone Encounter (Signed)
I received a call from [facility] from centralized scheduling stating because of the patient's weight he will need to have his MR enterography done at [facility] radiology department.  He has been rescheduled to March 15th at 1:00 pm to arrive at 12:45pm.  The radiology department will contact the patient and make him aware of this change.

## 2018-01-07 ENCOUNTER — Ambulatory Visit (HOSPITAL_COMMUNITY): Payer: Medicare Other

## 2018-01-10 ENCOUNTER — Encounter (INDEPENDENT_AMBULATORY_CARE_PROVIDER_SITE_OTHER): Payer: Self-pay | Admitting: Internal Medicine

## 2018-01-10 DIAGNOSIS — Z01818 Encounter for other preprocedural examination: Secondary | ICD-10-CM | POA: Diagnosis not present

## 2018-01-10 DIAGNOSIS — Z538 Procedure and treatment not carried out for other reasons: Secondary | ICD-10-CM | POA: Diagnosis not present

## 2018-01-10 DIAGNOSIS — M47816 Spondylosis without myelopathy or radiculopathy, lumbar region: Secondary | ICD-10-CM | POA: Diagnosis not present

## 2018-01-11 ENCOUNTER — Encounter (INDEPENDENT_AMBULATORY_CARE_PROVIDER_SITE_OTHER): Payer: Self-pay | Admitting: *Deleted

## 2018-01-11 ENCOUNTER — Ambulatory Visit (HOSPITAL_COMMUNITY): Payer: Medicare Other

## 2018-01-11 ENCOUNTER — Telehealth (INDEPENDENT_AMBULATORY_CARE_PROVIDER_SITE_OTHER): Payer: Self-pay | Admitting: *Deleted

## 2018-01-11 ENCOUNTER — Other Ambulatory Visit (INDEPENDENT_AMBULATORY_CARE_PROVIDER_SITE_OTHER): Payer: Self-pay | Admitting: Internal Medicine

## 2018-01-11 ENCOUNTER — Telehealth (INDEPENDENT_AMBULATORY_CARE_PROVIDER_SITE_OTHER): Payer: Self-pay | Admitting: Internal Medicine

## 2018-01-11 DIAGNOSIS — R935 Abnormal findings on diagnostic imaging of other abdominal regions, including retroperitoneum: Secondary | ICD-10-CM

## 2018-01-11 NOTE — Progress Notes (Signed)
Colonoscopy ordered.

## 2018-01-11 NOTE — Telephone Encounter (Signed)
err

## 2018-01-11 NOTE — Telephone Encounter (Signed)
Patient called left message stating Dr Carloyn Manner told him that his surgery Wednesday will need to be postponed, patient is scheduled for a MRI  01/11/18. Please advise patient [phone].

## 2018-01-11 NOTE — Telephone Encounter (Signed)
I Have spoken with patient

## 2018-01-11 NOTE — Telephone Encounter (Signed)
I have ordered colonoscopy

## 2018-01-11 NOTE — Progress Notes (Signed)
err

## 2018-01-12 ENCOUNTER — Telehealth (INDEPENDENT_AMBULATORY_CARE_PROVIDER_SITE_OTHER): Payer: Self-pay | Admitting: *Deleted

## 2018-01-12 DIAGNOSIS — R935 Abnormal findings on diagnostic imaging of other abdominal regions, including retroperitoneum: Secondary | ICD-10-CM | POA: Insufficient documentation

## 2018-01-12 NOTE — Telephone Encounter (Signed)
Called patient to schedule his TCS ib 3/25.  He can't do this on that date and is seeing [place] on 3/25 and has waited for this procedure for 4 mos.    He is having a MRI tomorrow for Dr. Carloyn Manner.  He will call back once all this is settled to schedule his TCS

## 2018-01-12 NOTE — Telephone Encounter (Signed)
Noted  

## 2018-01-13 DIAGNOSIS — M47816 Spondylosis without myelopathy or radiculopathy, lumbar region: Secondary | ICD-10-CM | POA: Diagnosis not present

## 2018-01-13 DIAGNOSIS — M6748 Ganglion, other site: Secondary | ICD-10-CM | POA: Diagnosis not present

## 2018-01-13 DIAGNOSIS — M5135 Other intervertebral disc degeneration, thoracolumbar region: Secondary | ICD-10-CM | POA: Diagnosis not present

## 2018-01-13 DIAGNOSIS — M4805 Spinal stenosis, thoracolumbar region: Secondary | ICD-10-CM | POA: Diagnosis not present

## 2018-01-14 DIAGNOSIS — M47812 Spondylosis without myelopathy or radiculopathy, cervical region: Secondary | ICD-10-CM | POA: Diagnosis not present

## 2018-01-18 ENCOUNTER — Other Ambulatory Visit (INDEPENDENT_AMBULATORY_CARE_PROVIDER_SITE_OTHER): Payer: Self-pay | Admitting: Internal Medicine

## 2018-01-18 ENCOUNTER — Telehealth (INDEPENDENT_AMBULATORY_CARE_PROVIDER_SITE_OTHER): Payer: Self-pay | Admitting: *Deleted

## 2018-01-18 ENCOUNTER — Encounter (INDEPENDENT_AMBULATORY_CARE_PROVIDER_SITE_OTHER): Payer: Self-pay | Admitting: *Deleted

## 2018-01-18 DIAGNOSIS — R935 Abnormal findings on diagnostic imaging of other abdominal regions, including retroperitoneum: Secondary | ICD-10-CM

## 2018-01-18 MED ORDER — PEG 3350-KCL-NA BICARB-NACL 420 G PO SOLR: 4000.0000 mL | Freq: Once | ORAL | 0 refills | Status: AC

## 2018-01-18 NOTE — Telephone Encounter (Signed)
Patient needs trilyte 

## 2018-01-20 ENCOUNTER — Telehealth (INDEPENDENT_AMBULATORY_CARE_PROVIDER_SITE_OTHER): Payer: Self-pay | Admitting: *Deleted

## 2018-01-20 MED ORDER — PEG 3350-KCL-NA BICARB-NACL 420 G PO SOLR: 4000.0000 mL | Freq: Once | ORAL | 0 refills | Status: AC

## 2018-01-20 NOTE — Telephone Encounter (Signed)
Patient needs trilyte 

## 2018-01-24 ENCOUNTER — Ambulatory Visit (HOSPITAL_COMMUNITY): Admit: 2018-01-24 | Payer: Medicare Other | Admitting: Internal Medicine

## 2018-01-24 ENCOUNTER — Encounter (HOSPITAL_COMMUNITY): Payer: Self-pay

## 2018-01-24 SURGERY — COLONOSCOPY
Anesthesia: Moderate Sedation

## 2018-02-11 ENCOUNTER — Encounter (HOSPITAL_COMMUNITY): Payer: Self-pay | Admitting: *Deleted

## 2018-02-11 ENCOUNTER — Other Ambulatory Visit: Payer: Self-pay

## 2018-02-11 ENCOUNTER — Ambulatory Visit (HOSPITAL_COMMUNITY)
Admission: RE | Admit: 2018-02-11 | Discharge: 2018-02-11 | Disposition: A | Discharge status: Home / Self Care | Payer: Medicare Other | Source: Ambulatory Visit | Attending: Internal Medicine | Admitting: Internal Medicine

## 2018-02-11 ENCOUNTER — Encounter (HOSPITAL_COMMUNITY)
Admission: RE | Disposition: A | Discharge status: Home / Self Care | Payer: Self-pay | Source: Ambulatory Visit | Attending: Internal Medicine

## 2018-02-11 DIAGNOSIS — K5730 Diverticulosis of large intestine without perforation or abscess without bleeding: Secondary | ICD-10-CM | POA: Insufficient documentation

## 2018-02-11 DIAGNOSIS — Z79899 Other long term (current) drug therapy: Secondary | ICD-10-CM | POA: Diagnosis not present

## 2018-02-11 DIAGNOSIS — R935 Abnormal findings on diagnostic imaging of other abdominal regions, including retroperitoneum: Secondary | ICD-10-CM | POA: Insufficient documentation

## 2018-02-11 DIAGNOSIS — R197 Diarrhea, unspecified: Secondary | ICD-10-CM | POA: Insufficient documentation

## 2018-02-11 DIAGNOSIS — R1031 Right lower quadrant pain: Secondary | ICD-10-CM | POA: Diagnosis not present

## 2018-02-11 DIAGNOSIS — R933 Abnormal findings on diagnostic imaging of other parts of digestive tract: Secondary | ICD-10-CM | POA: Diagnosis not present

## 2018-02-11 DIAGNOSIS — Z800 Family history of malignant neoplasm of digestive organs: Secondary | ICD-10-CM | POA: Diagnosis not present

## 2018-02-11 DIAGNOSIS — K633 Ulcer of intestine: Secondary | ICD-10-CM | POA: Insufficient documentation

## 2018-02-11 DIAGNOSIS — K6389 Other specified diseases of intestine: Secondary | ICD-10-CM | POA: Diagnosis not present

## 2018-02-11 DIAGNOSIS — K529 Noninfective gastroenteritis and colitis, unspecified: Secondary | ICD-10-CM

## 2018-02-11 HISTORY — PX: COLONOSCOPY: SHX5424

## 2018-02-11 HISTORY — PX: BIOPSY: SHX5522

## 2018-02-11 LAB — C-REACTIVE PROTEIN: CRP: <0.8 mg/dL (ref <1.0)

## 2018-02-11 SURGERY — COLONOSCOPY
Anesthesia: Moderate Sedation

## 2018-02-11 MED ORDER — MEPERIDINE HCL 50 MG/ML IJ SOLN
INTRAMUSCULAR | Status: DC | PRN
  Administered 2018-02-11 (×2): 25 mg via INTRAVENOUS

## 2018-02-11 MED ORDER — SODIUM CHLORIDE 0.9 % IV SOLN
INTRAVENOUS | Status: DC
  Administered 2018-02-11: 09:00:00 via INTRAVENOUS

## 2018-02-11 MED ORDER — STERILE WATER FOR IRRIGATION IR SOLN
Status: DC | PRN
  Administered 2018-02-11: 2.5 mL

## 2018-02-11 MED ORDER — MIDAZOLAM HCL 5 MG/5ML IJ SOLN
INTRAMUSCULAR | Status: AC
  Filled 2018-02-11: qty 10

## 2018-02-11 MED ORDER — MIDAZOLAM HCL 5 MG/5ML IJ SOLN
INTRAMUSCULAR | Status: DC | PRN
  Administered 2018-02-11 (×4): 2 mg via INTRAVENOUS
  Administered 2018-02-11 (×2): 1 mg via INTRAVENOUS

## 2018-02-11 MED ORDER — MEPERIDINE HCL 50 MG/ML IJ SOLN
INTRAMUSCULAR | Status: AC
  Filled 2018-02-11: qty 1

## 2018-02-11 NOTE — H&P (Signed)
Bruce Stewart is an 72 y.o. male.   Chief Complaint: Patient is here for colonoscopy. HPI: Patient is 72 year old Caucasian male who presents with 30-month history of right lower quadrant abdominal pain which generally occurs for his bowel movements and last for 10-15 minutes and he also has noted diarrhea with as many as 5 stools daily.  He has not experience rectal bleeding melena or weight loss.  He underwent abdominopelvic CT which revealed thickening to terminal ileum.  He is therefore undergoing diagnostic colonoscopy.  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  Last colonoscopy was in October 2016 because of family history of CRC and he had one non-adenomatous polyp. Family history significant for CRC and father who was 70 at the time of diagnosis and died 2 years later.  Past Medical History:  Diagnosis Date  . Anal fissure   . Diverticulitis   . Family hx of colon cancer   . Kidney stone     Past Surgical History:  Procedure Laterality Date  . back surgery x 2    . CIRCUMCISION    . COLONOSCOPY N/A 09/12/2015   Procedure: COLONOSCOPY;  Surgeon: Rogene Houston, MD;  Location: [facility];  Service: Endoscopy;  Laterality: N/A;  2:25-moved up to 70 Ann notified pt  . repair of anal fissure     spincterotomy    Family History  Problem Relation Age of Onset  . Diabetes Mother   . Colon cancer Father    Social History:  reports that he has never smoked. He has never used smokeless tobacco. He reports that he does not drink alcohol or use drugs.  Allergies:  Allergies  Allergen Reactions  . Codeine Itching  . Orange Fruit [Citrus] Itching  . Bee Venom Rash    Medications Prior to Admission  Medication Sig Dispense Refill  . acetaminophen (TYLENOL) 500 MG tablet Take 1,000 mg by mouth every 8 (eight) hours as needed for mild pain or moderate pain.    Marland Kitchen Apple Cider Vinegar 600 MG CAPS Take 1 capsule by mouth  daily.    . Calcium-Magnesium-Zinc (CAL-MAG-ZINC PO) Take 1 tablet by mouth at bedtime.    . cetaphil (CETAPHIL) cream Apply 1 application topically daily. After shower    . Cholecalciferol 5000 units TABS Take 5,000 Units by mouth daily.     . diphenhydramine-acetaminophen (TYLENOL PM) 25-500 MG TABS tablet Take 1 tablet by mouth at bedtime.     Marland Kitchen Fish Oil-Cholecalciferol (FISH OIL + D3 PO) Take 1,400 mg by mouth daily.     Marland Kitchen gabapentin (NEURONTIN) 300 MG capsule Take 600 mg by mouth every 4 (four) hours.    . Multiple Vitamin (MULTIVITAMIN WITH MINERALS) TABS tablet Take 1 tablet by mouth daily.    . tamsulosin (FLOMAX) 0.4 MG CAPS capsule Take 0.4 mg by mouth daily.     Sallye Lat (VISINE ADVANCED RELIEF) 0.05-0.1-1-1 % SOLN Place 1 drop into both eyes 3 (three) times daily as needed (dry eyes).    Marland Kitchen ibuprofen (ADVIL,MOTRIN) 200 MG tablet Take 600 mg by mouth daily as needed for moderate pain.       No results found for this or any previous visit (from the past 48 hour(s)). No results found.  ROS  Blood pressure (!) 159/88, pulse 84, temperature 99 F (37.2 C), temperature source Oral, resp. rate 13, height 6\' 4"  (1.93 m), weight 295 lb (133.8 kg),  SpO2 96 %. Physical Exam  Constitutional: He appears well-developed and well-nourished.  HENT:  Mouth/Throat: Oropharynx is clear and moist.  Eyes: Conjunctivae are normal. No scleral icterus.  Neck: No thyromegaly present.  Cardiovascular: Normal rate, regular rhythm and normal heart sounds.  No murmur heard. Respiratory: Effort normal and breath sounds normal.  GI:  Abdomen is full.  It is soft with mild tenderness at LLQ.  No organomegaly or masses.  Musculoskeletal: He exhibits no edema.  Lymphadenopathy:    He has no cervical adenopathy.  Neurological: He is alert.  Skin: Skin is warm and dry.    Abdominopelvic CT films from 12/31/2017 reviewed.  Reveals thickening to terminal  ileum.   Assessment/Plan Diarrhea and right lower quadrant abdominal pain. Thickened terminal ileum on abdominopelvic CT. Diagnostic colonoscopy.  Hildred Laser, MD 02/11/2018, 10:25 AM

## 2018-02-11 NOTE — Discharge Instructions (Signed)
No aspirin or NSAIDs for 3 days. Resume usual medications and diet as before. No driving for 24 hours. Patient will call with results of blood test biopsy and stool test.     Colonoscopy, Adult, Care After This sheet gives you information about how to care for yourself after your procedure. Your doctor may also give you more specific instructions. If you have problems or questions, call your doctor. Follow these instructions at home: General instructions   For the first 24 hours after the procedure: ? Do not drive or use machinery. ? Do not sign important documents. ? Do not drink alcohol. ? Do your daily activities more slowly than normal. ? Eat foods that are soft and easy to digest. ? Rest often.  Take over-the-counter or prescription medicines only as told by your doctor.  It is up to you to get the results of your procedure. Ask your doctor, or the department performing the procedure, when your results will be ready. To help cramping and bloating:  Try walking around.  Put heat on your belly (abdomen) as told by your doctor. Use a heat source that your doctor recommends, such as a moist heat pack or a heating pad. ? Put a towel between your skin and the heat source. ? Leave the heat on for 20-30 minutes. ? Remove the heat if your skin turns bright red. This is especially important if you cannot feel pain, heat, or cold. You can get burned. Eating and drinking  Drink enough fluid to keep your pee (urine) clear or pale yellow.  Return to your normal diet as told by your doctor. Avoid heavy or fried foods that are hard to digest.  Avoid drinking alcohol for as long as told by your doctor. Contact a doctor if:  You have blood in your poop (stool) 2-3 days after the procedure. Get help right away if:  You have more than a small amount of blood in your poop.  You see large clumps of tissue (blood clots) in your poop.  Your belly is swollen.  You feel sick to your  stomach (nauseous).  You throw up (vomit).  You have a fever.  You have belly pain that gets worse, and medicine does not help your pain. This information is not intended to replace advice given to you by your health care provider. Make sure you discuss any questions you have with your health care provider. Document Released: 11/21/2010 Document Revised: 07/13/2016 Document Reviewed: 07/13/2016 Elsevier Interactive Patient Education  2017 [employer].

## 2018-02-11 NOTE — Op Note (Signed)
[facility] Patient Name: Bruce Stewart Procedure Date: 02/11/2018 10:14 AM MRN: [id number] Date of Birth: 25-Jul-1946 Attending MD: Hildred Laser , MD CSN: [id number] Age: 72 Admit Type: Outpatient Procedure:                Colonoscopy Indications:              Abdominal pain in the right lower quadrant,                            Diarrhea, Abnormal CT of the GI tract Providers:                Hildred Laser, MD, Lurline Del, RN, Randa Spike,                            Technician Referring MD:             Manon Hilding, MD Medicines:                Meperidine 50 mg IV, Midazolam 10 mg IV Complications:            No immediate complications. Estimated Blood Loss:     Estimated blood loss was minimal. Procedure:                Pre-Anesthesia Assessment:                           - Prior to the procedure, a History and Physical                            was performed, and patient medications and                            allergies were reviewed. The patient's tolerance of                            previous anesthesia was also reviewed. The risks                            and benefits of the procedure and the sedation                            options and risks were discussed with the patient.                            All questions were answered, and informed consent                            was obtained. Prior Anticoagulants: The patient                            last took ibuprofen 3 days prior to the procedure.                            ASA Grade Assessment: II - A patient with mild  systemic disease. After reviewing the risks and                            benefits, the patient was deemed in satisfactory                            condition to undergo the procedure.                           After obtaining informed consent, the colonoscope                            was passed under direct vision. Throughout the     procedure, the patient's blood pressure, pulse, and                            oxygen saturations were monitored continuously. The                            EC-[id number] ([id number]) was introduced through the                            anus and advanced to the the terminal ileum, with                            identification of the appendiceal orifice and IC                            valve. The colonoscopy was technically difficult                            and complex. The patient tolerated the procedure                            well. The quality of the bowel preparation was                            good. The terminal ileum, ileocecal valve,                            appendiceal orifice, and rectum were photographed. Scope In: 10:39:23 AM Scope Out: 11:20:41 AM Scope Withdrawal Time: 0 hours 30 minutes 34 seconds  Total Procedure Duration: 0 hours 41 minutes 18 seconds  Findings:      The perianal and digital rectal examinations were normal.      The terminal ileum contained two small ulcers. No bleeding was present.       Biopsies were taken with a cold forceps for histology. The pathology       specimen was placed into Bottle Number 1.      A few petechiae were found in the proximal ascending colon.      Multiple small and large-mouthed diverticula were found in the sigmoid       colon.      stool specimen obtained for GI Pathogen panel.      The retroflexed view of  the distal rectum and anal verge was normal and       showed no anal or rectal abnormalities. Impression:               - Two small ulcers in the terminal ileum. Biopsied.                            Biopsy taken from surrounding mucosa.                           - Petechia(e) in the proximal ascending colon.                            Mucosa normal.                           - Diverticulosis in the sigmoid colon.                           - The distal rectum and anal verge are normal on                             retroflexion view. Moderate Sedation:      Moderate (conscious) sedation was administered by the endoscopy nurse       and supervised by the endoscopist. The following parameters were       monitored: oxygen saturation, heart rate, blood pressure, CO2       capnography and response to care. Total physician intraservice time was       50 minutes. Recommendation:           - Patient has a contact number available for                            emergencies. The signs and symptoms of potential                            delayed complications were discussed with the                            patient. Return to normal activities tomorrow.                            Written discharge instructions were provided to the                            patient.                           - Resume previous diet today.                           - Continue present medications.                           - No aspirin, ibuprofen, naproxen, or other  non-steroidal anti-inflammatory drugs for 3 days.                           - Stool samople taken for GI path. panel.                           - CRP.                           - Await pathology results. Procedure Code(s):        --- Professional ---                           [id number], Colonoscopy, flexible; with biopsy, single                            or multiple                           G0500, Moderate sedation services provided by the                            same physician or other qualified health care                            professional performing a gastrointestinal                            endoscopic service that sedation supports,                            requiring the presence of an independent trained                            observer to assist in the monitoring of the                            patient's level of consciousness and physiological                            status; initial 15 minutes of intra-service  time;                            patient age 5 years or older (additional time may                            be reported with [phone], as appropriate)                           [phone], Moderate sedation services provided by the                            same physician or other qualified health care  professional performing the diagnostic or                            therapeutic service that the sedation supports,                            requiring the presence of an independent trained                            observer to assist in the monitoring of the                            patient's level of consciousness and physiological                            status; each additional 15 minutes intraservice                            time (List separately in addition to code for                            primary service)                           [phone], Moderate sedation services provided by the                            same physician or other qualified health care                            professional performing the diagnostic or                            therapeutic service that the sedation supports,                            requiring the presence of an independent trained                            observer to assist in the monitoring of the                            patient's level of consciousness and physiological                            status; each additional 15 minutes intraservice                            time (List separately in addition to code for                            primary service) Diagnosis Code(s):        --- Professional ---  K63.3, Ulcer of intestine                           K63.89, Other specified diseases of intestine                           R10.31, Right lower quadrant pain                           R19.7, Diarrhea, unspecified                           K57.30, Diverticulosis of large  intestine without                            perforation or abscess without bleeding                           R93.3, Abnormal findings on diagnostic imaging of                            other parts of digestive tract CPT copyright 2017 [employer]. All rights reserved. The codes documented in this report are preliminary and upon coder review may  be revised to meet current compliance requirements. Hildred Laser, MD Hildred Laser, MD 02/11/2018 11:37:41 AM This report has been signed electronically. Number of Addenda: 0

## 2018-02-12 LAB — GASTROINTESTINAL PANEL BY PCR, STOOL (REPLACES STOOL CULTURE)
ASTROVIRUS: NOT DETECTED
Adenovirus F40/41: NOT DETECTED
CAMPYLOBACTER SPECIES: NOT DETECTED
CRYPTOSPORIDIUM: NOT DETECTED
CYCLOSPORA CAYETANENSIS: NOT DETECTED
ENTEROTOXIGENIC E COLI (ETEC): NOT DETECTED
Entamoeba histolytica: NOT DETECTED
Enteroaggregative E coli (EAEC): NOT DETECTED
Enteropathogenic E coli (EPEC): NOT DETECTED
Giardia lamblia: NOT DETECTED
Norovirus GI/GII: NOT DETECTED
PLESIMONAS SHIGELLOIDES: NOT DETECTED
Rotavirus A: NOT DETECTED
SALMONELLA SPECIES: NOT DETECTED
SAPOVIRUS (I, II, IV, AND V): NOT DETECTED
SHIGA LIKE TOXIN PRODUCING E COLI (STEC): NOT DETECTED
SHIGELLA/ENTEROINVASIVE E COLI (EIEC): NOT DETECTED
VIBRIO SPECIES: NOT DETECTED
Vibrio cholerae: NOT DETECTED
Yersinia enterocolitica: NOT DETECTED

## 2018-02-14 ENCOUNTER — Telehealth (INDEPENDENT_AMBULATORY_CARE_PROVIDER_SITE_OTHER): Payer: Self-pay | Admitting: Internal Medicine

## 2018-02-14 NOTE — Telephone Encounter (Signed)
Patient called stating he got on Mychart last night doesn't really understand the meaning of everything - please call back at [phone]

## 2018-02-15 NOTE — Telephone Encounter (Signed)
Dr. Rehman has talked with the patient. 

## 2018-02-17 ENCOUNTER — Encounter (INDEPENDENT_AMBULATORY_CARE_PROVIDER_SITE_OTHER): Payer: Self-pay | Admitting: *Deleted

## 2018-02-22 ENCOUNTER — Ambulatory Visit (INDEPENDENT_AMBULATORY_CARE_PROVIDER_SITE_OTHER): Payer: Medicare Other | Admitting: Internal Medicine

## 2018-02-28 ENCOUNTER — Encounter (HOSPITAL_COMMUNITY): Payer: Self-pay | Admitting: Internal Medicine

## 2018-03-04 DIAGNOSIS — M47816 Spondylosis without myelopathy or radiculopathy, lumbar region: Secondary | ICD-10-CM | POA: Diagnosis not present

## 2018-03-04 DIAGNOSIS — M7138 Other bursal cyst, other site: Secondary | ICD-10-CM | POA: Diagnosis not present

## 2018-03-07 DIAGNOSIS — M170 Bilateral primary osteoarthritis of knee: Secondary | ICD-10-CM | POA: Diagnosis not present

## 2018-03-07 DIAGNOSIS — M25462 Effusion, left knee: Secondary | ICD-10-CM | POA: Diagnosis not present

## 2018-03-07 DIAGNOSIS — E7800 Pure hypercholesterolemia, unspecified: Secondary | ICD-10-CM | POA: Diagnosis not present

## 2018-03-07 DIAGNOSIS — M25461 Effusion, right knee: Secondary | ICD-10-CM | POA: Diagnosis not present

## 2018-03-07 DIAGNOSIS — M25562 Pain in left knee: Secondary | ICD-10-CM | POA: Diagnosis not present

## 2018-03-07 DIAGNOSIS — M25561 Pain in right knee: Secondary | ICD-10-CM | POA: Diagnosis not present

## 2018-03-07 DIAGNOSIS — N183 Chronic kidney disease, stage 3 (moderate): Secondary | ICD-10-CM | POA: Diagnosis not present

## 2018-03-09 DIAGNOSIS — N183 Chronic kidney disease, stage 3 (moderate): Secondary | ICD-10-CM | POA: Diagnosis not present

## 2018-03-09 DIAGNOSIS — R7301 Impaired fasting glucose: Secondary | ICD-10-CM | POA: Diagnosis not present

## 2018-03-09 DIAGNOSIS — E7800 Pure hypercholesterolemia, unspecified: Secondary | ICD-10-CM | POA: Diagnosis not present

## 2018-03-09 DIAGNOSIS — N644 Mastodynia: Secondary | ICD-10-CM | POA: Diagnosis not present

## 2018-03-09 DIAGNOSIS — M545 Low back pain: Secondary | ICD-10-CM | POA: Diagnosis not present

## 2018-03-09 DIAGNOSIS — K7581 Nonalcoholic steatohepatitis (NASH): Secondary | ICD-10-CM | POA: Diagnosis not present

## 2018-03-09 DIAGNOSIS — Z6836 Body mass index (BMI) 36.0-36.9, adult: Secondary | ICD-10-CM | POA: Diagnosis not present

## 2018-03-09 DIAGNOSIS — N401 Enlarged prostate with lower urinary tract symptoms: Secondary | ICD-10-CM | POA: Diagnosis not present

## 2018-03-14 DIAGNOSIS — M25561 Pain in right knee: Secondary | ICD-10-CM | POA: Diagnosis not present

## 2018-03-14 DIAGNOSIS — M25562 Pain in left knee: Secondary | ICD-10-CM | POA: Diagnosis not present

## 2018-03-14 DIAGNOSIS — M170 Bilateral primary osteoarthritis of knee: Secondary | ICD-10-CM | POA: Diagnosis not present

## 2018-03-16 ENCOUNTER — Ambulatory Visit (INDEPENDENT_AMBULATORY_CARE_PROVIDER_SITE_OTHER): Payer: Medicare Other | Admitting: Internal Medicine

## 2018-03-16 ENCOUNTER — Encounter (INDEPENDENT_AMBULATORY_CARE_PROVIDER_SITE_OTHER): Payer: Self-pay | Admitting: Internal Medicine

## 2018-03-16 VITALS — BP 144/78 | HR 60 | Temp 98.0°F | Ht 74.0 in | Wt 301.2 lb

## 2018-03-16 DIAGNOSIS — K633 Ulcer of intestine: Secondary | ICD-10-CM | POA: Diagnosis not present

## 2018-03-16 NOTE — Patient Instructions (Signed)
Labs today. OV in 6 months.  

## 2018-03-16 NOTE — Progress Notes (Signed)
Subjective:    Patient ID: Bruce Stewart, male    DOB: [birth year], 72 y.o.   MRN: [id number]  HPI Here today for f/u. Underwent a colonoscopy which revealed two small ulcers in the terminal ileum. Petechia in proximal ascending colon. Diverticulosis in sigmoid colon Biopsy: Benign appearing ileal ulcer without changes of Crohn's disease.  02/11/2018 GI pathogen normal.  02/11/2018 CRP less than 0.8.  He brings his stool diary in and his stool are better. Not as many loose stools.' He has stopped the Motrin. His appetite is good. He has gained 3 pounds since his last visit.  Usually has about 2 small BMs  Review of Systems Past Medical History:  Diagnosis Date  . Anal fissure   . Diverticulitis   . Family hx of colon cancer   . Kidney stone     Past Surgical History:  Procedure Laterality Date  . back surgery x 2    . BIOPSY  02/11/2018   Procedure: BIOPSY;  Surgeon: Rogene Houston, MD;  [facility];  Service: Endoscopy;;  ileal  . CIRCUMCISION    . COLONOSCOPY N/A 09/12/2015   Procedure: COLONOSCOPY;  Surgeon: Rogene Houston, MD;  [facility]: [facility];  Service: Endoscopy;  Laterality: N/A;  2:25-moved up to [place] notified pt  . COLONOSCOPY N/A 02/11/2018   Procedure: COLONOSCOPY;  Surgeon: Rogene Houston, MD;  [facility];  Service: Endoscopy;  Laterality: N/A;  9:15-moved to [address] to notify pt  . repair of anal fissure     spincterotomy    Allergies  Allergen Reactions  . Codeine Itching  . Orange Fruit [Citrus] Itching  . Bee Venom Rash    Current Outpatient Medications on File Prior to Visit  Medication Sig Dispense Refill  . acetaminophen (TYLENOL) 500 MG tablet Take 1,000 mg by mouth every 8 (eight) hours as needed for mild pain or moderate pain.    Marland Kitchen Apple Cider Vinegar 600 MG CAPS Take 1 capsule by mouth daily.    . Calcium-Magnesium-Zinc (CAL-MAG-ZINC PO) Take 1 tablet by mouth at bedtime.    . cetaphil (CETAPHIL) cream  Apply 1 application topically daily. After shower    . Cholecalciferol 5000 units TABS Take 5,000 Units by mouth daily.     . diphenhydramine-acetaminophen (TYLENOL PM) 25-500 MG TABS tablet Take 1 tablet by mouth at bedtime.     Marland Kitchen Fish Oil-Cholecalciferol (FISH OIL + D3 PO) Take 1,400 mg by mouth daily.     Marland Kitchen gabapentin (NEURONTIN) 300 MG capsule Take 600 mg by mouth every 4 (four) hours.    . Multiple Vitamin (MULTIVITAMIN WITH MINERALS) TABS tablet Take 1 tablet by mouth daily.    . tamsulosin (FLOMAX) 0.4 MG CAPS capsule Take 0.4 mg by mouth daily.     Sallye Lat (VISINE ADVANCED RELIEF) 0.05-0.1-1-1 % SOLN Place 1 drop into both eyes 3 (three) times daily as needed (dry eyes).     No current facility-administered medications on file prior to visit.         Objective:   Physical Exam Blood pressure (!) 144/78, pulse 60, temperature 98 F (36.7 C), height 6\' 2"  (1.88 m), weight (!) 301 lb 3.2 oz (136.6 kg). Alert and oriented. Skin warm and dry. Oral mucosa is moist.   . Sclera anicteric, conjunctivae is pink. Thyroid not enlarged. No cervical lymphadenopathy. Lungs clear. Heart regular rate and rhythm.  Abdomen is soft. Bowel sounds are positive. No hepatomegaly. No  abdominal masses felt. No tenderness.  1+ edema to lower extremities.          Assessment & Plan:  Colonic ulcer. He is doing better. Having 2 stools a day. Some are loose.  CBC and CRP OV in 6 months.

## 2018-03-17 LAB — CBC WITH DIFFERENTIAL/PLATELET
BASOS PCT: 0.5 %
Basophils Absolute: 32 cells/uL (ref 0–200)
EOS ABS: 113 {cells}/uL (ref 15–500)
Eosinophils Relative: 1.8 %
HCT: 37.5 % — ABNORMAL LOW (ref 38.5–50.0)
Hemoglobin: 13.3 g/dL (ref 13.2–17.1)
Lymphs Abs: 2148 cells/uL (ref 850–3900)
MCH: 32.2 pg (ref 27.0–33.0)
MCHC: 35.5 g/dL (ref 32.0–36.0)
MCV: 90.8 fL (ref 80.0–100.0)
MONOS PCT: 9.3 %
MPV: 11.4 fL (ref 7.5–12.5)
NEUTROS PCT: 54.3 %
Neutro Abs: 3421 cells/uL (ref 1500–7800)
PLATELETS: 236 10*3/uL (ref 140–400)
RBC: 4.13 10*6/uL — ABNORMAL LOW (ref 4.20–5.80)
RDW: 13 % (ref 11.0–15.0)
TOTAL LYMPHOCYTE: 34.1 %
WBC: 6.3 10*3/uL (ref 3.8–10.8)
WBCMIX: 586 {cells}/uL (ref 200–950)

## 2018-03-17 LAB — C-REACTIVE PROTEIN: CRP: 4.6 mg/L (ref <8.0)

## 2018-03-21 DIAGNOSIS — M25562 Pain in left knee: Secondary | ICD-10-CM | POA: Diagnosis not present

## 2018-03-21 DIAGNOSIS — M170 Bilateral primary osteoarthritis of knee: Secondary | ICD-10-CM | POA: Diagnosis not present

## 2018-03-21 DIAGNOSIS — M25561 Pain in right knee: Secondary | ICD-10-CM | POA: Diagnosis not present

## 2018-03-31 DIAGNOSIS — N401 Enlarged prostate with lower urinary tract symptoms: Secondary | ICD-10-CM | POA: Diagnosis not present

## 2018-03-31 DIAGNOSIS — K219 Gastro-esophageal reflux disease without esophagitis: Secondary | ICD-10-CM | POA: Diagnosis not present

## 2018-03-31 DIAGNOSIS — M7138 Other bursal cyst, other site: Secondary | ICD-10-CM | POA: Diagnosis not present

## 2018-03-31 DIAGNOSIS — Z981 Arthrodesis status: Secondary | ICD-10-CM | POA: Diagnosis not present

## 2018-03-31 DIAGNOSIS — Z91018 Allergy to other foods: Secondary | ICD-10-CM | POA: Diagnosis not present

## 2018-03-31 DIAGNOSIS — R112 Nausea with vomiting, unspecified: Secondary | ICD-10-CM | POA: Diagnosis not present

## 2018-03-31 DIAGNOSIS — R109 Unspecified abdominal pain: Secondary | ICD-10-CM | POA: Diagnosis not present

## 2018-03-31 DIAGNOSIS — Z885 Allergy status to narcotic agent status: Secondary | ICD-10-CM | POA: Diagnosis not present

## 2018-03-31 DIAGNOSIS — R0902 Hypoxemia: Secondary | ICD-10-CM | POA: Diagnosis not present

## 2018-03-31 DIAGNOSIS — G629 Polyneuropathy, unspecified: Secondary | ICD-10-CM | POA: Diagnosis not present

## 2018-03-31 DIAGNOSIS — N400 Enlarged prostate without lower urinary tract symptoms: Secondary | ICD-10-CM | POA: Diagnosis not present

## 2018-03-31 DIAGNOSIS — I10 Essential (primary) hypertension: Secondary | ICD-10-CM | POA: Diagnosis not present

## 2018-03-31 DIAGNOSIS — Z91030 Bee allergy status: Secondary | ICD-10-CM | POA: Diagnosis not present

## 2018-03-31 DIAGNOSIS — M4306 Spondylolysis, lumbar region: Secondary | ICD-10-CM | POA: Diagnosis not present

## 2018-03-31 DIAGNOSIS — M47817 Spondylosis without myelopathy or radiculopathy, lumbosacral region: Secondary | ICD-10-CM | POA: Diagnosis not present

## 2018-03-31 DIAGNOSIS — Z79899 Other long term (current) drug therapy: Secondary | ICD-10-CM | POA: Diagnosis not present

## 2018-03-31 DIAGNOSIS — Z87442 Personal history of urinary calculi: Secondary | ICD-10-CM | POA: Diagnosis not present

## 2018-03-31 DIAGNOSIS — M4325 Fusion of spine, thoracolumbar region: Secondary | ICD-10-CM | POA: Diagnosis not present

## 2018-03-31 DIAGNOSIS — M4625 Osteomyelitis of vertebra, thoracolumbar region: Secondary | ICD-10-CM | POA: Diagnosis not present

## 2018-03-31 DIAGNOSIS — M47816 Spondylosis without myelopathy or radiculopathy, lumbar region: Secondary | ICD-10-CM | POA: Diagnosis not present

## 2018-04-08 DIAGNOSIS — M47816 Spondylosis without myelopathy or radiculopathy, lumbar region: Secondary | ICD-10-CM | POA: Diagnosis not present

## 2018-04-09 DIAGNOSIS — Z4789 Encounter for other orthopedic aftercare: Secondary | ICD-10-CM | POA: Diagnosis not present

## 2018-04-09 DIAGNOSIS — M6281 Muscle weakness (generalized): Secondary | ICD-10-CM | POA: Diagnosis not present

## 2018-04-09 DIAGNOSIS — R2689 Other abnormalities of gait and mobility: Secondary | ICD-10-CM | POA: Diagnosis not present

## 2018-04-12 DIAGNOSIS — Z4789 Encounter for other orthopedic aftercare: Secondary | ICD-10-CM | POA: Diagnosis not present

## 2018-04-12 DIAGNOSIS — M6281 Muscle weakness (generalized): Secondary | ICD-10-CM | POA: Diagnosis not present

## 2018-04-12 DIAGNOSIS — R2689 Other abnormalities of gait and mobility: Secondary | ICD-10-CM | POA: Diagnosis not present

## 2018-04-13 DIAGNOSIS — Z4789 Encounter for other orthopedic aftercare: Secondary | ICD-10-CM | POA: Diagnosis not present

## 2018-04-13 DIAGNOSIS — M6281 Muscle weakness (generalized): Secondary | ICD-10-CM | POA: Diagnosis not present

## 2018-04-13 DIAGNOSIS — R2689 Other abnormalities of gait and mobility: Secondary | ICD-10-CM | POA: Diagnosis not present

## 2018-04-14 DIAGNOSIS — Z6836 Body mass index (BMI) 36.0-36.9, adult: Secondary | ICD-10-CM | POA: Diagnosis not present

## 2018-04-14 DIAGNOSIS — N410 Acute prostatitis: Secondary | ICD-10-CM | POA: Diagnosis not present

## 2018-04-15 DIAGNOSIS — M6281 Muscle weakness (generalized): Secondary | ICD-10-CM | POA: Diagnosis not present

## 2018-04-15 DIAGNOSIS — R2689 Other abnormalities of gait and mobility: Secondary | ICD-10-CM | POA: Diagnosis not present

## 2018-04-15 DIAGNOSIS — Z4789 Encounter for other orthopedic aftercare: Secondary | ICD-10-CM | POA: Diagnosis not present

## 2018-04-18 DIAGNOSIS — R2689 Other abnormalities of gait and mobility: Secondary | ICD-10-CM | POA: Diagnosis not present

## 2018-04-18 DIAGNOSIS — M6281 Muscle weakness (generalized): Secondary | ICD-10-CM | POA: Diagnosis not present

## 2018-04-18 DIAGNOSIS — Z4789 Encounter for other orthopedic aftercare: Secondary | ICD-10-CM | POA: Diagnosis not present

## 2018-04-19 DIAGNOSIS — R2689 Other abnormalities of gait and mobility: Secondary | ICD-10-CM | POA: Diagnosis not present

## 2018-04-19 DIAGNOSIS — Z4789 Encounter for other orthopedic aftercare: Secondary | ICD-10-CM | POA: Diagnosis not present

## 2018-04-19 DIAGNOSIS — M6281 Muscle weakness (generalized): Secondary | ICD-10-CM | POA: Diagnosis not present

## 2018-04-21 DIAGNOSIS — R2689 Other abnormalities of gait and mobility: Secondary | ICD-10-CM | POA: Diagnosis not present

## 2018-04-21 DIAGNOSIS — Z4789 Encounter for other orthopedic aftercare: Secondary | ICD-10-CM | POA: Diagnosis not present

## 2018-04-21 DIAGNOSIS — M6281 Muscle weakness (generalized): Secondary | ICD-10-CM | POA: Diagnosis not present

## 2018-04-27 DIAGNOSIS — Z4789 Encounter for other orthopedic aftercare: Secondary | ICD-10-CM | POA: Diagnosis not present

## 2018-04-27 DIAGNOSIS — M6281 Muscle weakness (generalized): Secondary | ICD-10-CM | POA: Diagnosis not present

## 2018-04-27 DIAGNOSIS — R2689 Other abnormalities of gait and mobility: Secondary | ICD-10-CM | POA: Diagnosis not present

## 2018-04-28 DIAGNOSIS — Z4789 Encounter for other orthopedic aftercare: Secondary | ICD-10-CM | POA: Diagnosis not present

## 2018-04-28 DIAGNOSIS — R2689 Other abnormalities of gait and mobility: Secondary | ICD-10-CM | POA: Diagnosis not present

## 2018-04-28 DIAGNOSIS — M6281 Muscle weakness (generalized): Secondary | ICD-10-CM | POA: Diagnosis not present

## 2018-05-03 DIAGNOSIS — M6281 Muscle weakness (generalized): Secondary | ICD-10-CM | POA: Diagnosis not present

## 2018-05-03 DIAGNOSIS — Z4789 Encounter for other orthopedic aftercare: Secondary | ICD-10-CM | POA: Diagnosis not present

## 2018-05-03 DIAGNOSIS — R2689 Other abnormalities of gait and mobility: Secondary | ICD-10-CM | POA: Diagnosis not present

## 2018-05-09 DIAGNOSIS — L918 Other hypertrophic disorders of the skin: Secondary | ICD-10-CM | POA: Diagnosis not present

## 2018-05-09 DIAGNOSIS — L820 Inflamed seborrheic keratosis: Secondary | ICD-10-CM | POA: Diagnosis not present

## 2018-05-09 DIAGNOSIS — X32XXXD Exposure to sunlight, subsequent encounter: Secondary | ICD-10-CM | POA: Diagnosis not present

## 2018-05-09 DIAGNOSIS — L570 Actinic keratosis: Secondary | ICD-10-CM | POA: Diagnosis not present

## 2018-05-13 DIAGNOSIS — Z4789 Encounter for other orthopedic aftercare: Secondary | ICD-10-CM | POA: Diagnosis not present

## 2018-05-13 DIAGNOSIS — M6281 Muscle weakness (generalized): Secondary | ICD-10-CM | POA: Diagnosis not present

## 2018-05-13 DIAGNOSIS — R2689 Other abnormalities of gait and mobility: Secondary | ICD-10-CM | POA: Diagnosis not present

## 2018-06-09 DIAGNOSIS — M47816 Spondylosis without myelopathy or radiculopathy, lumbar region: Secondary | ICD-10-CM | POA: Diagnosis not present

## 2018-06-09 DIAGNOSIS — M4326 Fusion of spine, lumbar region: Secondary | ICD-10-CM | POA: Diagnosis not present

## 2018-06-09 DIAGNOSIS — Z981 Arthrodesis status: Secondary | ICD-10-CM | POA: Diagnosis not present

## 2018-07-06 DIAGNOSIS — M7138 Other bursal cyst, other site: Secondary | ICD-10-CM | POA: Diagnosis not present

## 2018-07-06 DIAGNOSIS — Z09 Encounter for follow-up examination after completed treatment for conditions other than malignant neoplasm: Secondary | ICD-10-CM | POA: Diagnosis not present

## 2018-07-06 DIAGNOSIS — M4716 Other spondylosis with myelopathy, lumbar region: Secondary | ICD-10-CM | POA: Diagnosis not present

## 2018-07-18 DIAGNOSIS — R531 Weakness: Secondary | ICD-10-CM | POA: Diagnosis not present

## 2018-07-18 DIAGNOSIS — M4716 Other spondylosis with myelopathy, lumbar region: Secondary | ICD-10-CM | POA: Diagnosis not present

## 2018-07-18 DIAGNOSIS — R262 Difficulty in walking, not elsewhere classified: Secondary | ICD-10-CM | POA: Diagnosis not present

## 2018-07-21 DIAGNOSIS — M4716 Other spondylosis with myelopathy, lumbar region: Secondary | ICD-10-CM | POA: Diagnosis not present

## 2018-07-21 DIAGNOSIS — R531 Weakness: Secondary | ICD-10-CM | POA: Diagnosis not present

## 2018-07-21 DIAGNOSIS — R262 Difficulty in walking, not elsewhere classified: Secondary | ICD-10-CM | POA: Diagnosis not present

## 2018-08-01 DIAGNOSIS — M25852 Other specified joint disorders, left hip: Secondary | ICD-10-CM | POA: Diagnosis not present

## 2018-08-31 DIAGNOSIS — M47816 Spondylosis without myelopathy or radiculopathy, lumbar region: Secondary | ICD-10-CM | POA: Diagnosis not present

## 2018-09-05 DIAGNOSIS — E7800 Pure hypercholesterolemia, unspecified: Secondary | ICD-10-CM | POA: Diagnosis not present

## 2018-09-05 DIAGNOSIS — K5732 Diverticulitis of large intestine without perforation or abscess without bleeding: Secondary | ICD-10-CM | POA: Diagnosis not present

## 2018-09-05 DIAGNOSIS — R7301 Impaired fasting glucose: Secondary | ICD-10-CM | POA: Diagnosis not present

## 2018-09-05 DIAGNOSIS — K7581 Nonalcoholic steatohepatitis (NASH): Secondary | ICD-10-CM | POA: Diagnosis not present

## 2018-09-09 DIAGNOSIS — Z0000 Encounter for general adult medical examination without abnormal findings: Secondary | ICD-10-CM | POA: Diagnosis not present

## 2018-09-09 DIAGNOSIS — Z6835 Body mass index (BMI) 35.0-35.9, adult: Secondary | ICD-10-CM | POA: Diagnosis not present

## 2018-09-16 ENCOUNTER — Ambulatory Visit (INDEPENDENT_AMBULATORY_CARE_PROVIDER_SITE_OTHER): Payer: Medicare Other | Admitting: Internal Medicine

## 2018-09-16 ENCOUNTER — Encounter (INDEPENDENT_AMBULATORY_CARE_PROVIDER_SITE_OTHER): Payer: Self-pay | Admitting: Internal Medicine

## 2018-09-16 ENCOUNTER — Encounter (INDEPENDENT_AMBULATORY_CARE_PROVIDER_SITE_OTHER): Payer: Self-pay | Admitting: *Deleted

## 2018-09-16 VITALS — BP 190/90 | HR 76 | Temp 98.1°F | Ht 73.0 in | Wt 295.0 lb

## 2018-09-16 DIAGNOSIS — R1319 Other dysphagia: Secondary | ICD-10-CM

## 2018-09-16 DIAGNOSIS — R1310 Dysphagia, unspecified: Secondary | ICD-10-CM | POA: Diagnosis not present

## 2018-09-16 DIAGNOSIS — K633 Ulcer of intestine: Secondary | ICD-10-CM | POA: Diagnosis not present

## 2018-09-16 MED ORDER — PANTOPRAZOLE SODIUM 40 MG PO TBEC: 40.0000 mg | DELAYED_RELEASE_TABLET | Freq: Every day | ORAL | 4 refills | Status: DC

## 2018-09-16 NOTE — Patient Instructions (Addendum)
OV in one year. DG. Esphagram.

## 2018-09-16 NOTE — Progress Notes (Signed)
Subjective:    Patient ID: Bruce Stewart, male    DOB: October 31, 1946, 72 y.o.   MRN: [id number] PCP Dr. Quintin Alto.  HPI Recently tested negative for Hep C at [place] in [place]. Here today for f/u. Last seen in May of this year. Underwent a colonoscopy in April of this year for Rt lower quadrant pain. Impression:               - Two small ulcers in the terminal ileum. Biopsied.                            Biopsy taken from surrounding mucosa.                           - Petechia(e) in the proximal ascending colon.                            Mucosa normal.                           - Diverticulosis in the sigmoid colon.                           - The distal rectum and anal verge are normal on                            retroflexion view. He tells me his rt side has pain when he has a BM. Has some pain mid left abdomen. Last night, he occasionally has dysphagia. Has to drink water for the bolus to go down.  Last night he had problem eating pasta. No problems with meats.  His appetite is good. He has lost 6 pounds since his last visit.  BM x 3 a day.  Review of Systems Past Medical History:  Diagnosis Date  . Anal fissure   . Diverticulitis   . Family hx of colon cancer   . Kidney stone     Past Surgical History:  Procedure Laterality Date  . back surgery x 2    . BIOPSY  02/11/2018   Procedure: BIOPSY;  Surgeon: Rogene Houston, MD;  [facility];  Service: Endoscopy;;  ileal  . CIRCUMCISION    . COLONOSCOPY N/A 09/12/2015   Procedure: COLONOSCOPY;  Surgeon: Rogene Houston, MD;  [facility];  Service: Endoscopy;  Laterality: N/A;  2:25-moved up to [place] notified pt  . COLONOSCOPY N/A 02/11/2018   Procedure: COLONOSCOPY;  Surgeon: Rogene Houston, MD;  [facility];  Service: Endoscopy;  Laterality: N/A;  9:15-moved to [address] to notify pt  . repair of anal fissure     spincterotomy    Allergies  Allergen Reactions  . Codeine Itching  .  Orange Fruit [Citrus] Itching  . Bee Venom Rash    Current Outpatient Medications on File Prior to Visit  Medication Sig Dispense Refill  . acetaminophen (TYLENOL) 500 MG tablet Take 1,000 mg by mouth every 8 (eight) hours as needed for mild pain or moderate pain.    Marland Kitchen Apple Cider Vinegar 600 MG CAPS Take 1 capsule by mouth daily.    . Calcium-Magnesium-Zinc (CAL-MAG-ZINC PO) Take 1 tablet by mouth at bedtime.    . Cholecalciferol  5000 units TABS Take 5,000 Units by mouth daily.     Marland Kitchen Fish Oil-Cholecalciferol (FISH OIL + D3 PO) Take 1,400 mg by mouth daily.     Marland Kitchen gabapentin (NEURONTIN) 300 MG capsule Take 600 mg by mouth 3 (three) times daily.     Marland Kitchen HYDROcodone-acetaminophen (NORCO/VICODIN) 5-325 MG tablet Take 1 tablet by mouth every 6 (six) hours as needed for moderate pain.    . Multiple Vitamin (MULTIVITAMIN WITH MINERALS) TABS tablet Take 1 tablet by mouth daily.    . tamsulosin (FLOMAX) 0.4 MG CAPS capsule Take 0.4 mg by mouth daily.     Marland Kitchen tiZANidine (ZANAFLEX) 4 MG capsule Take 4 mg by mouth 3 (three) times daily.     No current facility-administered medications on file prior to visit.         Objective:   Physical Exam Blood pressure (!) 190/90, pulse 76, temperature 98.1 F (36.7 C), height 6\' 1"  (1.854 m), weight 295 lb (133.8 kg). Alert and oriented. Skin warm and dry. Oral mucosa is moist.   . Sclera anicteric, conjunctivae is pink. Thyroid not enlarged. No cervical lymphadenopathy. Lungs clear. Heart regular rate and rhythm.  Abdomen is soft. Bowel sounds are positive. No hepatomegaly. No abdominal masses felt. No tenderness.  No edema to lower extremities.           Assessment & Plan:  Colonic ulcer, Rt lower quadrant pain. Rx for Protonix 40mg  daily. Dysphagia. Am going to get an esophagram.

## 2018-09-20 ENCOUNTER — Ambulatory Visit (HOSPITAL_COMMUNITY)
Admission: RE | Admit: 2018-09-20 | Discharge: 2018-09-20 | Disposition: A | Discharge status: Home / Self Care | Payer: Medicare Other | Source: Ambulatory Visit | Attending: Internal Medicine | Admitting: Internal Medicine

## 2018-09-20 DIAGNOSIS — K224 Dyskinesia of esophagus: Secondary | ICD-10-CM | POA: Diagnosis not present

## 2018-09-20 DIAGNOSIS — R1310 Dysphagia, unspecified: Secondary | ICD-10-CM | POA: Insufficient documentation

## 2018-09-20 DIAGNOSIS — R1319 Other dysphagia: Secondary | ICD-10-CM

## 2018-09-20 DIAGNOSIS — T17308A Unspecified foreign body in larynx causing other injury, initial encounter: Secondary | ICD-10-CM | POA: Diagnosis not present

## 2018-09-20 IMAGING — RF DG ESOPHAGUS
11 series · 12 of 24 positions shown · non-contrast
Comparison: None

CLINICAL DATA: Difficulty swallowing

EXAM:
ESOPHOGRAM / BARIUM SWALLOW / BARIUM TABLET STUDY
TECHNIQUE: Combined double contrast and single contrast examination performed
using effervescent crystals, thick barium liquid, and thin barium
liquid. The patient was observed with fluoroscopy swallowing a 13 mm
barium sulphate tablet.
FLUOROSCOPY TIME:  Fluoroscopy Time:  1 minutes 36 seconds
Radiation Exposure Index (if provided by the fluoroscopic device):
48.6 mGy
Number of Acquired Spot Images: multiple fluoroscopic screen
captures

[Series 1: cp_standard · 0.17mm/px · 1 of 65 frames shown (1 of 11)]
[frame 45/65]
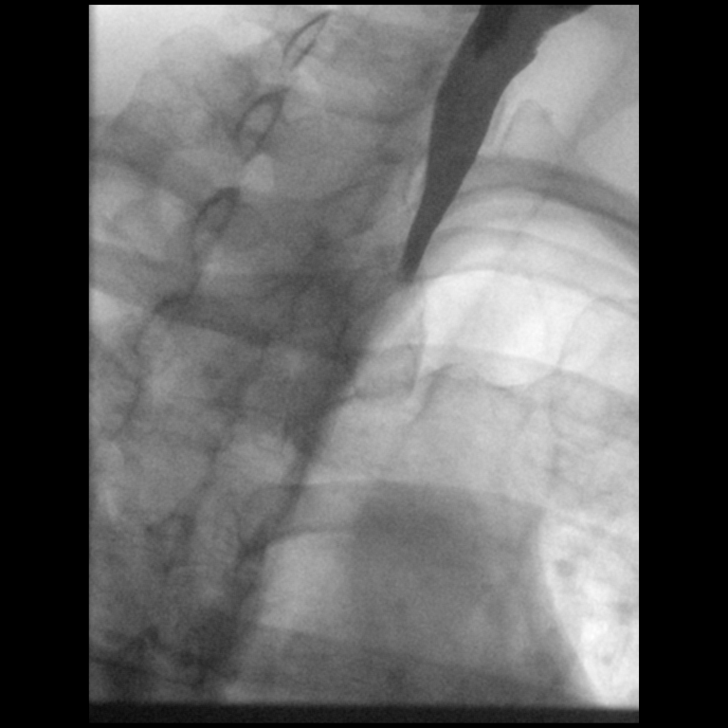

[Series 2: cp_standard · 0.17mm/px · 1 of 66 frames shown (2 of 11)]
[frame 10/66]
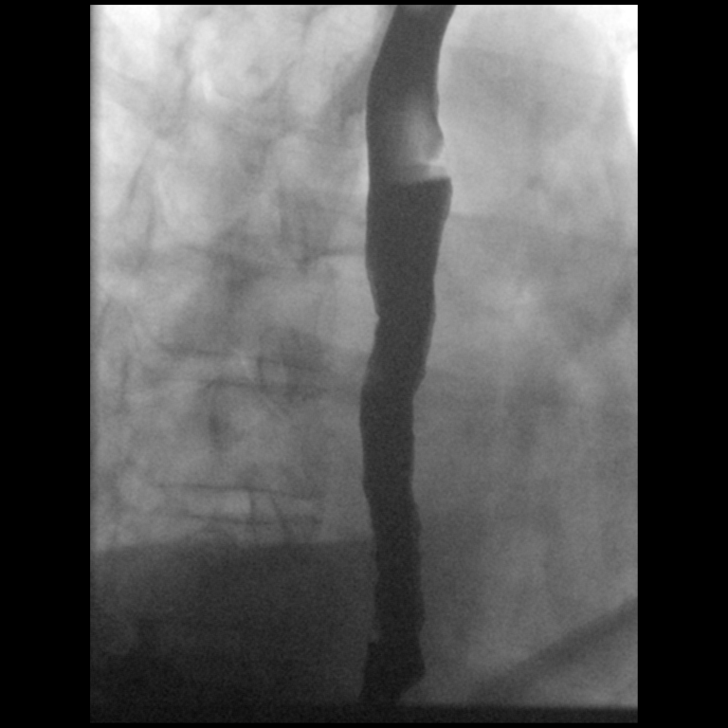

[Series 3: cp_standard · 0.18mm/px · 1 of 56 frames shown (3 of 11)]
[frame 9/56]
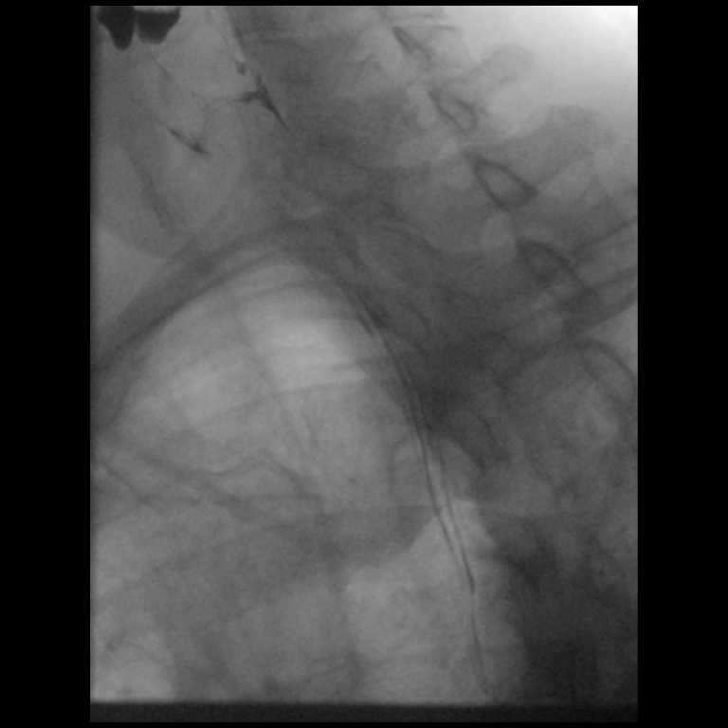

[Series 4: cp_standard · 0.18mm/px · 1 of 97 frames shown (4 of 11)]
[frame 15/97]
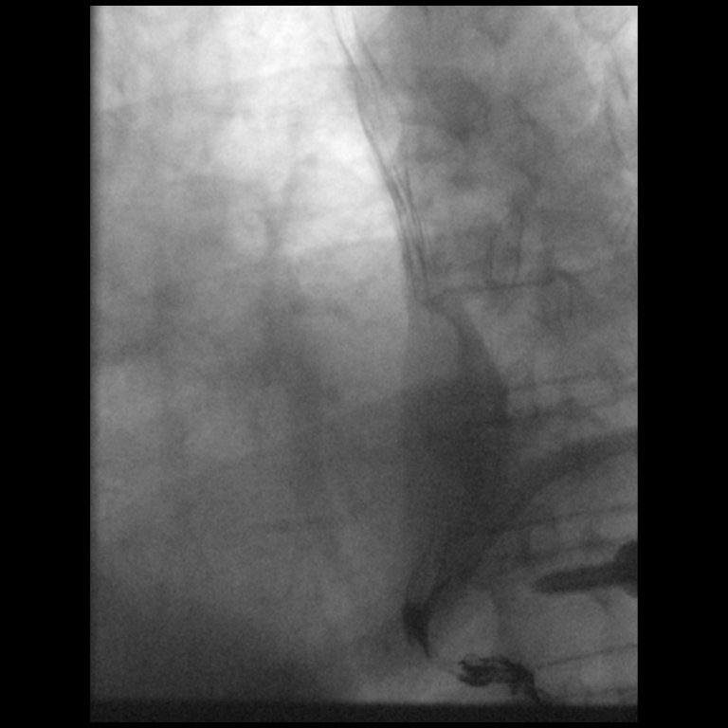

[Series 5: cp_standard · 0.27mm/px · 1 of 139 frames shown (5 of 11)]
[frame 21/139]
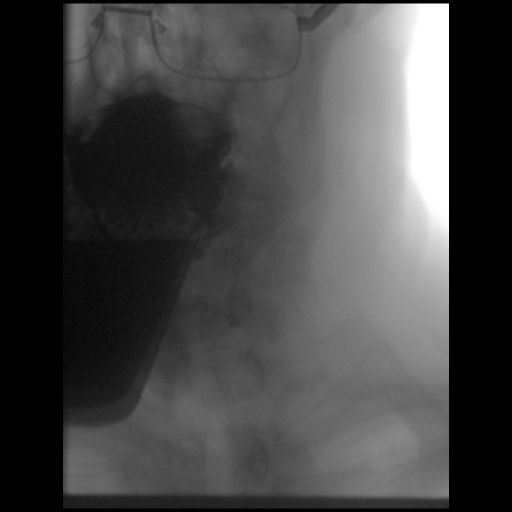

[Series 6: cp_standard · 0.25mm/px · 2 of 121 frames shown (6 of 11)]
[frame 19/121]
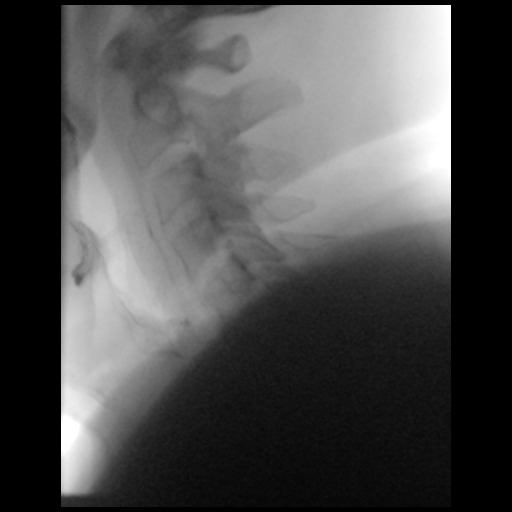
[frame 103/121]
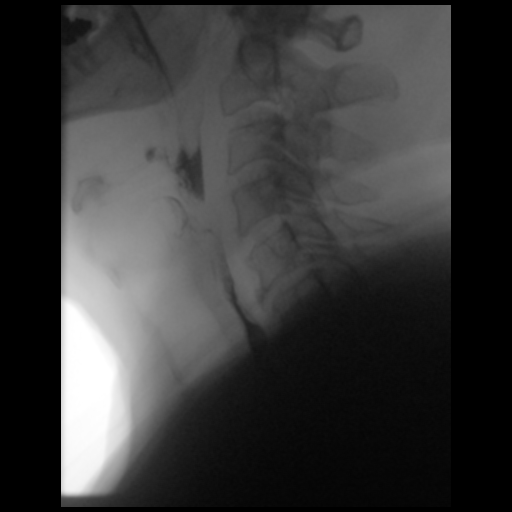

[Series 7: cp_standard · 0.25mm/px · 1 of 116 frames shown (7 of 11)]
[frame 99/116]
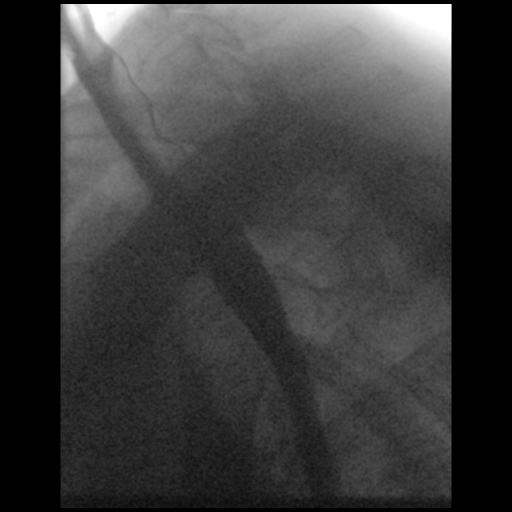

[Series 8: cp_standard · 0.17mm/px · 1 of 15 frames shown (8 of 11)]
[frame 13/15]
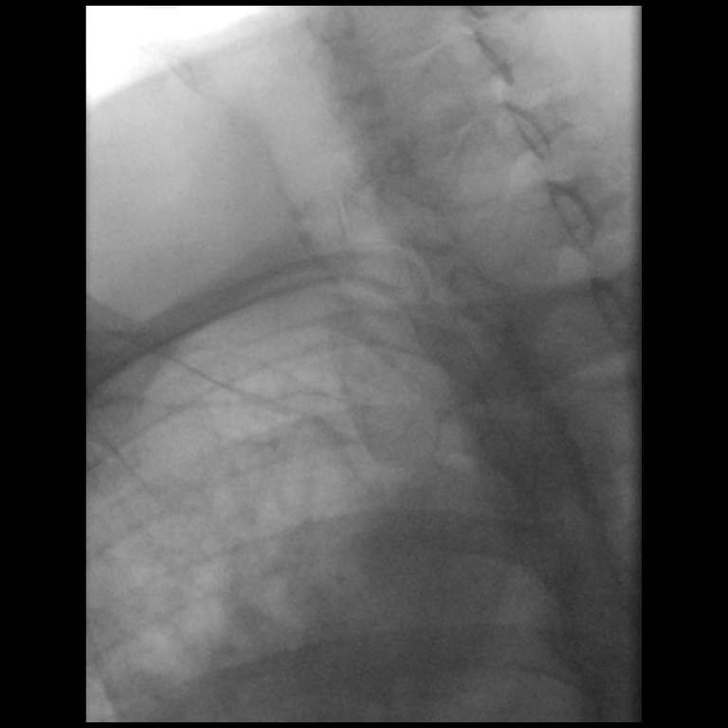

[Series 9: cp_standard · 0.17mm/px · 1 of 48 frames shown (9 of 11)]
[frame 41/48]
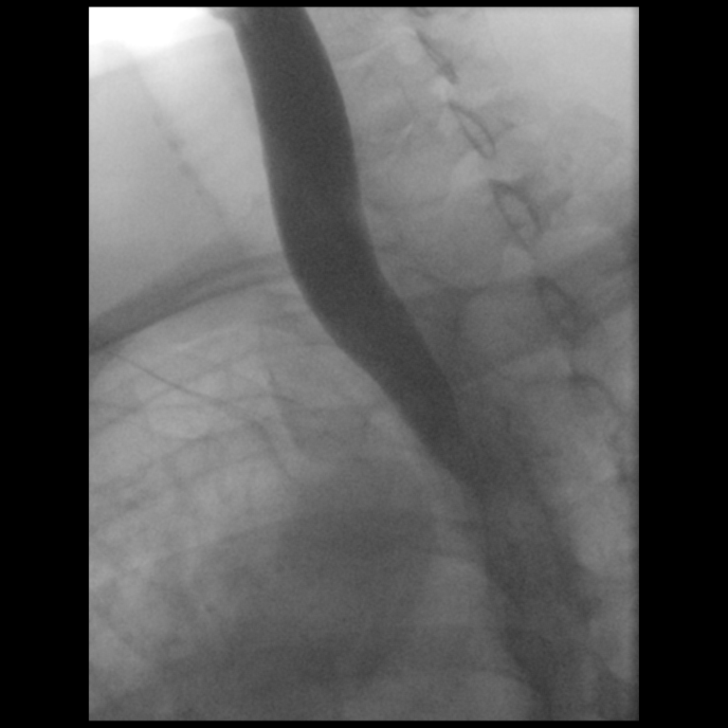

[Series 10: cp_standard · 0.18mm/px · 1 of 10 frames shown (10 of 11)]
[frame 9/10]
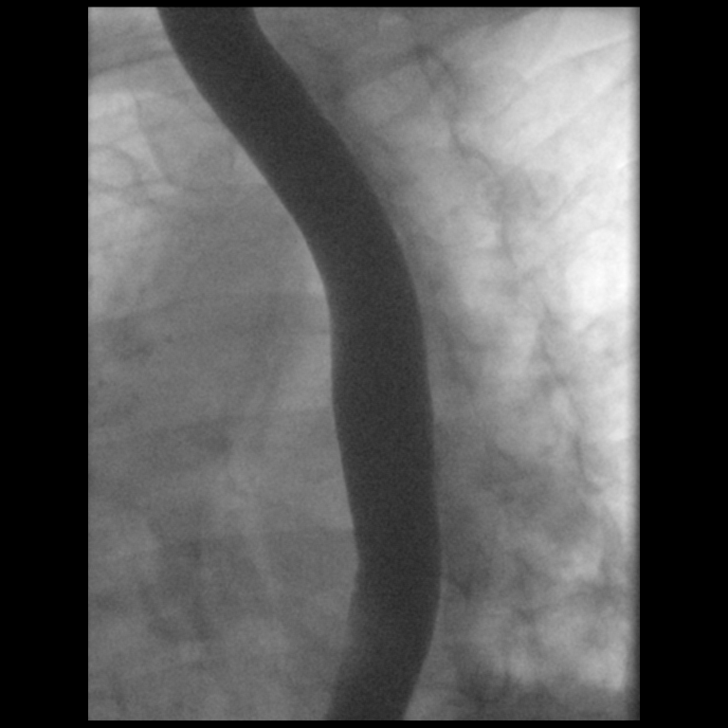

[Series 11: cp_standard · 0.18mm/px · 1 of 102 frames shown (11 of 11)]
[frame 98/102]
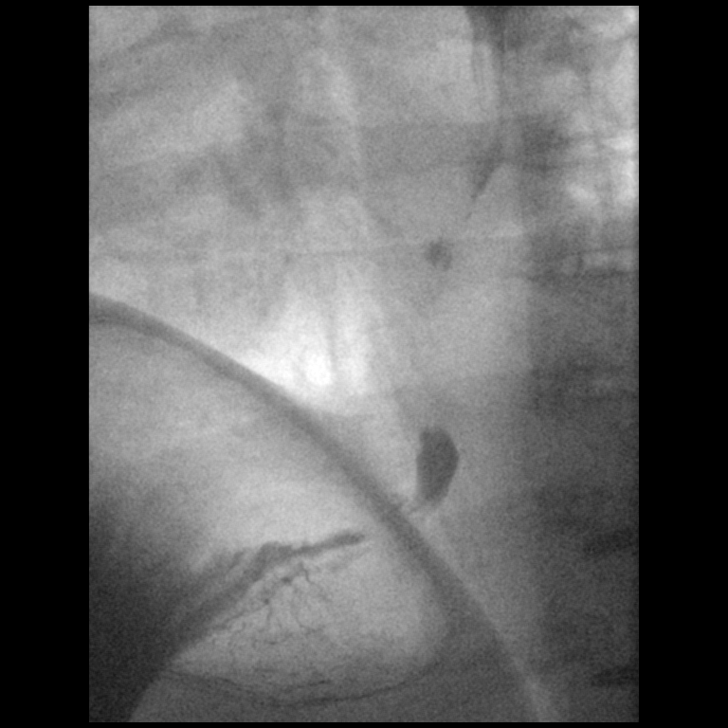

[12 of 24 positions shown; findings below may reference images not displayed]

FINDINGS: Esophageal distention: Normal without mass or stricture

Filling defects:  None

12.5 mm barium tablet: Easily passed from oral cavity to stomach
without delay

Motility: Mild age-related dysmotility with incomplete clearance of
barium by primary peristaltic waves. Scattered secondary and
tertiary waves with note of intermittent retrograde peristalsis.

Mucosa:  Smooth without irregularity or ulceration

Hypopharynx/cervical esophagus: Minimal laryngeal penetration. No
aspiration. Minimal vallecular residuals

Hiatal hernia:  Absent

GE reflux:  Not witnessed during exam

Other:  N/A
IMPRESSION: Age-related esophageal dysmotility as above.

Minimal laryngeal penetration and vallecular residuals without
aspiration.

## 2018-09-22 ENCOUNTER — Other Ambulatory Visit: Payer: Self-pay

## 2018-10-17 DIAGNOSIS — M47816 Spondylosis without myelopathy or radiculopathy, lumbar region: Secondary | ICD-10-CM | POA: Diagnosis not present

## 2018-10-17 DIAGNOSIS — M545 Low back pain: Secondary | ICD-10-CM | POA: Diagnosis not present

## 2018-10-19 DIAGNOSIS — M47816 Spondylosis without myelopathy or radiculopathy, lumbar region: Secondary | ICD-10-CM | POA: Diagnosis not present

## 2018-10-19 DIAGNOSIS — M7138 Other bursal cyst, other site: Secondary | ICD-10-CM | POA: Diagnosis not present

## 2018-11-14 ENCOUNTER — Telehealth (INDEPENDENT_AMBULATORY_CARE_PROVIDER_SITE_OTHER): Payer: Self-pay | Admitting: Internal Medicine

## 2018-11-14 DIAGNOSIS — K633 Ulcer of intestine: Secondary | ICD-10-CM

## 2018-11-14 MED ORDER — PANTOPRAZOLE SODIUM 40 MG PO TBEC: 40.0000 mg | DELAYED_RELEASE_TABLET | Freq: Every day | ORAL | 4 refills | Status: DC

## 2018-11-14 NOTE — Telephone Encounter (Signed)
Rx for Protonix sent to his pharmacy 

## 2018-11-14 NOTE — Telephone Encounter (Signed)
err

## 2018-11-16 DIAGNOSIS — M7138 Other bursal cyst, other site: Secondary | ICD-10-CM | POA: Diagnosis not present

## 2018-11-16 DIAGNOSIS — M47816 Spondylosis without myelopathy or radiculopathy, lumbar region: Secondary | ICD-10-CM | POA: Diagnosis not present

## 2018-12-29 DIAGNOSIS — M7138 Other bursal cyst, other site: Secondary | ICD-10-CM | POA: Diagnosis not present

## 2018-12-29 DIAGNOSIS — M47816 Spondylosis without myelopathy or radiculopathy, lumbar region: Secondary | ICD-10-CM | POA: Diagnosis not present

## 2019-01-07 DIAGNOSIS — Z79899 Other long term (current) drug therapy: Secondary | ICD-10-CM | POA: Diagnosis not present

## 2019-01-07 DIAGNOSIS — R112 Nausea with vomiting, unspecified: Secondary | ICD-10-CM | POA: Diagnosis not present

## 2019-01-07 DIAGNOSIS — R42 Dizziness and giddiness: Secondary | ICD-10-CM | POA: Diagnosis not present

## 2019-02-16 DIAGNOSIS — Z6836 Body mass index (BMI) 36.0-36.9, adult: Secondary | ICD-10-CM | POA: Diagnosis not present

## 2019-02-16 DIAGNOSIS — H8110 Benign paroxysmal vertigo, unspecified ear: Secondary | ICD-10-CM | POA: Diagnosis not present

## 2019-03-02 DIAGNOSIS — E7800 Pure hypercholesterolemia, unspecified: Secondary | ICD-10-CM | POA: Diagnosis not present

## 2019-03-02 DIAGNOSIS — N183 Chronic kidney disease, stage 3 (moderate): Secondary | ICD-10-CM | POA: Diagnosis not present

## 2019-03-02 DIAGNOSIS — R635 Abnormal weight gain: Secondary | ICD-10-CM | POA: Diagnosis not present

## 2019-03-07 DIAGNOSIS — B354 Tinea corporis: Secondary | ICD-10-CM | POA: Diagnosis not present

## 2019-03-07 DIAGNOSIS — N644 Mastodynia: Secondary | ICD-10-CM | POA: Diagnosis not present

## 2019-03-07 DIAGNOSIS — K7581 Nonalcoholic steatohepatitis (NASH): Secondary | ICD-10-CM | POA: Diagnosis not present

## 2019-03-07 DIAGNOSIS — N183 Chronic kidney disease, stage 3 (moderate): Secondary | ICD-10-CM | POA: Diagnosis not present

## 2019-04-22 DIAGNOSIS — Z79899 Other long term (current) drug therapy: Secondary | ICD-10-CM | POA: Diagnosis not present

## 2019-04-22 DIAGNOSIS — S61411A Laceration without foreign body of right hand, initial encounter: Secondary | ICD-10-CM | POA: Diagnosis not present

## 2019-04-22 DIAGNOSIS — S61210A Laceration without foreign body of right index finger without damage to nail, initial encounter: Secondary | ICD-10-CM | POA: Diagnosis not present

## 2019-04-22 DIAGNOSIS — W312XXA Contact with powered woodworking and forming machines, initial encounter: Secondary | ICD-10-CM | POA: Diagnosis not present

## 2019-04-23 DIAGNOSIS — S61210D Laceration without foreign body of right index finger without damage to nail, subsequent encounter: Secondary | ICD-10-CM | POA: Diagnosis not present

## 2019-04-23 DIAGNOSIS — Z4801 Encounter for change or removal of surgical wound dressing: Secondary | ICD-10-CM | POA: Diagnosis not present

## 2019-04-23 DIAGNOSIS — Z4800 Encounter for change or removal of nonsurgical wound dressing: Secondary | ICD-10-CM | POA: Diagnosis not present

## 2019-04-23 DIAGNOSIS — Z79899 Other long term (current) drug therapy: Secondary | ICD-10-CM | POA: Diagnosis not present

## 2019-05-01 DIAGNOSIS — Z4802 Encounter for removal of sutures: Secondary | ICD-10-CM | POA: Diagnosis not present

## 2019-06-15 DIAGNOSIS — M169 Osteoarthritis of hip, unspecified: Secondary | ICD-10-CM | POA: Diagnosis not present

## 2019-06-20 DIAGNOSIS — M545 Low back pain: Secondary | ICD-10-CM | POA: Diagnosis not present

## 2019-06-20 DIAGNOSIS — M169 Osteoarthritis of hip, unspecified: Secondary | ICD-10-CM | POA: Diagnosis not present

## 2019-07-11 DIAGNOSIS — R1012 Left upper quadrant pain: Secondary | ICD-10-CM | POA: Diagnosis not present

## 2019-07-11 DIAGNOSIS — R42 Dizziness and giddiness: Secondary | ICD-10-CM | POA: Diagnosis not present

## 2019-07-11 DIAGNOSIS — Z87442 Personal history of urinary calculi: Secondary | ICD-10-CM | POA: Diagnosis not present

## 2019-07-11 DIAGNOSIS — Z79899 Other long term (current) drug therapy: Secondary | ICD-10-CM | POA: Diagnosis not present

## 2019-07-11 DIAGNOSIS — R61 Generalized hyperhidrosis: Secondary | ICD-10-CM | POA: Diagnosis not present

## 2019-07-11 DIAGNOSIS — R079 Chest pain, unspecified: Secondary | ICD-10-CM | POA: Diagnosis not present

## 2019-07-11 DIAGNOSIS — N179 Acute kidney failure, unspecified: Secondary | ICD-10-CM | POA: Diagnosis not present

## 2019-07-28 DIAGNOSIS — N183 Chronic kidney disease, stage 3 (moderate): Secondary | ICD-10-CM | POA: Diagnosis not present

## 2019-08-09 ENCOUNTER — Other Ambulatory Visit: Payer: Self-pay | Admitting: Family Medicine

## 2019-08-09 DIAGNOSIS — M5440 Lumbago with sciatica, unspecified side: Secondary | ICD-10-CM

## 2019-08-11 ENCOUNTER — Other Ambulatory Visit: Payer: Self-pay

## 2019-08-11 ENCOUNTER — Ambulatory Visit
Admission: RE | Admit: 2019-08-11 | Discharge: 2019-08-11 | Disposition: A | Discharge status: Home / Self Care | Payer: Medicare Other | Source: Ambulatory Visit | Attending: Family Medicine | Admitting: Family Medicine

## 2019-08-11 DIAGNOSIS — M5416 Radiculopathy, lumbar region: Secondary | ICD-10-CM | POA: Diagnosis not present

## 2019-08-11 DIAGNOSIS — M5440 Lumbago with sciatica, unspecified side: Secondary | ICD-10-CM

## 2019-08-11 IMAGING — XA Imaging study
2 series · 2 of 2 positions shown · non-contrast
Comparison: none

CLINICAL DATA: Left lower extremity radicular symptoms. Extensive
thoracolumbar fusion. Residual subarticular stenosis.

[Series 1: ortho adipose · 1 of 1 slices shown (1 of 2)]
[im 1/1]
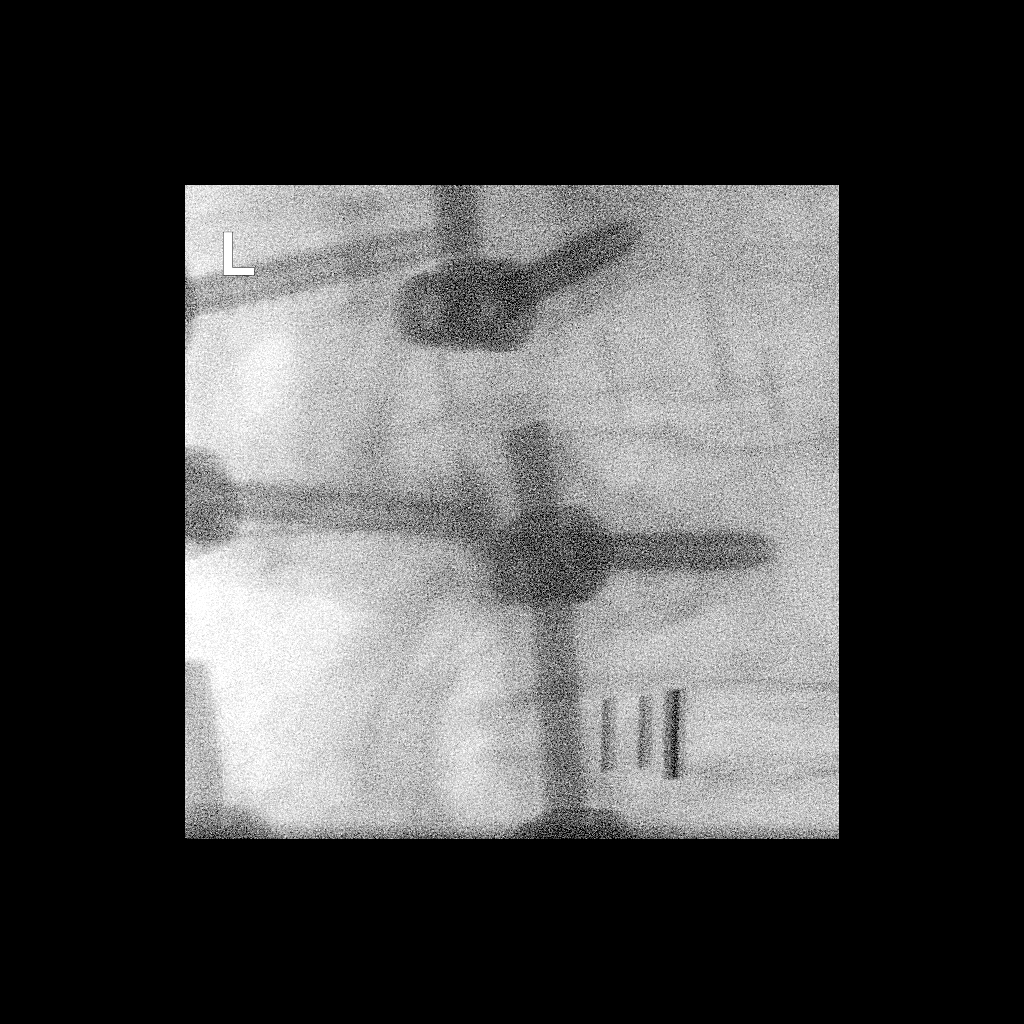

[Series 2: ortho adipose · 1 of 1 slices shown (2 of 2)]
[im 1/1]
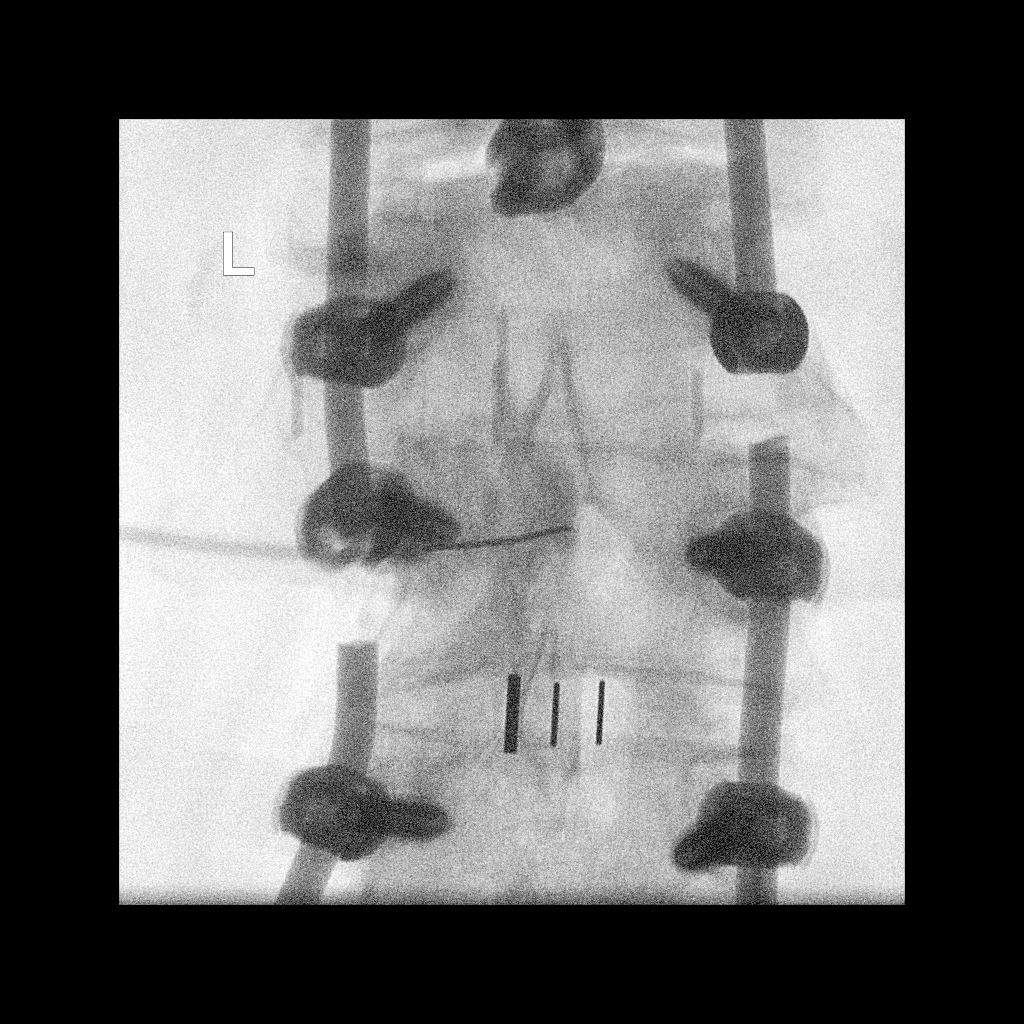

[2 of 2 positions shown; findings below may reference images not displayed]

FLUOROSCOPY TIME:  Radiation Exposure Index (as provided by the
fluoroscopic device): 134.80 uGy*m2

PROCEDURE:
The procedure, risks, benefits, and alternatives were explained to
the patient. Questions regarding the procedure were encouraged and
answered. The patient understands and consents to the procedure.

LUMBAR EPIDURAL INJECTION:

An interlaminar approach was performed on left at L2-3. The
overlying skin was cleansed and anesthetized. A 20 gauge epidural
needle was advanced using loss-of-resistance technique.

DIAGNOSTIC EPIDURAL INJECTION:

Injection of Isovue-M 200 shows a good epidural pattern with spread
above and below the level of needle placement, primarily on the left
no vascular opacification is seen.

THERAPEUTIC EPIDURAL INJECTION:

120 mg of Depo-Medrol mixed with 3 mL 1% lidocaine were instilled.
The procedure was well-tolerated, and the patient was discharged
thirty minutes following the injection in good condition.

COMPLICATIONS:
None
IMPRESSION: Technically successful epidural injection on the left L2-3 # 1

## 2019-08-11 MED ORDER — IOPAMIDOL (ISOVUE-M 200) INJECTION 41%
1.0000 mL | Freq: Once | INTRAMUSCULAR | Status: AC
  Administered 2019-08-11: 1 mL via EPIDURAL

## 2019-08-11 MED ORDER — METHYLPREDNISOLONE ACETATE 40 MG/ML INJ SUSP (RADIOLOG
120.0000 mg | Freq: Once | INTRAMUSCULAR | Status: AC
  Administered 2019-08-11: 16:00:00 120 mg via EPIDURAL

## 2019-08-11 NOTE — Discharge Instructions (Signed)

## 2019-09-06 DIAGNOSIS — R42 Dizziness and giddiness: Secondary | ICD-10-CM | POA: Diagnosis not present

## 2019-09-18 DIAGNOSIS — Z0000 Encounter for general adult medical examination without abnormal findings: Secondary | ICD-10-CM | POA: Diagnosis not present

## 2019-09-18 DIAGNOSIS — R7301 Impaired fasting glucose: Secondary | ICD-10-CM | POA: Diagnosis not present

## 2019-09-18 DIAGNOSIS — K7581 Nonalcoholic steatohepatitis (NASH): Secondary | ICD-10-CM | POA: Diagnosis not present

## 2019-09-18 DIAGNOSIS — E7800 Pure hypercholesterolemia, unspecified: Secondary | ICD-10-CM | POA: Diagnosis not present

## 2019-10-03 DIAGNOSIS — I219 Acute myocardial infarction, unspecified: Secondary | ICD-10-CM

## 2019-10-03 HISTORY — PX: CORONARY ANGIOPLASTY WITH STENT PLACEMENT: SHX49

## 2019-10-03 HISTORY — PX: CARDIAC CATHETERIZATION: SHX172

## 2019-10-03 HISTORY — DX: Acute myocardial infarction, unspecified: I21.9

## 2019-10-04 DIAGNOSIS — N189 Chronic kidney disease, unspecified: Secondary | ICD-10-CM | POA: Diagnosis not present

## 2019-10-04 DIAGNOSIS — I519 Heart disease, unspecified: Secondary | ICD-10-CM | POA: Diagnosis not present

## 2019-10-04 DIAGNOSIS — Z981 Arthrodesis status: Secondary | ICD-10-CM | POA: Diagnosis not present

## 2019-10-04 DIAGNOSIS — R61 Generalized hyperhidrosis: Secondary | ICD-10-CM | POA: Diagnosis not present

## 2019-10-04 DIAGNOSIS — K219 Gastro-esophageal reflux disease without esophagitis: Secondary | ICD-10-CM | POA: Diagnosis not present

## 2019-10-04 DIAGNOSIS — I129 Hypertensive chronic kidney disease with stage 1 through stage 4 chronic kidney disease, or unspecified chronic kidney disease: Secondary | ICD-10-CM | POA: Diagnosis not present

## 2019-10-04 DIAGNOSIS — Z87442 Personal history of urinary calculi: Secondary | ICD-10-CM | POA: Diagnosis not present

## 2019-10-04 DIAGNOSIS — G8929 Other chronic pain: Secondary | ICD-10-CM | POA: Diagnosis not present

## 2019-10-04 DIAGNOSIS — I700 Atherosclerosis of aorta: Secondary | ICD-10-CM | POA: Diagnosis not present

## 2019-10-04 DIAGNOSIS — M79602 Pain in left arm: Secondary | ICD-10-CM | POA: Diagnosis not present

## 2019-10-04 DIAGNOSIS — Z91018 Allergy to other foods: Secondary | ICD-10-CM | POA: Diagnosis not present

## 2019-10-04 DIAGNOSIS — I214 Non-ST elevation (NSTEMI) myocardial infarction: Secondary | ICD-10-CM | POA: Diagnosis not present

## 2019-10-04 DIAGNOSIS — R079 Chest pain, unspecified: Secondary | ICD-10-CM | POA: Diagnosis not present

## 2019-10-04 DIAGNOSIS — I4510 Unspecified right bundle-branch block: Secondary | ICD-10-CM | POA: Diagnosis not present

## 2019-10-04 DIAGNOSIS — I2510 Atherosclerotic heart disease of native coronary artery without angina pectoris: Secondary | ICD-10-CM | POA: Diagnosis not present

## 2019-10-04 DIAGNOSIS — E7800 Pure hypercholesterolemia, unspecified: Secondary | ICD-10-CM | POA: Diagnosis not present

## 2019-10-04 DIAGNOSIS — R110 Nausea: Secondary | ICD-10-CM | POA: Diagnosis not present

## 2019-10-04 DIAGNOSIS — Z79899 Other long term (current) drug therapy: Secondary | ICD-10-CM | POA: Diagnosis not present

## 2019-10-04 DIAGNOSIS — R54 Age-related physical debility: Secondary | ICD-10-CM | POA: Diagnosis not present

## 2019-10-04 DIAGNOSIS — R0602 Shortness of breath: Secondary | ICD-10-CM | POA: Diagnosis not present

## 2019-10-18 DIAGNOSIS — I2510 Atherosclerotic heart disease of native coronary artery without angina pectoris: Secondary | ICD-10-CM | POA: Diagnosis not present

## 2019-10-18 DIAGNOSIS — N183 Chronic kidney disease, stage 3 unspecified: Secondary | ICD-10-CM | POA: Diagnosis not present

## 2019-10-18 DIAGNOSIS — R42 Dizziness and giddiness: Secondary | ICD-10-CM | POA: Diagnosis not present

## 2019-10-18 DIAGNOSIS — M545 Low back pain: Secondary | ICD-10-CM | POA: Diagnosis not present

## 2019-10-18 DIAGNOSIS — R5383 Other fatigue: Secondary | ICD-10-CM | POA: Diagnosis not present

## 2019-10-28 ENCOUNTER — Telehealth (HOSPITAL_COMMUNITY): Payer: Self-pay | Admitting: *Deleted

## 2019-10-28 NOTE — Telephone Encounter (Signed)
Called patient to inform about [place] closing for couple of months. Informed of the option to do virtual program. He siad he wanted to check on a few things and he would call us back before New Year to let us know if he wanted virtual or not.  Eliott [employer]par[employer]

## 2019-11-05 DIAGNOSIS — M545 Low back pain: Secondary | ICD-10-CM | POA: Diagnosis not present

## 2019-11-05 DIAGNOSIS — R5383 Other fatigue: Secondary | ICD-10-CM | POA: Diagnosis not present

## 2019-11-05 DIAGNOSIS — R42 Dizziness and giddiness: Secondary | ICD-10-CM | POA: Diagnosis not present

## 2019-11-05 DIAGNOSIS — N183 Chronic kidney disease, stage 3 unspecified: Secondary | ICD-10-CM | POA: Diagnosis not present

## 2019-11-05 DIAGNOSIS — I2510 Atherosclerotic heart disease of native coronary artery without angina pectoris: Secondary | ICD-10-CM | POA: Diagnosis not present

## 2019-11-14 DIAGNOSIS — E785 Hyperlipidemia, unspecified: Secondary | ICD-10-CM | POA: Diagnosis not present

## 2019-11-14 DIAGNOSIS — I10 Essential (primary) hypertension: Secondary | ICD-10-CM | POA: Diagnosis not present

## 2019-11-14 DIAGNOSIS — I2510 Atherosclerotic heart disease of native coronary artery without angina pectoris: Secondary | ICD-10-CM | POA: Diagnosis not present

## 2019-11-14 DIAGNOSIS — I252 Old myocardial infarction: Secondary | ICD-10-CM | POA: Diagnosis not present

## 2019-12-04 ENCOUNTER — Other Ambulatory Visit: Payer: Self-pay | Admitting: Family Medicine

## 2019-12-14 ENCOUNTER — Telehealth (HOSPITAL_COMMUNITY): Payer: Self-pay

## 2019-12-14 NOTE — Telephone Encounter (Signed)
Called patient regarding referral to Cardiac Rehab post NSTEMI/PCI. Left voice mail for patient to return call to discuss the program and his interest in participating. Will follow-up if and when he calls.

## 2019-12-20 ENCOUNTER — Telehealth (HOSPITAL_COMMUNITY): Payer: Self-pay | Admitting: *Deleted

## 2019-12-20 NOTE — Telephone Encounter (Signed)
Patient called to let us know that the [place] says they have a contract with [facility] and that they would not change that at this time. He will have to complete his CR at [facility].

## 2019-12-25 DIAGNOSIS — R079 Chest pain, unspecified: Secondary | ICD-10-CM | POA: Diagnosis not present

## 2019-12-25 DIAGNOSIS — R1110 Vomiting, unspecified: Secondary | ICD-10-CM | POA: Diagnosis not present

## 2020-02-12 DIAGNOSIS — E785 Hyperlipidemia, unspecified: Secondary | ICD-10-CM | POA: Diagnosis not present

## 2020-02-12 DIAGNOSIS — I10 Essential (primary) hypertension: Secondary | ICD-10-CM | POA: Diagnosis not present

## 2020-02-12 DIAGNOSIS — I2510 Atherosclerotic heart disease of native coronary artery without angina pectoris: Secondary | ICD-10-CM | POA: Diagnosis not present

## 2020-02-23 DIAGNOSIS — M9903 Segmental and somatic dysfunction of lumbar region: Secondary | ICD-10-CM | POA: Diagnosis not present

## 2020-02-23 DIAGNOSIS — M47816 Spondylosis without myelopathy or radiculopathy, lumbar region: Secondary | ICD-10-CM | POA: Diagnosis not present

## 2020-02-23 DIAGNOSIS — M5136 Other intervertebral disc degeneration, lumbar region: Secondary | ICD-10-CM | POA: Diagnosis not present

## 2020-02-26 DIAGNOSIS — M47816 Spondylosis without myelopathy or radiculopathy, lumbar region: Secondary | ICD-10-CM | POA: Diagnosis not present

## 2020-02-26 DIAGNOSIS — M5136 Other intervertebral disc degeneration, lumbar region: Secondary | ICD-10-CM | POA: Diagnosis not present

## 2020-02-26 DIAGNOSIS — M9903 Segmental and somatic dysfunction of lumbar region: Secondary | ICD-10-CM | POA: Diagnosis not present

## 2020-02-28 DIAGNOSIS — M47816 Spondylosis without myelopathy or radiculopathy, lumbar region: Secondary | ICD-10-CM | POA: Diagnosis not present

## 2020-02-28 DIAGNOSIS — M5136 Other intervertebral disc degeneration, lumbar region: Secondary | ICD-10-CM | POA: Diagnosis not present

## 2020-02-28 DIAGNOSIS — M9903 Segmental and somatic dysfunction of lumbar region: Secondary | ICD-10-CM | POA: Diagnosis not present

## 2020-03-04 DIAGNOSIS — M9903 Segmental and somatic dysfunction of lumbar region: Secondary | ICD-10-CM | POA: Diagnosis not present

## 2020-03-04 DIAGNOSIS — M5136 Other intervertebral disc degeneration, lumbar region: Secondary | ICD-10-CM | POA: Diagnosis not present

## 2020-03-04 DIAGNOSIS — M47816 Spondylosis without myelopathy or radiculopathy, lumbar region: Secondary | ICD-10-CM | POA: Diagnosis not present

## 2020-03-05 DIAGNOSIS — Z13220 Encounter for screening for lipoid disorders: Secondary | ICD-10-CM | POA: Diagnosis not present

## 2020-03-05 DIAGNOSIS — K2100 Gastro-esophageal reflux disease with esophagitis, without bleeding: Secondary | ICD-10-CM | POA: Diagnosis not present

## 2020-03-05 DIAGNOSIS — E559 Vitamin D deficiency, unspecified: Secondary | ICD-10-CM | POA: Diagnosis not present

## 2020-03-05 DIAGNOSIS — N183 Chronic kidney disease, stage 3 unspecified: Secondary | ICD-10-CM | POA: Diagnosis not present

## 2020-03-05 DIAGNOSIS — E7800 Pure hypercholesterolemia, unspecified: Secondary | ICD-10-CM | POA: Diagnosis not present

## 2020-03-07 DIAGNOSIS — M5136 Other intervertebral disc degeneration, lumbar region: Secondary | ICD-10-CM | POA: Diagnosis not present

## 2020-03-07 DIAGNOSIS — M9903 Segmental and somatic dysfunction of lumbar region: Secondary | ICD-10-CM | POA: Diagnosis not present

## 2020-03-07 DIAGNOSIS — M47816 Spondylosis without myelopathy or radiculopathy, lumbar region: Secondary | ICD-10-CM | POA: Diagnosis not present

## 2020-03-11 DIAGNOSIS — M5136 Other intervertebral disc degeneration, lumbar region: Secondary | ICD-10-CM | POA: Diagnosis not present

## 2020-03-11 DIAGNOSIS — M9903 Segmental and somatic dysfunction of lumbar region: Secondary | ICD-10-CM | POA: Diagnosis not present

## 2020-03-11 DIAGNOSIS — M47816 Spondylosis without myelopathy or radiculopathy, lumbar region: Secondary | ICD-10-CM | POA: Diagnosis not present

## 2020-03-12 DIAGNOSIS — R7301 Impaired fasting glucose: Secondary | ICD-10-CM | POA: Diagnosis not present

## 2020-03-12 DIAGNOSIS — Z1212 Encounter for screening for malignant neoplasm of rectum: Secondary | ICD-10-CM | POA: Diagnosis not present

## 2020-03-12 DIAGNOSIS — D649 Anemia, unspecified: Secondary | ICD-10-CM | POA: Diagnosis not present

## 2020-03-12 DIAGNOSIS — K7581 Nonalcoholic steatohepatitis (NASH): Secondary | ICD-10-CM | POA: Diagnosis not present

## 2020-03-12 DIAGNOSIS — N644 Mastodynia: Secondary | ICD-10-CM | POA: Diagnosis not present

## 2020-03-14 DIAGNOSIS — M5136 Other intervertebral disc degeneration, lumbar region: Secondary | ICD-10-CM | POA: Diagnosis not present

## 2020-03-14 DIAGNOSIS — M9903 Segmental and somatic dysfunction of lumbar region: Secondary | ICD-10-CM | POA: Diagnosis not present

## 2020-03-14 DIAGNOSIS — M47816 Spondylosis without myelopathy or radiculopathy, lumbar region: Secondary | ICD-10-CM | POA: Diagnosis not present

## 2020-03-18 DIAGNOSIS — M47816 Spondylosis without myelopathy or radiculopathy, lumbar region: Secondary | ICD-10-CM | POA: Diagnosis not present

## 2020-03-18 DIAGNOSIS — M9903 Segmental and somatic dysfunction of lumbar region: Secondary | ICD-10-CM | POA: Diagnosis not present

## 2020-03-18 DIAGNOSIS — M5136 Other intervertebral disc degeneration, lumbar region: Secondary | ICD-10-CM | POA: Diagnosis not present

## 2020-03-19 LAB — IFOBT (OCCULT BLOOD): IFOBT: POSITIVE

## 2020-03-20 DIAGNOSIS — M5136 Other intervertebral disc degeneration, lumbar region: Secondary | ICD-10-CM | POA: Diagnosis not present

## 2020-03-20 DIAGNOSIS — M9903 Segmental and somatic dysfunction of lumbar region: Secondary | ICD-10-CM | POA: Diagnosis not present

## 2020-03-20 DIAGNOSIS — M47816 Spondylosis without myelopathy or radiculopathy, lumbar region: Secondary | ICD-10-CM | POA: Diagnosis not present

## 2020-03-25 DIAGNOSIS — M9903 Segmental and somatic dysfunction of lumbar region: Secondary | ICD-10-CM | POA: Diagnosis not present

## 2020-03-25 DIAGNOSIS — M47816 Spondylosis without myelopathy or radiculopathy, lumbar region: Secondary | ICD-10-CM | POA: Diagnosis not present

## 2020-03-25 DIAGNOSIS — M5136 Other intervertebral disc degeneration, lumbar region: Secondary | ICD-10-CM | POA: Diagnosis not present

## 2020-03-27 DIAGNOSIS — M9903 Segmental and somatic dysfunction of lumbar region: Secondary | ICD-10-CM | POA: Diagnosis not present

## 2020-03-27 DIAGNOSIS — M5136 Other intervertebral disc degeneration, lumbar region: Secondary | ICD-10-CM | POA: Diagnosis not present

## 2020-03-27 DIAGNOSIS — M47816 Spondylosis without myelopathy or radiculopathy, lumbar region: Secondary | ICD-10-CM | POA: Diagnosis not present

## 2020-04-02 DIAGNOSIS — M5136 Other intervertebral disc degeneration, lumbar region: Secondary | ICD-10-CM | POA: Diagnosis not present

## 2020-04-02 DIAGNOSIS — M9903 Segmental and somatic dysfunction of lumbar region: Secondary | ICD-10-CM | POA: Diagnosis not present

## 2020-04-02 DIAGNOSIS — M47816 Spondylosis without myelopathy or radiculopathy, lumbar region: Secondary | ICD-10-CM | POA: Diagnosis not present

## 2020-04-08 DIAGNOSIS — M1612 Unilateral primary osteoarthritis, left hip: Secondary | ICD-10-CM | POA: Diagnosis not present

## 2020-04-12 DIAGNOSIS — M1612 Unilateral primary osteoarthritis, left hip: Secondary | ICD-10-CM | POA: Diagnosis not present

## 2020-04-25 DIAGNOSIS — M1612 Unilateral primary osteoarthritis, left hip: Secondary | ICD-10-CM | POA: Diagnosis not present

## 2020-05-02 ENCOUNTER — Encounter (INDEPENDENT_AMBULATORY_CARE_PROVIDER_SITE_OTHER): Payer: Self-pay | Admitting: Gastroenterology

## 2020-05-02 ENCOUNTER — Ambulatory Visit (INDEPENDENT_AMBULATORY_CARE_PROVIDER_SITE_OTHER): Payer: Medicare Other | Admitting: Gastroenterology

## 2020-05-02 ENCOUNTER — Encounter (INDEPENDENT_AMBULATORY_CARE_PROVIDER_SITE_OTHER): Payer: Self-pay | Admitting: *Deleted

## 2020-05-02 ENCOUNTER — Telehealth (INDEPENDENT_AMBULATORY_CARE_PROVIDER_SITE_OTHER): Payer: Self-pay | Admitting: *Deleted

## 2020-05-02 ENCOUNTER — Other Ambulatory Visit: Payer: Self-pay

## 2020-05-02 VITALS — BP 138/71 | HR 68 | Temp 98.9°F | Ht 76.0 in | Wt 271.8 lb

## 2020-05-02 DIAGNOSIS — Z7901 Long term (current) use of anticoagulants: Secondary | ICD-10-CM

## 2020-05-02 DIAGNOSIS — R195 Other fecal abnormalities: Secondary | ICD-10-CM

## 2020-05-02 NOTE — Telephone Encounter (Signed)
Per Dr Candis Musa patient can stop Brilinta 1 day prior to procedures but do not stop ASA - patient aware, new instructions mailed to patient

## 2020-05-02 NOTE — Telephone Encounter (Signed)
Patient needs Plenvu (copay card) ° °

## 2020-05-02 NOTE — Patient Instructions (Addendum)
We are scheduling endoscopy and colonoscopy for evaluation  R19.5-coding for procedures

## 2020-05-02 NOTE — Progress Notes (Signed)
Patient profile: Bruce Stewart is a 74 y.o. male seen for evaluation of hemosure at PCP.   History of Present Illness: Bruce Stewart is seen today for evaluation of positive Hemoccult.  He denies seeing any blood in his stools or dark stools.  Hemoccult test done routinely by his PCP.  He denies any abdominal pain or bowel issues.  He has chronic GERD and is on pantoprazole 40 mg once a day.  No significant breakthrough burning.  He does describe some occasional discomfort in his lower chest when he is eating food such as hamburger, this resolves with drinking water.  History seems consistent with dysphagia but he denies feeling like he has swallowing troubles.  He denies any nausea vomiting.  Has had some weight loss over the past years as below.  He reports his wife is also loosing weight as they are generally only eating 2 meals a day.  He denies any postprandial abdominal pain.  Non-smoker, no alcohol, denies any NSAIDs besides Mobic.  Wt Readings from Last 3 Encounters:  05/02/20 271 lb 12.8 oz (123.3 kg)  09/16/18 295 lb (133.8 kg)  03/16/18 (!) 301 lb 3.2 oz (136.6 kg)     Last Colonoscopy: 2019--2 small ulcers terminal ileum, petechiae ascending colon, diverticulosis sigmoid.    PATH-- Criss Rosales appearing ileal ulcer without changes of Crohn's disease. Patient advised not to take NSAIDs. He can use Imodium on as-needed basis. Keep symptom diary as to frequency and consistency of stools and abdominal pain for the next 4 weeks and call with progress report. No indication for mesalamine or other therapies at this time. If he does not improve symptomatically would consider small bowel given capsule study.      Past Medical History:  Past Medical History:  Diagnosis Date  . Anal fissure   . Diverticulitis   . Family hx of colon cancer   . Kidney stone     Problem List: Patient Active Problem List   Diagnosis Date Noted  . Abnormal CT of the abdomen 01/12/2018     Past Surgical History: Past Surgical History:  Procedure Laterality Date  . back surgery x 2    . BIOPSY  02/11/2018   Procedure: BIOPSY;  Surgeon: Rogene Houston, MD;  Location: [facility];  Service: Endoscopy;;  ileal  . CIRCUMCISION    . COLONOSCOPY N/A 09/12/2015   Procedure: COLONOSCOPY;  Surgeon: Rogene Houston, MD;  Location: [facility];  Service: Endoscopy;  Laterality: N/A;  2:25-moved up to [place] notified pt  . COLONOSCOPY N/A 02/11/2018   Procedure: COLONOSCOPY;  Surgeon: Rogene Houston, MD;  Location: [facility];  Service: Endoscopy;  Laterality: N/A;  9:15-moved to [address] to notify pt  . repair of anal fissure     spincterotomy    Allergies: Allergies  Allergen Reactions  . Codeine Itching  . Orange Fruit [Citrus] Itching  . Bee Venom Rash      Home Medications:  Current Outpatient Medications:  .  acetaminophen (TYLENOL) 500 MG tablet, Take 1,000 mg by mouth every 8 (eight) hours as needed for mild pain or moderate pain., Disp: , Rfl:  .  aspirin EC 81 MG tablet, Take 81 mg by mouth daily. Swallow whole., Disp: , Rfl:  .  buprenorphine (BUTRANS) 15 MCG/HR, Place 1 patch onto the skin once a week. , Disp: , Rfl:  .  Calcium-Magnesium-Zinc (CAL-MAG-ZINC PO), Take 1 tablet by mouth at bedtime., Disp: , Rfl:  .  carvedilol (COREG) 6.25 MG tablet, carvedilol 6.25 mg tablet, Disp: , Rfl:  .  Cholecalciferol 5000 units TABS, Take 5,000 Units by mouth daily. , Disp: , Rfl:  .  cyanocobalamin 1000 MCG tablet, Take 1,000 mcg by mouth daily., Disp: , Rfl:  .  Diclofenac Sodium 3 % CREA, Apply topically in the morning., Disp: , Rfl:  .  Fish Oil-Cholecalciferol (FISH OIL + D3 PO), Take 1,400 mg by mouth daily. , Disp: , Rfl:  .  gabapentin (NEURONTIN) 300 MG capsule, Take 600 mg by mouth 3 (three) times daily. , Disp: , Rfl:  .  meloxicam (MOBIC) 15 MG tablet, Take 15 mg by mouth daily., Disp: , Rfl:  .  Moringa Oleifera (MORINGA PO), Take by mouth as needed.,  Disp: , Rfl:  .  Multiple Vitamin (MULTIVITAMIN WITH MINERALS) TABS tablet, Take 1 tablet by mouth daily., Disp: , Rfl:  .  pantoprazole (PROTONIX) 40 MG tablet, Take 1 tablet (40 mg total) by mouth daily., Disp: 90 tablet, Rfl: 4 .  rosuvastatin (CRESTOR) 20 MG tablet, Take 20 mg by mouth daily., Disp: , Rfl:  .  tamsulosin (FLOMAX) 0.4 MG CAPS capsule, Take 0.4 mg by mouth daily. , Disp: , Rfl:  .  ticagrelor (BRILINTA) 90 MG TABS tablet, Take by mouth 2 (two) times daily., Disp: , Rfl:  .  tiZANidine (ZANAFLEX) 4 MG capsule, Take 4 mg by mouth 3 (three) times daily., Disp: , Rfl:  .  PEG-KCl-NaCl-NaSulf-Na Asc-C (PLENVU) 140 g SOLR, Take 1 kit by mouth once for 1 dose., Disp: 1 each, Rfl: 0   Family History: family history includes Colon cancer in his father; Diabetes in his mother.  Father passed from colon cancer in 46s.  Social History:   reports that he has never smoked. He has never used smokeless tobacco. He reports that he does not drink alcohol and does not use drugs.   Review of Systems: Constitutional: Denies weight loss/weight gain  Eyes: No changes in vision. ENT: No oral lesions, sore throat.  GI: see HPI.  Heme/Lymph: No easy bruising.  CV: No chest pain.  GU: No hematuria.  Integumentary: No rashes.  Neuro: No headaches.  Psych: No depression/anxiety.  Endocrine: No heat/cold intolerance.  Allergic/Immunologic: No urticaria.  Resp: No cough, SOB.  Musculoskeletal: No joint swelling.    Physical Examination: BP 138/71 (BP Location: Right Arm, Patient Position: Sitting, Cuff Size: Normal)   Pulse 68   Temp 98.9 F (37.2 C) (Oral)   Ht _0  (1.93 m)   Wt 271 lb 12.8 oz (123.3 kg)   BMI 33.08 kg/m  Gen: NAD, alert and oriented x 4 HEENT: PEERLA, EOMI, Neck: supple, no JVD Chest: CTA bilaterally, no wheezes, crackles, or other adventitious sounds CV: RRR, no m/g/c/r Abd: soft, NT, ND, +BS in all four quadrants; no HSM, guarding, ridigity, or rebound  tenderness Ext: no edema, well perfused with 2+ pulses, Skin: no rash or lesions noted on observed skin Lymph: no noted LAD  Data Reviewed:   Per patient was told after PCP labs that he was anemic-have requested these today.  May 2021-positive Hemoccult   Assessment/Plan: Mr. Pienta is a 74 y.o. male seen today for evaluation of positive Hemoccult.  1.  Hemoccult positive-no obvious blood in stools.  No specific GI symptoms except some lower esophageal discomfort with eating.  He is on Protonix which he will continue.  He is on Brilinta so consider anal outlet bleeding exacerbated by anticoagulation therapy.  He reports PCP told him he was anemic-I have requested these records.  He is not on iron supplement.  Further recs pending review of labs   He has only been on Brilinta for 6 months after drug-eluting stent was placed in December 2020-have sent a clearance to his cardiologist regarding holding anticoag prior to procedures. He denies prior issues w/ sedation   Elazar was seen today for follow-up.  Diagnoses and all orders for this visit:  Positive occult stool blood test  Long term current use of anticoagulant therapy     Recommendations:   I personally performed the service, non-incident to. (WP)  Laurine Blazer, [facility]

## 2020-05-03 DIAGNOSIS — M25552 Pain in left hip: Secondary | ICD-10-CM | POA: Diagnosis not present

## 2020-05-03 MED ORDER — PLENVU 140 G PO SOLR
1.0000 | Freq: Once | ORAL | 0 refills | Status: AC
Start: 2020-05-03 — End: 2020-05-03

## 2020-05-10 DIAGNOSIS — M25552 Pain in left hip: Secondary | ICD-10-CM | POA: Diagnosis not present

## 2020-05-10 DIAGNOSIS — M1612 Unilateral primary osteoarthritis, left hip: Secondary | ICD-10-CM | POA: Diagnosis not present

## 2020-05-15 ENCOUNTER — Telehealth (INDEPENDENT_AMBULATORY_CARE_PROVIDER_SITE_OTHER): Payer: Self-pay | Admitting: Gastroenterology

## 2020-05-15 DIAGNOSIS — D649 Anemia, unspecified: Secondary | ICD-10-CM | POA: Diagnosis not present

## 2020-05-15 DIAGNOSIS — N183 Chronic kidney disease, stage 3 unspecified: Secondary | ICD-10-CM | POA: Diagnosis not present

## 2020-05-15 DIAGNOSIS — I4891 Unspecified atrial fibrillation: Secondary | ICD-10-CM | POA: Diagnosis not present

## 2020-05-15 DIAGNOSIS — R079 Chest pain, unspecified: Secondary | ICD-10-CM | POA: Diagnosis not present

## 2020-05-15 DIAGNOSIS — M545 Low back pain: Secondary | ICD-10-CM | POA: Diagnosis not present

## 2020-05-15 DIAGNOSIS — R7303 Prediabetes: Secondary | ICD-10-CM | POA: Diagnosis not present

## 2020-05-15 DIAGNOSIS — R7301 Impaired fasting glucose: Secondary | ICD-10-CM | POA: Diagnosis not present

## 2020-05-15 DIAGNOSIS — M1612 Unilateral primary osteoarthritis, left hip: Secondary | ICD-10-CM | POA: Diagnosis not present

## 2020-05-15 NOTE — Telephone Encounter (Signed)
Can we request patient's most recent labs from his PCP again? We requested at his OV but didn't received.  Per patient he was anemic and if his hemoglobin was low Dr. Laural Golden wanted to add on an upper endoscopy.

## 2020-05-23 ENCOUNTER — Other Ambulatory Visit (INDEPENDENT_AMBULATORY_CARE_PROVIDER_SITE_OTHER): Payer: Self-pay | Admitting: Gastroenterology

## 2020-05-23 DIAGNOSIS — K633 Ulcer of intestine: Secondary | ICD-10-CM

## 2020-05-23 MED ORDER — PANTOPRAZOLE SODIUM 40 MG PO TBEC: 40.0000 mg | DELAYED_RELEASE_TABLET | Freq: Every day | ORAL | 4 refills | Status: DC

## 2020-05-23 MED ORDER — PANTOPRAZOLE SODIUM 40 MG PO TBEC: 40.0000 mg | DELAYED_RELEASE_TABLET | Freq: Every day | ORAL | 3 refills | Status: DC

## 2020-05-23 NOTE — Progress Notes (Signed)
Refill request received and refilled.

## 2020-05-31 DIAGNOSIS — I2510 Atherosclerotic heart disease of native coronary artery without angina pectoris: Secondary | ICD-10-CM | POA: Diagnosis not present

## 2020-05-31 DIAGNOSIS — I517 Cardiomegaly: Secondary | ICD-10-CM | POA: Diagnosis not present

## 2020-05-31 DIAGNOSIS — I5189 Other ill-defined heart diseases: Secondary | ICD-10-CM | POA: Diagnosis not present

## 2020-05-31 DIAGNOSIS — I252 Old myocardial infarction: Secondary | ICD-10-CM | POA: Diagnosis not present

## 2020-05-31 DIAGNOSIS — I350 Nonrheumatic aortic (valve) stenosis: Secondary | ICD-10-CM | POA: Diagnosis not present

## 2020-06-04 DIAGNOSIS — I2510 Atherosclerotic heart disease of native coronary artery without angina pectoris: Secondary | ICD-10-CM | POA: Diagnosis not present

## 2020-06-04 DIAGNOSIS — I10 Essential (primary) hypertension: Secondary | ICD-10-CM | POA: Diagnosis not present

## 2020-06-04 DIAGNOSIS — Z01810 Encounter for preprocedural cardiovascular examination: Secondary | ICD-10-CM | POA: Diagnosis not present

## 2020-06-04 DIAGNOSIS — E785 Hyperlipidemia, unspecified: Secondary | ICD-10-CM | POA: Diagnosis not present

## 2020-06-05 ENCOUNTER — Other Ambulatory Visit (INDEPENDENT_AMBULATORY_CARE_PROVIDER_SITE_OTHER): Payer: Self-pay | Admitting: *Deleted

## 2020-06-07 DIAGNOSIS — I252 Old myocardial infarction: Secondary | ICD-10-CM | POA: Diagnosis not present

## 2020-06-07 DIAGNOSIS — N390 Urinary tract infection, site not specified: Secondary | ICD-10-CM | POA: Diagnosis not present

## 2020-06-07 DIAGNOSIS — R109 Unspecified abdominal pain: Secondary | ICD-10-CM | POA: Diagnosis not present

## 2020-06-07 DIAGNOSIS — K529 Noninfective gastroenteritis and colitis, unspecified: Secondary | ICD-10-CM | POA: Diagnosis not present

## 2020-06-07 DIAGNOSIS — Z981 Arthrodesis status: Secondary | ICD-10-CM | POA: Diagnosis not present

## 2020-06-07 DIAGNOSIS — N200 Calculus of kidney: Secondary | ICD-10-CM | POA: Diagnosis not present

## 2020-06-07 DIAGNOSIS — I10 Essential (primary) hypertension: Secondary | ICD-10-CM | POA: Diagnosis not present

## 2020-06-07 DIAGNOSIS — M5116 Intervertebral disc disorders with radiculopathy, lumbar region: Secondary | ICD-10-CM | POA: Diagnosis not present

## 2020-06-07 DIAGNOSIS — I700 Atherosclerosis of aorta: Secondary | ICD-10-CM | POA: Diagnosis not present

## 2020-06-07 DIAGNOSIS — K5730 Diverticulosis of large intestine without perforation or abscess without bleeding: Secondary | ICD-10-CM | POA: Diagnosis not present

## 2020-06-07 DIAGNOSIS — M1990 Unspecified osteoarthritis, unspecified site: Secondary | ICD-10-CM | POA: Diagnosis not present

## 2020-06-07 NOTE — Patient Instructions (Signed)
Your procedure is scheduled on: 06/12/2020  Report to [facility] at 7:00    AM.  Call this number if you have problems the morning of surgery: [phone]   Remember:              Follow Directions on the letter you received from Your Physician's office regarding the Bowel Prep              No Smoking the day of Procedure :   Take these medicines the morning of surgery with A SIP OF WATER: Carvedilol cymbalta, protonix, and zanaflex   Do not wear jewelry, make-up or nail polish.    Do not bring valuables to the hospital.  Contacts, dentures or bridgework may not be worn into surgery.  .   Patients discharged the day of surgery will not be allowed to drive home.     Colonoscopy, Adult, Care After This sheet gives you information about how to care for yourself after your procedure. Your health care provider may also give you more specific instructions. If you have problems or questions, contact your health care provider. What can I expect after the procedure? After the procedure, it is common to have:  A small amount of blood in your stool for 24 hours after the procedure.  Some gas.  Mild abdominal cramping or bloating.  Follow these instructions at home: General instructions   For the first 24 hours after the procedure: ? Do not drive or use machinery. ? Do not sign important documents. ? Do not drink alcohol. ? Do your regular daily activities at a slower pace than normal. ? Eat soft, easy-to-digest foods. ? Rest often.  Take over-the-counter or prescription medicines only as told by your health care provider.  It is up to you to get the results of your procedure. Ask your health care provider, or the department performing the procedure, when your results will be ready. Relieving cramping and bloating  Try walking around when you have cramps or feel bloated.  Apply heat to your abdomen as told by your health care provider. Use a heat source that your health care  provider recommends, such as a moist heat pack or a heating pad. ? Place a towel between your skin and the heat source. ? Leave the heat on for 20-30 minutes. ? Remove the heat if your skin turns bright red. This is especially important if you are unable to feel pain, heat, or cold. You may have a greater risk of getting burned. Eating and drinking  Drink enough fluid to keep your urine clear or pale yellow.  Resume your normal diet as instructed by your health care provider. Avoid heavy or fried foods that are hard to digest.  Avoid drinking alcohol for as long as instructed by your health care provider. Contact a health care provider if:  You have blood in your stool 2-3 days after the procedure. Get help right away if:  You have more than a small spotting of blood in your stool.  You pass large blood clots in your stool.  Your abdomen is swollen.  You have nausea or vomiting.  You have a fever.  You have increasing abdominal pain that is not relieved with medicine. This information is not intended to replace advice given to you by your health care provider. Make sure you discuss any questions you have with your health care provider. Document Released: 06/02/2004 Document Revised: 07/13/2016 Document Reviewed: 12/31/2015 Elsevier Interactive Patient Education  2018 [place].  Upper Endoscopy, Adult, Care After This sheet gives you information about how to care for yourself after your procedure. Your health care provider may also give you more specific instructions. If you have problems or questions, contact your health care provider. What can I expect after the procedure? After the procedure, it is common to have:  A sore throat.  Mild stomach pain or discomfort.  Bloating.  Nausea. Follow these instructions at home:   Follow instructions from your health care provider about what to eat or drink after your procedure.  Return to your normal activities as told by  your health care provider. Ask your health care provider what activities are safe for you.  Take over-the-counter and prescription medicines only as told by your health care provider.  Do not drive for 24 hours if you were given a sedative during your procedure.  Keep all follow-up visits as told by your health care provider. This is important. Contact a health care provider if you have:  A sore throat that lasts longer than one day.  Trouble swallowing. Get help right away if:  You vomit blood or your vomit looks like coffee grounds.  You have: ? A fever. ? Bloody, black, or tarry stools. ? A severe sore throat or you cannot swallow. ? Difficulty breathing. ? Severe pain in your chest or abdomen. Summary  After the procedure, it is common to have a sore throat, mild stomach discomfort, bloating, and nausea.  Do not drive for 24 hours if you were given a sedative during the procedure.  Follow instructions from your health care provider about what to eat or drink after your procedure.  Return to your normal activities as told by your health care provider. This information is not intended to replace advice given to you by your health care provider. Make sure you discuss any questions you have with your health care provider. Document Revised: 04/12/2018 Document Reviewed: 03/21/2018 Elsevier Patient Education  [place].

## 2020-06-10 ENCOUNTER — Other Ambulatory Visit: Payer: Self-pay

## 2020-06-10 ENCOUNTER — Encounter (HOSPITAL_COMMUNITY)
Admission: RE | Admit: 2020-06-10 | Discharge: 2020-06-10 | Disposition: A | Discharge status: Home / Self Care | Payer: Medicare Other | Source: Ambulatory Visit | Attending: Internal Medicine | Admitting: Internal Medicine

## 2020-06-10 ENCOUNTER — Encounter (HOSPITAL_COMMUNITY): Payer: Self-pay

## 2020-06-10 ENCOUNTER — Other Ambulatory Visit (HOSPITAL_COMMUNITY)
Admission: RE | Admit: 2020-06-10 | Discharge: 2020-06-10 | Disposition: A | Discharge status: Home / Self Care | Payer: Medicare Other | Source: Ambulatory Visit | Attending: Internal Medicine | Admitting: Internal Medicine

## 2020-06-10 DIAGNOSIS — Z01818 Encounter for other preprocedural examination: Secondary | ICD-10-CM | POA: Diagnosis not present

## 2020-06-10 DIAGNOSIS — Z20822 Contact with and (suspected) exposure to covid-19: Secondary | ICD-10-CM | POA: Diagnosis not present

## 2020-06-10 HISTORY — DX: Other specified postprocedural states: Z98.890

## 2020-06-10 HISTORY — DX: Nausea with vomiting, unspecified: R11.2

## 2020-06-10 HISTORY — DX: Other complications of anesthesia, initial encounter: T88.59XA

## 2020-06-10 LAB — CBC WITH DIFFERENTIAL/PLATELET
Abs Immature Granulocytes: 0.02 10*3/uL (ref 0.00–0.07)
Basophils Absolute: 0 10*3/uL (ref 0.0–0.1)
Basophils Relative: 0 %
Eosinophils Absolute: 0.3 10*3/uL (ref 0.0–0.5)
Eosinophils Relative: 4 %
HCT: 36.2 % — ABNORMAL LOW (ref 39.0–52.0)
Hemoglobin: 11.7 g/dL — ABNORMAL LOW (ref 13.0–17.0)
Immature Granulocytes: 0 %
Lymphocytes Relative: 28 %
Lymphs Abs: 1.9 10*3/uL (ref 0.7–4.0)
MCH: 31.2 pg (ref 26.0–34.0)
MCHC: 32.3 g/dL (ref 30.0–36.0)
MCV: 96.5 fL (ref 80.0–100.0)
Monocytes Absolute: 0.6 10*3/uL (ref 0.1–1.0)
Monocytes Relative: 9 %
Neutro Abs: 4 10*3/uL (ref 1.7–7.7)
Neutrophils Relative %: 59 %
Platelets: 223 10*3/uL (ref 150–400)
RBC: 3.75 MIL/uL — ABNORMAL LOW (ref 4.22–5.81)
RDW: 13.2 % (ref 11.5–15.5)
WBC: 6.7 10*3/uL (ref 4.0–10.5)
nRBC: 0 % (ref 0.0–0.2)

## 2020-06-10 LAB — SARS CORONAVIRUS 2 (TAT 6-24 HRS): SARS Coronavirus 2: NEGATIVE

## 2020-06-12 ENCOUNTER — Encounter (HOSPITAL_COMMUNITY): Payer: Self-pay | Admitting: Internal Medicine

## 2020-06-12 ENCOUNTER — Encounter (HOSPITAL_COMMUNITY)
Admission: RE | Disposition: A | Discharge status: Home / Self Care | Payer: Self-pay | Source: Home / Self Care | Attending: Internal Medicine

## 2020-06-12 ENCOUNTER — Other Ambulatory Visit (INDEPENDENT_AMBULATORY_CARE_PROVIDER_SITE_OTHER): Payer: Self-pay | Admitting: Internal Medicine

## 2020-06-12 ENCOUNTER — Ambulatory Visit (HOSPITAL_COMMUNITY): Payer: Medicare Other | Admitting: Anesthesiology

## 2020-06-12 ENCOUNTER — Ambulatory Visit (HOSPITAL_COMMUNITY)
Admission: RE | Admit: 2020-06-12 | Discharge: 2020-06-12 | Disposition: A | Discharge status: Home / Self Care | Payer: Medicare Other | Attending: Internal Medicine | Admitting: Internal Medicine

## 2020-06-12 DIAGNOSIS — Z79899 Other long term (current) drug therapy: Secondary | ICD-10-CM | POA: Diagnosis not present

## 2020-06-12 DIAGNOSIS — Z955 Presence of coronary angioplasty implant and graft: Secondary | ICD-10-CM | POA: Insufficient documentation

## 2020-06-12 DIAGNOSIS — Z791 Long term (current) use of non-steroidal anti-inflammatories (NSAID): Secondary | ICD-10-CM | POA: Diagnosis not present

## 2020-06-12 DIAGNOSIS — R195 Other fecal abnormalities: Secondary | ICD-10-CM | POA: Diagnosis not present

## 2020-06-12 DIAGNOSIS — K5730 Diverticulosis of large intestine without perforation or abscess without bleeding: Secondary | ICD-10-CM | POA: Insufficient documentation

## 2020-06-12 DIAGNOSIS — K644 Residual hemorrhoidal skin tags: Secondary | ICD-10-CM | POA: Diagnosis not present

## 2020-06-12 DIAGNOSIS — D500 Iron deficiency anemia secondary to blood loss (chronic): Secondary | ICD-10-CM | POA: Diagnosis not present

## 2020-06-12 DIAGNOSIS — K317 Polyp of stomach and duodenum: Secondary | ICD-10-CM | POA: Diagnosis not present

## 2020-06-12 DIAGNOSIS — I252 Old myocardial infarction: Secondary | ICD-10-CM | POA: Insufficient documentation

## 2020-06-12 DIAGNOSIS — N289 Disorder of kidney and ureter, unspecified: Secondary | ICD-10-CM | POA: Diagnosis not present

## 2020-06-12 DIAGNOSIS — Z885 Allergy status to narcotic agent status: Secondary | ICD-10-CM | POA: Diagnosis not present

## 2020-06-12 DIAGNOSIS — Z7982 Long term (current) use of aspirin: Secondary | ICD-10-CM | POA: Diagnosis not present

## 2020-06-12 DIAGNOSIS — Z800 Family history of malignant neoplasm of digestive organs: Secondary | ICD-10-CM | POA: Insufficient documentation

## 2020-06-12 DIAGNOSIS — K2970 Gastritis, unspecified, without bleeding: Secondary | ICD-10-CM | POA: Diagnosis not present

## 2020-06-12 DIAGNOSIS — K649 Unspecified hemorrhoids: Secondary | ICD-10-CM | POA: Diagnosis not present

## 2020-06-12 DIAGNOSIS — K633 Ulcer of intestine: Secondary | ICD-10-CM | POA: Diagnosis not present

## 2020-06-12 DIAGNOSIS — K3189 Other diseases of stomach and duodenum: Secondary | ICD-10-CM | POA: Diagnosis not present

## 2020-06-12 DIAGNOSIS — K5180 Other ulcerative colitis without complications: Secondary | ICD-10-CM | POA: Diagnosis not present

## 2020-06-12 HISTORY — PX: ESOPHAGOGASTRODUODENOSCOPY (EGD) WITH PROPOFOL: SHX5813

## 2020-06-12 HISTORY — PX: POLYPECTOMY: SHX5525

## 2020-06-12 HISTORY — PX: COLONOSCOPY WITH PROPOFOL: SHX5780

## 2020-06-12 HISTORY — PX: BIOPSY: SHX5522

## 2020-06-12 SURGERY — COLONOSCOPY WITH PROPOFOL
Anesthesia: General

## 2020-06-12 MED ORDER — LIDOCAINE VISCOUS HCL 2 % MT SOLN
OROMUCOSAL | Status: AC
  Filled 2020-06-12: qty 15

## 2020-06-12 MED ORDER — PROPOFOL 500 MG/50ML IV EMUL
INTRAVENOUS | Status: DC | PRN
  Administered 2020-06-12: 75 ug/kg/min via INTRAVENOUS
  Administered 2020-06-12: 50 ug/kg/min via INTRAVENOUS

## 2020-06-12 MED ORDER — PROPOFOL 10 MG/ML IV BOLUS
INTRAVENOUS | Status: DC | PRN
  Administered 2020-06-12: 80 mg via INTRAVENOUS
  Administered 2020-06-12 (×2): 40 mg via INTRAVENOUS

## 2020-06-12 MED ORDER — GLYCOPYRROLATE 0.2 MG/ML IJ SOLN
0.2000 mg | Freq: Once | INTRAMUSCULAR | Status: AC
  Administered 2020-06-12: 0.2 mg via INTRAVENOUS

## 2020-06-12 MED ORDER — GLYCOPYRROLATE 0.2 MG/ML IJ SOLN
INTRAMUSCULAR | Status: AC
  Filled 2020-06-12: qty 1

## 2020-06-12 MED ORDER — PROMETHAZINE HCL 25 MG RE SUPP: 25.0000 mg | Freq: Two times a day (BID) | RECTAL | 0 refills | Status: DC | PRN

## 2020-06-12 MED ORDER — STERILE WATER FOR IRRIGATION IR SOLN
Status: DC | PRN
  Administered 2020-06-12: 2.5 mL

## 2020-06-12 MED ORDER — LACTATED RINGERS IV SOLN
INTRAVENOUS | Status: DC
  Administered 2020-06-12: 1000 mL via INTRAVENOUS

## 2020-06-12 MED ORDER — LIDOCAINE VISCOUS HCL 2 % MT SOLN
15.0000 mL | Freq: Once | OROMUCOSAL | Status: AC
  Administered 2020-06-12: 15 mL via OROMUCOSAL

## 2020-06-12 NOTE — Op Note (Signed)
[facility] Patient Name: Bruce Stewart Procedure Date: 06/12/2020 8:15 AM MRN: [id number] Date of Birth: Oct 11, 1946 Attending MD: Hildred Laser , MD CSN: [id number] Age: 74 Admit Type: Outpatient Procedure:                Upper GI endoscopy Indications:              Iron deficiency anemia secondary to chronic blood                            loss Providers:                Hildred Laser, MD, Lurline Del, RN, Aram Candela Referring MD:             Manon Hilding, MD Medicines:                Propofol per Anesthesia Complications:            No immediate complications. Estimated Blood Loss:     Estimated blood loss was minimal. Procedure:                Pre-Anesthesia Assessment:                           - Prior to the procedure, a History and Physical                            was performed, and patient medications and                            allergies were reviewed. The patient's tolerance of                            previous anesthesia was also reviewed. The risks                            and benefits of the procedure and the sedation                            options and risks were discussed with the patient.                            All questions were answered, and informed consent                            was obtained. Prior Anticoagulants: The patient                            last took antiplatelet medication 1 day prior to                            the procedure. ASA Grade Assessment: II - A patient                            with mild systemic disease. After reviewing the  risks and benefits, the patient was deemed in                            satisfactory condition to undergo the procedure.                           After obtaining informed consent, the endoscope was                            passed under direct vision. Throughout the                            procedure, the patient's blood pressure, pulse, and                             oxygen saturations were monitored continuously. The                            [id number]-H190 ([id number]) scope was introduced through the                            mouth, and advanced to the second part of duodenum.                            The upper GI endoscopy was accomplished without                            difficulty. The patient tolerated the procedure                            well. Scope In: 8:41:20 AM Scope Out: 8:51:48 AM Total Procedure Duration: 0 hours 10 minutes 28 seconds  Findings:      The hypopharynx was normal.      The examined esophagus was normal.      The Z-line was regular and was found 40 cm from the incisors.      Patchy mild inflammation characterized by congestion (edema), erythema       and granularity was found in the gastric antrum and in the prepyloric       region of the stomach. Biopsies were taken with a cold forceps for       histology. The pathology specimen was placed into Bottle Number 1.      Two small sessile polyps with no bleeding and no stigmata of recent       bleeding were found in the gastric fundus. Biopsies were taken with a       cold forceps for histology. The pathology specimen was placed into       Bottle Number 2.      The duodenal bulb and second portion of the duodenum were normal. Impression:               - Normal hypopharynx.                           - Normal esophagus.                           -  Z-line regular, 40 cm from the incisors.                           - Gastritis. Biopsied.                           - Two gastric polyps. Biopsied.                           - Normal duodenal bulb and second portion of the                            duodenum. Moderate Sedation:      Per Anesthesia Care Recommendation:           - Patient has a contact number available for                            emergencies. The signs and symptoms of potential                            delayed complications were discussed with the                             patient. Return to normal activities tomorrow.                            Written discharge instructions were provided to the                            patient.                           - Await pathology results.                           - See the other procedure note for documentation of                            additional recommendations. Procedure Code(s):        --- Professional ---                           [phone], Esophagogastroduodenoscopy, flexible,                            transoral; with biopsy, single or multiple Diagnosis Code(s):        --- Professional ---                           K29.70, Gastritis, unspecified, without bleeding                           K31.7, Polyp of stomach and duodenum                           D50.0, Iron deficiency anemia secondary to blood  loss (chronic) CPT copyright 2019 American Medical Association. All rights reserved. The codes documented in this report are preliminary and upon coder review may  be revised to meet current compliance requirements. Hildred Laser, MD Hildred Laser, MD 06/12/2020 9:24:49 AM This report has been signed electronically. Number of Addenda: 0

## 2020-06-12 NOTE — Discharge Instructions (Signed)
Please do not take meloxicam.  Please discuss pain management with Dr. Quintin Alto. Resume aspirin today and Brilinta tomorrow morning. Resume other medications as before. High-fiber diet. No driving for 24 hours. Physician will call with biopsy results.    Monitored Anesthesia Care, Care After These instructions provide you with information about caring for yourself after your procedure. Your health care provider may also give you more specific instructions. Your treatment has been planned according to current medical practices, but problems sometimes occur. Call your health care provider if you have any problems or questions after your procedure. What can I expect after the procedure? After your procedure, you may:  Feel sleepy for several hours.  Feel clumsy and have poor balance for several hours.  Feel forgetful about what happened after the procedure.  Have poor judgment for several hours.  Feel nauseous or vomit.  Have a sore throat if you had a breathing tube during the procedure. Follow these instructions at home: For at least 24 hours after the procedure:      Have a responsible adult stay with you. It is important to have someone help care for you until you are awake and alert.  Rest as needed.  Do not: ? Participate in activities in which you could fall or become injured. ? Drive. ? Use heavy machinery. ? Drink alcohol. ? Take sleeping pills or medicines that cause drowsiness. ? Make important decisions or sign legal documents. ? Take care of children on your own. Eating and drinking  Follow the diet that is recommended by your health care provider.  If you vomit, drink water, juice, or soup when you can drink without vomiting.  Make sure you have little or no nausea before eating solid foods. General instructions  Take over-the-counter and prescription medicines only as told by your health care provider.  If you have sleep apnea, surgery and certain  medicines can increase your risk for breathing problems. Follow instructions from your health care provider about wearing your sleep device: ? Anytime you are sleeping, including during daytime naps. ? While taking prescription pain medicines, sleeping medicines, or medicines that make you drowsy.  If you smoke, do not smoke without supervision.  Keep all follow-up visits as told by your health care provider. This is important. Contact a health care provider if:  You keep feeling nauseous or you keep vomiting.  You feel light-headed.  You develop a rash.  You have a fever. Get help right away if:  You have trouble breathing. Summary  For several hours after your procedure, you may feel sleepy and have poor judgment.  Have a responsible adult stay with you for at least 24 hours or until you are awake and alert. This information is not intended to replace advice given to you by your health care provider. Make sure you discuss any questions you have with your health care provider. Document Revised: 01/17/2018 Document Reviewed: 02/09/2016 Elsevier Patient Education  [place].    High-Fiber Diet Fiber, also called dietary fiber, is a type of carbohydrate that is found in fruits, vegetables, whole grains, and beans. A high-fiber diet can have many health benefits. Your health care provider may recommend a high-fiber diet to help:  Prevent constipation. Fiber can make your bowel movements more regular.  Lower your cholesterol.  Relieve the following conditions: ? Swelling of veins in the anus (hemorrhoids). ? Swelling and irritation (inflammation) of specific areas of the digestive tract (uncomplicated diverticulosis). ? A problem of the  large intestine (colon) that sometimes causes pain and diarrhea (irritable bowel syndrome, IBS).  Prevent overeating as part of a weight-loss plan.  Prevent heart disease, type 2 diabetes, and certain cancers. What is my plan? The  recommended daily fiber intake in grams (g) includes:  38 g for men age 6 or younger.  30 g for men over age 27.  8 g for women age 54 or younger.  21 g for women over age 23. You can get the recommended daily intake of dietary fiber by:  Eating a variety of fruits, vegetables, grains, and beans.  Taking a fiber supplement, if it is not possible to get enough fiber through your diet. What do I need to know about a high-fiber diet?  It is better to get fiber through food sources rather than from fiber supplements. There is not a lot of research about how effective supplements are.  Always check the fiber content on the nutrition facts label of any prepackaged food. Look for foods that contain 5 g of fiber or more per serving.  Talk with a diet and nutrition specialist (dietitian) if you have questions about specific foods that are recommended or not recommended for your medical condition, especially if those foods are not listed below.  Gradually increase how much fiber you consume. If you increase your intake of dietary fiber too quickly, you may have bloating, cramping, or gas.  Drink plenty of water. Water helps you to digest fiber. What are tips for following this plan?  Eat a wide variety of high-fiber foods.  Make sure that half of the grains that you eat each day are whole grains.  Eat breads and cereals that are made with whole-grain flour instead of refined flour or white flour.  Eat brown rice, bulgur wheat, or millet instead of white rice.  Start the day with a breakfast that is high in fiber, such as a cereal that contains 5 g of fiber or more per serving.  Use beans in place of meat in soups, salads, and pasta dishes.  Eat high-fiber snacks, such as berries, raw vegetables, nuts, and popcorn.  Choose whole fruits and vegetables instead of processed forms like juice or sauce. What foods can I eat?  Fruits Berries. Pears. Apples. Oranges. Avocado. Prunes and  raisins. Dried figs. Vegetables Sweet potatoes. Spinach. Kale. Artichokes. Cabbage. Broccoli. Cauliflower. Green peas. Carrots. Squash. Grains Whole-grain breads. Multigrain cereal. Oats and oatmeal. Brown rice. Barley. Bulgur wheat. [place]. Quinoa. Bran muffins. Popcorn. Rye wafer crackers. Meats and other proteins Navy, kidney, and pinto beans. Soybeans. Split peas. Lentils. Nuts and seeds. Dairy Fiber-fortified yogurt. Beverages Fiber-fortified soy milk. Fiber-fortified orange juice. Other foods Fiber bars. The items listed above may not be a complete list of recommended foods and beverages. Contact a dietitian for more options. What foods are not recommended? Fruits Fruit juice. Cooked, strained fruit. Vegetables Fried potatoes. Canned vegetables. Well-cooked vegetables. Grains White bread. Pasta made with refined flour. White rice. Meats and other proteins Fatty cuts of meat. Fried chicken or fried fish. Dairy Milk. Yogurt. Cream cheese. Sour cream. Fats and oils Butters. Beverages Soft drinks. Other foods Cakes and pastries. The items listed above may not be a complete list of foods and beverages to avoid. Contact a dietitian for more information. Summary  Fiber is a type of carbohydrate. It is found in fruits, vegetables, whole grains, and beans.  There are many health benefits of eating a high-fiber diet, such as preventing constipation, lowering blood cholesterol,  helping with weight loss, and reducing your risk of heart disease, diabetes, and certain cancers.  Gradually increase your intake of fiber. Increasing too fast can result in cramping, bloating, and gas. Drink plenty of water while you increase your fiber.  The best sources of fiber include whole fruits and vegetables, whole grains, nuts, seeds, and beans. This information is not intended to replace advice given to you by your health care provider. Make sure you discuss any questions you have with your  health care provider. Document Revised: 08/23/2017 Document Reviewed: 08/23/2017 Elsevier Patient Education  [place].    Colonoscopy, Adult, Care After This sheet gives you information about how to care for yourself after your procedure. Your doctor may also give you more specific instructions. If you have problems or questions, call your doctor. What can I expect after the procedure? After the procedure, it is common to have:  A small amount of blood in your poop (stool) for 24 hours.  Some gas.  Mild cramping or bloating in your belly (abdomen). Follow these instructions at home: Eating and drinking   Drink enough fluid to keep your pee (urine) pale yellow.  Follow instructions from your doctor about what you cannot eat or drink.  Return to your normal diet as told by your doctor. Avoid heavy or fried foods that are hard to digest. Activity  Rest as told by your doctor.  Do not sit for a long time without moving. Get up to take short walks every 1-2 hours. This is important. Ask for help if you feel weak or unsteady.  Return to your normal activities as told by your doctor. Ask your doctor what activities are safe for you. To help cramping and bloating:   Try walking around.  Put heat on your belly as told by your doctor. Use the heat source that your doctor recommends, such as a moist heat pack or a heating pad. ? Put a towel between your skin and the heat source. ? Leave the heat on for 20-30 minutes. ? Remove the heat if your skin turns bright red. This is very important if you are unable to feel pain, heat, or cold. You may have a greater risk of getting burned. General instructions  For the first 24 hours after the procedure: ? Do not drive or use machinery. ? Do not sign important documents. ? Do not drink alcohol. ? Do your daily activities more slowly than normal. ? Eat foods that are soft and easy to digest.  Take over-the-counter or  prescription medicines only as told by your doctor.  Keep all follow-up visits as told by your doctor. This is important. Contact a doctor if:  You have blood in your poop 2-3 days after the procedure. Get help right away if:  You have more than a small amount of blood in your poop.  You see large clumps of tissue (blood clots) in your poop.  Your belly is swollen.  You feel like you may vomit (nauseous).  You vomit.  You have a fever.  You have belly pain that gets worse, and medicine does not help your pain. Summary  After the procedure, it is common to have a small amount of blood in your poop. You may also have mild cramping and bloating in your belly.  For the first 24 hours after the procedure, do not drive or use machinery, do not sign important documents, and do not drink alcohol.  Get help right away if  you have a lot of blood in your poop, feel like you may vomit, have a fever, or have more belly pain. This information is not intended to replace advice given to you by your health care provider. Make sure you discuss any questions you have with your health care provider. Document Revised: 05/15/2019 Document Reviewed: 05/15/2019 Elsevier Patient Education  [place].

## 2020-06-12 NOTE — Op Note (Signed)
[facility] Patient Name: Bruce Stewart Procedure Date: 06/12/2020 8:54 AM MRN: [id number] Date of Birth: 1946-10-16 Attending MD: Hildred Laser , MD CSN: [id number] Age: 74 Admit Type: Outpatient Procedure:                Colonoscopy Indications:              Iron deficiency anemia secondary to chronic blood                            loss Providers:                Hildred Laser, MD, Lurline Del, RN, Aram Candela Referring MD:             Manon Hilding, MD Medicines:                Propofol per Anesthesia Complications:            No immediate complications. Estimated Blood Loss:     Estimated blood loss was minimal. Procedure:                Pre-Anesthesia Assessment:                           - Prior to the procedure, a History and Physical                            was performed, and patient medications and                            allergies were reviewed. The patient's tolerance of                            previous anesthesia was also reviewed. The risks                            and benefits of the procedure and the sedation                            options and risks were discussed with the patient.                            All questions were answered, and informed consent                            was obtained. Prior Anticoagulants: The patient                            last took antiplatelet medication 1 day prior to                            the procedure. ASA Grade Assessment: II - A patient                            with mild systemic disease. After reviewing the  risks and benefits, the patient was deemed in                            satisfactory condition to undergo the procedure.                           After obtaining informed consent, the colonoscope                            was passed under direct vision. Throughout the                            procedure, the patient's blood pressure, pulse, and                             oxygen saturations were monitored continuously. The                            [id number]-[id number] ([id number]) scope was introduced through                            the anus and advanced to the the terminal ileum,                            with identification of the appendiceal orifice and                            IC valve. The colonoscopy was performed without                            difficulty. The patient tolerated the procedure                            well. The quality of the bowel preparation was                            excellent. The terminal ileum, ileocecal valve,                            appendiceal orifice, and rectum were photographed. Scope In: 8:57:11 AM Scope Out: 9:11:26 AM Scope Withdrawal Time: 0 hours 10 minutes 41 seconds  Total Procedure Duration: 0 hours 14 minutes 15 seconds  Findings:      The perianal and digital rectal examinations were normal.      The terminal ileum contained multiple ulcers. No bleeding was present.       No stigmata of recent bleeding were seen. Biopsies were taken with a       cold forceps for histology. The pathology specimen was placed into       Bottle Number 3.      Multiple small and large-mouthed diverticula were found in the sigmoid       colon.      External hemorrhoids were found during retroflexion. The hemorrhoids       were small. Impression:               -  Multiple ulcers in the terminal ileum. Biopsied.                           - Diverticulosis in the sigmoid colon.                           - External hemorrhoids. Moderate Sedation:      Per Anesthesia Care Recommendation:           - Patient has a contact number available for                            emergencies. The signs and symptoms of potential                            delayed complications were discussed with the                            patient. Return to normal activities tomorrow.                            Written discharge instructions were  provided to the                            patient.                           - High fiber diet today.                           - Continue present medications.                           - Resume Aspirin today and Brilinta tomorrow.                           - No aspirin, ibuprofen, naproxen, or other                            non-steroidal anti-inflammatory drugs.                           - Await pathology results.                           - Repeat colonoscopy in 5 years for screening                            purposes. Procedure Code(s):        --- Professional ---                           [phone], Colonoscopy, flexible; with biopsy, single                            or multiple Diagnosis Code(s):        --- Professional ---  K63.3, Ulcer of intestine                           K64.4, Residual hemorrhoidal skin tags                           D50.0, Iron deficiency anemia secondary to blood                            loss (chronic)                           K57.30, Diverticulosis of large intestine without                            perforation or abscess without bleeding CPT copyright 2019 American Medical Association. All rights reserved. The codes documented in this report are preliminary and upon coder review may  be revised to meet current compliance requirements. Hildred Laser, MD Hildred Laser, MD 06/12/2020 9:29:52 AM This report has been signed electronically. Number of Addenda: 0

## 2020-06-12 NOTE — Anesthesia Postprocedure Evaluation (Signed)
Anesthesia Post Note  Patient: Bruce Stewart  Procedure(s) Performed: COLONOSCOPY WITH PROPOFOL (N/A ) ESOPHAGOGASTRODUODENOSCOPY (EGD) WITH PROPOFOL (N/A ) BIOPSY POLYPECTOMY  Patient location during evaluation: PACU Anesthesia Type: General Level of consciousness: awake and alert and patient cooperative Pain management: satisfactory to patient Vital Signs Assessment: post-procedure vital signs reviewed and stable Respiratory status: spontaneous breathing Cardiovascular status: stable Postop Assessment: no apparent nausea or vomiting Anesthetic complications: no   No complications documented.   Last Vitals:  Vitals:   06/12/20 0730 06/12/20 0920  BP: (!) 157/83 110/64  Pulse:  95  Resp: (!) 25 19  Temp: 37 C 36.8 C  SpO2: 94% 98%    Last Pain:  Vitals:   06/12/20 0730  TempSrc: Oral  PainSc: 0-No pain                 Stavroula Rohde

## 2020-06-12 NOTE — Telephone Encounter (Signed)
Patient called office to let us know that he vomited after eating egg sandwich. He states he vomited yesterday to when he took his prep for colonoscopy. He underwent EGD with colonoscopy earlier today which was uneventful. He had cold biopsy from the upper GI tract and biopsy of ileal ulcers. He is not having fever or other symptoms. We will treated with Phenergan suppository 25 mg per rectum now and repeat later this evening if necessary.  If vomiting persists he will need to come to emergency room for IV fluids and antiemetic therapy. Scription sent to [facility] drug for 8 doses.

## 2020-06-12 NOTE — Anesthesia Procedure Notes (Signed)
Date/Time: 06/12/2020 8:34 AM Performed by: Vista Deck, CRNA Pre-anesthesia Checklist: Patient identified, Emergency Drugs available, Suction available, Timeout performed and Patient being monitored Patient Re-evaluated:Patient Re-evaluated prior to induction Oxygen Delivery Method: Non-rebreather mask

## 2020-06-12 NOTE — H&P (Signed)
Bruce Stewart is an 74 y.o. male.   Chief Complaint: Patient is here for esophagogastroduodenoscopy and colonoscopy. HPI: Patient is 74 year old Caucasian male who was recently found to have heme positive stool and anemia.  Patient has a history of ileal erosions discovered on colonoscopy of April 2019.  Biopsies are negative for inflammatory bowel disease.  Patient is on meloxicam for about a year.  He is also on Brilinta which is on hold.  He denies nausea vomiting epigastric pain.  He does complain of pain in right lower quadrant and sometimes on the left with defecation.  He denies constipation or diarrhea.  He states he has lost 25 pounds voluntarily since he had MI in December 2020.  He has not had any cardiac issues since then. History significant for colon carcinoma and his father who was 68 at the time of diagnosis and died 2 years later.  Past Medical History:  Diagnosis Date  . Anal fissure   . Complication of anesthesia   . Diverticulitis   . Family hx of colon cancer   . Kidney stone   . Myocardial infarction ([place]) 10/2019  . PONV (postoperative nausea and vomiting)     Past Surgical History:  Procedure Laterality Date  . back surgery x 6 Other]    . BIOPSY  02/11/2018   Procedure: BIOPSY;  Surgeon: Rogene Houston, MD;  [facility];  Service: Endoscopy;;  ileal  . CARDIAC CATHETERIZATION  10/2019  . CIRCUMCISION    . COLONOSCOPY N/A 09/12/2015   Procedure: COLONOSCOPY;  Surgeon: Rogene Houston, MD;  [facility];  Service: Endoscopy;  Laterality: N/A;  2:25-moved up to [place] notified pt  . COLONOSCOPY N/A 02/11/2018   Procedure: COLONOSCOPY;  Surgeon: Rogene Houston, MD;  [facility];  Service: Endoscopy;  Laterality: N/A;  9:15-moved to [address] to notify pt  . CORONARY ANGIOPLASTY WITH STENT PLACEMENT  10/2019  . repair of anal fissure     spincterotomy    Family History  Problem Relation Age of Onset  . Diabetes Mother    . Colon cancer Father    Social History:  reports that he has never smoked. He has never used smokeless tobacco. He reports that he does not drink alcohol and does not use drugs.  Allergies:  Allergies  Allergen Reactions  . Codeine Itching  . Orange Fruit [Citrus] Itching  . Bee Venom Rash    Medications Prior to Admission  Medication Sig Dispense Refill  . acetaminophen (TYLENOL) 500 MG tablet Take 1,000 mg by mouth every 8 (eight) hours as needed for mild pain or moderate pain.    Marland Kitchen aspirin EC 81 MG tablet Take 81 mg by mouth daily. Swallow whole.    . buprenorphine (BUTRANS) 15 MCG/HR Place 1 patch onto the skin once a week.     . Calcium-Magnesium-Zinc (CAL-MAG-ZINC PO) Take 1 tablet by mouth at bedtime.    . carvedilol (COREG) 6.25 MG tablet Take 6.25 mg by mouth 2 (two) times daily with a meal.     . Cholecalciferol 5000 units TABS Take 5,000 Units by mouth daily.     . cyanocobalamin 1000 MCG tablet Take 1,000 mcg by mouth daily.    . diclofenac Sodium (VOLTAREN) 1 % GEL Apply 1 application topically daily as needed (pain).     . DULoxetine (CYMBALTA) 30 MG capsule Take 30 mg by mouth daily.    . Fish Oil-Cholecalciferol (FISH OIL +  D3 PO) Take 1,400 mg by mouth daily.     Marland Kitchen gabapentin (NEURONTIN) 300 MG capsule Take 300 mg by mouth 2 (two) times daily.     . meloxicam (MOBIC) 15 MG tablet Take 15 mg by mouth daily.    Edyth Gunnels Oleifera (MORINGA PO) Take 1 capsule by mouth daily.     . Multiple Vitamin (MULTIVITAMIN WITH MINERALS) TABS tablet Take 1 tablet by mouth daily.    . pantoprazole (PROTONIX) 40 MG tablet Take 1 tablet (40 mg total) by mouth daily. (Patient taking differently: Take 40 mg by mouth 2 (two) times daily. ) 90 tablet 3  . Probiotic Product (PROBIOTIC DAILY) CAPS Take 1 capsule by mouth daily.    . rosuvastatin (CRESTOR) 20 MG tablet Take 20 mg by mouth daily.    . tamsulosin (FLOMAX) 0.4 MG CAPS capsule Take 0.4 mg by mouth in the morning and at bedtime.      Marland Kitchen tiZANidine (ZANAFLEX) 4 MG capsule Take 4 mg by mouth 3 (three) times daily.    . ticagrelor (BRILINTA) 90 MG TABS tablet Take 90 mg by mouth 2 (two) times daily.       Results for orders placed or performed during the hospital encounter of 06/10/20 (from the past 48 hour(s))  SARS CORONAVIRUS 2 (TAT 6-24 HRS) Nasopharyngeal Nasopharyngeal Swab     Status: None   Collection Time: 06/10/20 10:17 AM   Specimen: Nasopharyngeal Swab  Result Value Ref Range   SARS Coronavirus 2 NEGATIVE NEGATIVE    Comment: (NOTE) SARS-CoV-2 target nucleic acids are NOT DETECTED.  The SARS-CoV-2 RNA is generally detectable in upper and lower respiratory specimens during the acute phase of infection. Negative results do not preclude SARS-CoV-2 infection, do not rule out co-infections with other pathogens, and should not be used as the sole basis for treatment or other patient management decisions. Negative results must be combined with clinical observations, patient history, and epidemiological information. The expected result is Negative.  Fact Sheet for Patients: SugarRoll.be  Fact Sheet for Healthcare Providers: https://www.woods-mathews.com/  This test is not yet approved or cleared by the [place] FDA and  has been authorized for detection and/or diagnosis of SARS-CoV-2 by FDA under an Emergency Use Authorization (EUA). This EUA will remain  in effect (meaning this test can be used) for the duration of the COVID-19 declaration under Se ction 564(b)(1) of the Act, 21 U.S.C. section 360bbb-3(b)(1), unless the authorization is terminated or revoked sooner.  Performed at [facility], [place] [address], [place], [place] [address]   CBC with Differential/Platelet     Status: Abnormal   Collection Time: 06/10/20 10:40 AM  Result Value Ref Range   WBC 6.7 4.0 - 10.5 K/uL   RBC 3.75 (L) 4.22 - 5.81 MIL/uL   Hemoglobin 11.7 (L) 13.0 - 17.0 g/dL    HCT 36.2 (L) 39 - 52 %   MCV 96.5 80.0 - 100.0 fL   MCH 31.2 26.0 - 34.0 pg   MCHC 32.3 30.0 - 36.0 g/dL   RDW 13.2 11.5 - 15.5 %   Platelets 223 150 - 400 K/uL   nRBC 0.0 0.0 - 0.2 %   Neutrophils Relative % 59 %   Neutro Abs 4.0 1.7 - 7.7 K/uL   Lymphocytes Relative 28 %   Lymphs Abs 1.9 0.7 - 4.0 K/uL   Monocytes Relative 9 %   Monocytes Absolute 0.6 0 - 1 K/uL   Eosinophils Relative 4 %  Eosinophils Absolute 0.3 0 - 0 K/uL   Basophils Relative 0 %   Basophils Absolute 0.0 0 - 0 K/uL   Immature Granulocytes 0 %   Abs Immature Granulocytes 0.02 0.00 - 0.07 K/uL    Comment: Performed at [facility], [address], [place], [place] [address]   No results found.  Review of Systems  Blood pressure (!) 157/83, temperature 98.6 F (37 C), temperature source Oral, resp. rate (!) 25, SpO2 94 %. Physical Exam HENT:     Mouth/Throat:     Mouth: Mucous membranes are moist.     Pharynx: Oropharynx is clear.  Eyes:     General: No scleral icterus.    Conjunctiva/sclera: Conjunctivae normal.  Cardiovascular:     Rate and Rhythm: Normal rate and regular rhythm.     Heart sounds: Normal heart sounds. No murmur heard.   Pulmonary:     Effort: Pulmonary effort is normal.     Breath sounds: Normal breath sounds.  Abdominal:     Comments: Abdomen is obese.  Soft with mild tenderness in right lower quadrant as well as left lower quadrant.  No organomegaly or masses.  Musculoskeletal:        General: No swelling.     Cervical back: Neck supple.  Lymphadenopathy:     Cervical: No cervical adenopathy.  Skin:    General: Skin is warm and dry.  Neurological:     Mental Status: He is alert.      Assessment/Plan Heme positive stool and anemia. History of NSAID use. Diagnostic esophagogastroduodenoscopy and colonoscopy.  Hildred Laser, MD 06/12/2020, 8:26 AM

## 2020-06-12 NOTE — Transfer of Care (Signed)
Immediate Anesthesia Transfer of Care Note  Patient: Bruce Stewart  Procedure(s) Performed: COLONOSCOPY WITH PROPOFOL (N/A ) ESOPHAGOGASTRODUODENOSCOPY (EGD) WITH PROPOFOL (N/A ) BIOPSY POLYPECTOMY  Patient Location: PACU  Anesthesia Type:General  Level of Consciousness: awake, alert  and patient cooperative  Airway & Oxygen Therapy: Patient Spontanous Breathing  Post-op Assessment: Report given to RN and Post -op Vital signs reviewed and stable  Post vital signs: Reviewed and stable  Last Vitals:  Vitals Value Taken Time  BP    Temp 98.2   Pulse 93 06/12/20 0920  Resp 14 06/12/20 0920  SpO2 91 % 06/12/20 0920  Vitals shown include unvalidated device data.  Last Pain:  Vitals:   06/12/20 0730  TempSrc: Oral  PainSc: 0-No pain         Complications: No complications documented.

## 2020-06-12 NOTE — Anesthesia Preprocedure Evaluation (Signed)
Anesthesia Evaluation  Patient identified by MRN, date of birth, ID band Patient awake    Reviewed: Allergy & Precautions, H&P , NPO status , Patient's Chart, lab work & pertinent test results, reviewed documented beta blocker date and time   History of Anesthesia Complications (+) PONV and history of anesthetic complications  Airway Mallampati: II  TM Distance: >3 FB Neck ROM: full    Dental no notable dental hx. (+) Teeth Intact   Pulmonary neg pulmonary ROS,    Pulmonary exam normal breath sounds clear to auscultation       Cardiovascular Exercise Tolerance: Good + Past MI   Rhythm:regular Rate:Normal     Neuro/Psych negative neurological ROS  negative psych ROS   GI/Hepatic negative GI ROS, Neg liver ROS,   Endo/Other  negative endocrine ROS  Renal/GU Renal disease  negative genitourinary   Musculoskeletal   Abdominal   Peds  Hematology negative hematology ROS (+)   Anesthesia Other Findings Recent MI 8 months ago.  Cardiology cleared for large back surgery at the end of August.  No additional testing required at this time for EGD/Colon Propofol anesthesia.  Reproductive/Obstetrics negative OB ROS                             Anesthesia Physical Anesthesia Plan  ASA: II  Anesthesia Plan: General   Post-op Pain Management:    Induction:   PONV Risk Score and Plan:   Airway Management Planned:   Additional Equipment:   Intra-op Plan:   Post-operative Plan:   Informed Consent: I have reviewed the patients History and Physical, chart, labs and discussed the procedure including the risks, benefits and alternatives for the proposed anesthesia with the patient or authorized representative who has indicated his/her understanding and acceptance.     Dental Advisory Given  Plan Discussed with: CRNA  Anesthesia Plan Comments:         Anesthesia Quick Evaluation

## 2020-06-13 ENCOUNTER — Other Ambulatory Visit: Payer: Self-pay

## 2020-06-13 LAB — SURGICAL PATHOLOGY

## 2020-06-14 ENCOUNTER — Other Ambulatory Visit (INDEPENDENT_AMBULATORY_CARE_PROVIDER_SITE_OTHER): Payer: Self-pay | Admitting: *Deleted

## 2020-06-14 DIAGNOSIS — R112 Nausea with vomiting, unspecified: Secondary | ICD-10-CM

## 2020-06-14 DIAGNOSIS — Z01812 Encounter for preprocedural laboratory examination: Secondary | ICD-10-CM

## 2020-06-17 ENCOUNTER — Encounter (HOSPITAL_COMMUNITY): Payer: Self-pay | Admitting: Internal Medicine

## 2020-06-17 NOTE — Patient Instructions (Signed)
DUE TO COVID-19 ONLY ONE VISITOR IS ALLOWED TO COME WITH YOU AND STAY IN THE WAITING ROOM ONLY DURING PRE OP AND PROCEDURE DAY OF SURGERY. THE 1 VISITOR  MAY VISIT WITH YOU AFTER SURGERY IN YOUR PRIVATE ROOM DURING VISITING HOURS ONLY!  YOU NEED TO HAVE A COVID 19 TEST ON: 06/22/20 @          , THIS TEST MUST BE DONE BEFORE SURGERY,  COVID TESTING SITE [place] [address], IT IS ON THE RIGHT GOING OUT [address] APPROXIMATELY  2 MINUTES PAST [employer] ON THE RIGHT. ONCE YOUR COVID TEST IS COMPLETED,  PLEASE BEGIN THE QUARANTINE INSTRUCTIONS AS OUTLINED IN YOUR HANDOUT.                [facility] [place]    Your procedure is scheduled on: 06/26/20   Report to [facility] Main  Entrance   Report to admitting at: 8:20 AM     Call this number if you have problems the morning of surgery [phone]    Remember:   NO SOLID FOOD AFTER MIDNIGHT THE NIGHT PRIOR TO SURGERY. NOTHING BY MOUTH EXCEPT CLEAR LIQUIDS UNTIL: 7:50 am . PLEASE FINISH ENSURE DRINK PER SURGEON ORDER  WHICH NEEDS TO BE COMPLETED AT: 7:50 am .  CLEAR LIQUID DIET   Foods Allowed                                                                     Foods Excluded  Coffee and tea, regular and decaf                             liquids that you cannot  Plain Jell-O any favor except red or purple                                           see through such as: Fruit ices (not with fruit pulp)                                     milk, soups, orange juice  Iced Popsicles                                    All solid food Carbonated beverages, regular and diet                                    Cranberry, grape and apple juices Sports drinks like Gatorade Lightly seasoned clear broth or consume(fat free) Sugar, honey syrup  Sample Menu Breakfast                                Lunch  Supper Cranberry juice                    Beef broth                             Chicken broth Jell-O                                     Grape juice                           Apple juice Coffee or tea                        Jell-O                                      Popsicle                                                Coffee or tea                        Coffee or tea  _____________________________________________________________________   BRUSH YOUR TEETH MORNING OF SURGERY AND RINSE YOUR MOUTH OUT, NO CHEWING GUM CANDY OR MINTS.     Take these medicines the morning of surgery with A SIP OF WATER: pantoprazole,carvedilol,duloxetine,gabapentin,tamsulosin.                                You may not have any metal on your body including hair pins and              piercings  Do not wear jewelry, make-up, lotions, powders or perfumes, deodorant             Do not wear nail polish on your fingernails.  Do not shave  48 hours prior to surgery.            Do not bring valuables to the hospital. [place].  Contacts, dentures or bridgework may not be worn into surgery.  Leave suitcase in the car. After surgery it may be brought to your room.     Patients discharged the day of surgery will not be allowed to drive home. IF YOU ARE HAVING SURGERY AND GOING HOME THE SAME DAY, YOU MUST HAVE AN ADULT TO DRIVE YOU HOME AND BE WITH YOU FOR 24 HOURS. YOU MAY GO HOME BY TAXI OR UBER OR ORTHERWISE, BUT AN ADULT MUST ACCOMPANY YOU HOME AND STAY WITH YOU FOR 24 HOURS.  Name and phone number of your driver:  Special Instructions: N/A              Please read over the following fact sheets you were given: _____________________________________________________________________          [facility] - Preparing for Surgery Before surgery, you can play an important role.  Because skin is not sterile, your skin needs  to be as free of germs as possible.  You can reduce the number of germs on your skin by washing with CHG  (chlorahexidine gluconate) soap before surgery.  CHG is an antiseptic cleaner which kills germs and bonds with the skin to continue killing germs even after washing. Please DO NOT use if you have an allergy to CHG or antibacterial soaps.  If your skin becomes reddened/irritated stop using the CHG and inform your nurse when you arrive at Short Stay. Do not shave (including legs and underarms) for at least 48 hours prior to the first CHG shower.  You may shave your face/neck. Please follow these instructions carefully:  1.  Shower with CHG Soap the night before surgery and the  morning of Surgery.  2.  If you choose to wash your hair, wash your hair first as usual with your  normal  shampoo.  3.  After you shampoo, rinse your hair and body thoroughly to remove the  shampoo.                           4.  Use CHG as you would any other liquid soap.  You can apply chg directly  to the skin and wash                       Gently with a scrungie or clean washcloth.  5.  Apply the CHG Soap to your body ONLY FROM THE NECK DOWN.   Do not use on face/ open                           Wound or open sores. Avoid contact with eyes, ears mouth and genitals (private parts).                       Wash face,  Genitals (private parts) with your normal soap.             6.  Wash thoroughly, paying special attention to the area where your surgery  will be performed.  7.  Thoroughly rinse your body with warm water from the neck down.  8.  DO NOT shower/wash with your normal soap after using and rinsing off  the CHG Soap.                9.  Pat yourself dry with a clean towel.            10.  Wear clean pajamas.            11.  Place clean sheets on your bed the night of your first shower and do not  sleep with pets. Day of Surgery : Do not apply any lotions/deodorants the morning of surgery.  Please wear clean clothes to the hospital/surgery center.  FAILURE TO FOLLOW THESE INSTRUCTIONS MAY RESULT IN THE CANCELLATION OF  YOUR SURGERY PATIENT SIGNATURE_________________________________  NURSE SIGNATURE__________________________________  ________________________________________________________________________   Adam Phenix  An incentive spirometer is a tool that can help keep your lungs clear and active. This tool measures how well you are filling your lungs with each breath. Taking long deep breaths may help reverse or decrease the chance of developing breathing (pulmonary) problems (especially infection) following:  A long period of time when you are unable to move or be active. BEFORE THE PROCEDURE   If the spirometer includes an indicator to show  your best effort, your nurse or respiratory therapist will set it to a desired goal.  If possible, sit up straight or lean slightly forward. Try not to slouch.  Hold the incentive spirometer in an upright position. INSTRUCTIONS FOR USE  1. Sit on the edge of your bed if possible, or sit up as far as you can in bed or on a chair. 2. Hold the incentive spirometer in an upright position. 3. Breathe out normally. 4. Place the mouthpiece in your mouth and seal your lips tightly around it. 5. Breathe in slowly and as deeply as possible, raising the piston or the ball toward the top of the column. 6. Hold your breath for 3-5 seconds or for as long as possible. Allow the piston or ball to fall to the bottom of the column. 7. Remove the mouthpiece from your mouth and breathe out normally. 8. Rest for a few seconds and repeat Steps 1 through 7 at least 10 times every 1-2 hours when you are awake. Take your time and take a few normal breaths between deep breaths. 9. The spirometer may include an indicator to show your best effort. Use the indicator as a goal to work toward during each repetition. 10. After each set of 10 deep breaths, practice coughing to be sure your lungs are clear. If you have an incision (the cut made at the time of surgery), support your  incision when coughing by placing a pillow or rolled up towels firmly against it. Once you are able to get out of bed, walk around indoors and cough well. You may stop using the incentive spirometer when instructed by your caregiver.  RISKS AND COMPLICATIONS  Take your time so you do not get dizzy or light-headed.  If you are in pain, you may need to take or ask for pain medication before doing incentive spirometry. It is harder to take a deep breath if you are having pain. AFTER USE  Rest and breathe slowly and easily.  It can be helpful to keep track of a log of your progress. Your caregiver can provide you with a simple table to help with this. If you are using the spirometer at home, follow these instructions: [place] IF:   You are having difficultly using the spirometer.  You have trouble using the spirometer as often as instructed.  Your pain medication is not giving enough relief while using the spirometer.  You develop fever of 100.5 F (38.1 C) or higher. SEEK IMMEDIATE MEDICAL CARE IF:   You cough up bloody sputum that had not been present before.  You develop fever of 102 F (38.9 C) or greater.  You develop worsening pain at or near the incision site. MAKE SURE YOU:   Understand these instructions.  Will watch your condition.  Will get help right away if you are not doing well or get worse. Document Released: 03/01/2007 Document Revised: 01/11/2012 Document Reviewed: 05/02/2007 [facility] Patient Information 2014 [place], [place].   ________________________________________________________________________

## 2020-06-18 ENCOUNTER — Encounter (HOSPITAL_COMMUNITY)
Admission: RE | Admit: 2020-06-18 | Discharge: 2020-06-18 | Disposition: A | Discharge status: Home / Self Care | Payer: Medicare Other | Source: Ambulatory Visit | Attending: Orthopedic Surgery | Admitting: Orthopedic Surgery

## 2020-06-18 ENCOUNTER — Encounter (HOSPITAL_COMMUNITY): Payer: Self-pay

## 2020-06-18 ENCOUNTER — Other Ambulatory Visit: Payer: Self-pay

## 2020-06-18 DIAGNOSIS — Z01812 Encounter for preprocedural laboratory examination: Secondary | ICD-10-CM | POA: Insufficient documentation

## 2020-06-18 HISTORY — DX: Unspecified osteoarthritis, unspecified site: M19.90

## 2020-06-18 HISTORY — DX: Atherosclerotic heart disease of native coronary artery without angina pectoris: I25.10

## 2020-06-18 HISTORY — DX: Anemia, unspecified: D64.9

## 2020-06-18 LAB — COMPREHENSIVE METABOLIC PANEL
ALT: 17 U/L (ref 0–44)
AST: 20 U/L (ref 15–41)
Albumin: 3.8 g/dL (ref 3.5–5.0)
Alkaline Phosphatase: 49 U/L (ref 38–126)
Anion gap: 7 (ref 5–15)
BUN: 21 mg/dL (ref 8–23)
CO2: 28 mmol/L (ref 22–32)
Calcium: 8.8 mg/dL — ABNORMAL LOW (ref 8.9–10.3)
Chloride: 103 mmol/L (ref 98–111)
Creatinine, Ser: 1.44 mg/dL — ABNORMAL HIGH (ref 0.61–1.24)
GFR calc Af Amer: 55 mL/min — ABNORMAL LOW (ref >60)
GFR calc non Af Amer: 47 mL/min — ABNORMAL LOW (ref >60)
Glucose, Bld: 88 mg/dL (ref 70–99)
Potassium: 4.4 mmol/L (ref 3.5–5.1)
Sodium: 138 mmol/L (ref 135–145)
Total Bilirubin: 0.3 mg/dL (ref 0.3–1.2)
Total Protein: 6.1 g/dL — ABNORMAL LOW (ref 6.5–8.1)

## 2020-06-18 LAB — PROTIME-INR
INR: 1 (ref 0.8–1.2)
Prothrombin Time: 12.5 seconds (ref 11.4–15.2)

## 2020-06-18 LAB — CBC
HCT: 37.4 % — ABNORMAL LOW (ref 39.0–52.0)
Hemoglobin: 12.1 g/dL — ABNORMAL LOW (ref 13.0–17.0)
MCH: 31.2 pg (ref 26.0–34.0)
MCHC: 32.4 g/dL (ref 30.0–36.0)
MCV: 96.4 fL (ref 80.0–100.0)
Platelets: 232 10*3/uL (ref 150–400)
RBC: 3.88 MIL/uL — ABNORMAL LOW (ref 4.22–5.81)
RDW: 13.6 % (ref 11.5–15.5)
WBC: 7.5 10*3/uL (ref 4.0–10.5)
nRBC: 0 % (ref 0.0–0.2)

## 2020-06-18 LAB — SURGICAL PCR SCREEN
MRSA, PCR: NEGATIVE
Staphylococcus aureus: NEGATIVE

## 2020-06-18 LAB — APTT: aPTT: 30 seconds (ref 24–36)

## 2020-06-18 NOTE — Progress Notes (Signed)
COVID Vaccine Completed:Yes Date COVID Vaccine completed:12/27/19 COVID vaccine manufacturer: Pfizer    *[employer]   PCP - Dr. Consuello Masse. Cardiologist - Dr. Jodelle Gross Assar.: Clearance: 06/04/20. EPIC  Chest x-ray -  EKG - 06/10/20 Stress Test -  ECHO -  Cardiac Cath -   Sleep Study -  CPAP -   Fasting Blood Sugar -  Checks Blood Sugar _____ times a day  Blood Thinner Instructions: To hold Brilinta for 48 hrs. Before surgery. Per Dr. Candis Musa. Aspirin Instructions:  Last Dose:  Anesthesia review: Hx: MI,CAD,HTN.  Patient denies shortness of breath, fever, cough and chest pain at PAT appointment   Patient verbalized understanding of instructions that were given to them at the PAT appointment. Patient was also instructed that they will need to review over the PAT instructions again at home before surgery.

## 2020-06-18 NOTE — Patient Instructions (Signed)
DUE TO COVID-19 ONLY ONE VISITOR IS ALLOWED TO COME WITH YOU AND STAY IN THE WAITING ROOM ONLY DURING PRE OP AND PROCEDURE DAY OF SURGERY. THE 1 VISITOR  MAY VISIT WITH YOU AFTER SURGERY IN YOUR PRIVATE ROOM DURING VISITING HOURS ONLY!  YOU NEED TO HAVE A COVID 19 TEST ON: 06/24/20 @ 1:00 pm, THIS TEST MUST BE DONE BEFORE SURGERY,  COVID TESTING SITE [place] [address], IT IS ON THE RIGHT GOING OUT [address] APPROXIMATELY  2 MINUTES PAST [employer] ON THE RIGHT. ONCE YOUR COVID TEST IS COMPLETED,  PLEASE BEGIN THE QUARANTINE INSTRUCTIONS AS OUTLINED IN YOUR HANDOUT.                Issac L Clymer    Your procedure is scheduled on: 06/26/20   Report to [facility] Main  Entrance   Report to admitting at: 8:20 AM     Call this number if you have problems the morning of surgery [phone]    Remember:   NO SOLID FOOD AFTER MIDNIGHT THE NIGHT PRIOR TO SURGERY. NOTHING BY MOUTH EXCEPT CLEAR LIQUIDS UNTIL: 7:50 am . PLEASE FINISH ENSURE DRINK PER SURGEON ORDER  WHICH NEEDS TO BE COMPLETED AT: 7:50 am .   CLEAR LIQUID DIET   Foods Allowed                                                                     Foods Excluded  Coffee and tea, regular and decaf                             liquids that you cannot  Plain Jell-O any favor except red or purple                                           see through such as: Fruit ices (not with fruit pulp)                                     milk, soups, orange juice  Iced Popsicles                                    All solid food Carbonated beverages, regular and diet                                    Cranberry, grape and apple juices Sports drinks like Gatorade Lightly seasoned clear broth or consume(fat free) Sugar, honey syrup  Sample Menu Breakfast                                Lunch  Supper Cranberry juice                    Beef broth                             Chicken broth Jell-O                                     Grape juice                           Apple juice Coffee or tea                        Jell-O                                      Popsicle                                                Coffee or tea                        Coffee or tea  _____________________________________________________________________  BRUSH YOUR TEETH MORNING OF SURGERY AND RINSE YOUR MOUTH OUT, NO CHEWING GUM CANDY OR MINTS.     Take these medicines the morning of surgery with A SIP OF WATER: pantoprazole,carvedilol,duloxetine,gabapentin,tamsulosin  DO NOT TAKE ANY DIABETIC MEDICATIONS DAY OF YOUR SURGERY                               You may not have any metal on your body including hair pins and              piercings  Do not wear jewelry, lotions, powders or perfumes, deodorant             Men may shave face and neck.   Do not bring valuables to the hospital. [place].  Contacts, dentures or bridgework may not be worn into surgery.  Leave suitcase in the car. After surgery it may be brought to your room.     Patients discharged the day of surgery will not be allowed to drive home. IF YOU ARE HAVING SURGERY AND GOING HOME THE SAME DAY, YOU MUST HAVE AN ADULT TO DRIVE YOU HOME AND BE WITH YOU FOR 24 HOURS. YOU MAY GO HOME BY TAXI OR UBER OR ORTHERWISE, BUT AN ADULT MUST ACCOMPANY YOU HOME AND STAY WITH YOU FOR 24 HOURS.  Name and phone number of your driver:  Special Instructions: N/A              Please read over the following fact sheets you were given: _____________________________________________________________________       [facility] - Preparing for Surgery Before surgery, you can play an important role.  Because skin is not sterile, your skin needs to be as free of germs as possible.  You can reduce the number of germs  on your skin by washing with CHG (chlorahexidine gluconate) soap  before surgery.  CHG is an antiseptic cleaner which kills germs and bonds with the skin to continue killing germs even after washing. Please DO NOT use if you have an allergy to CHG or antibacterial soaps.  If your skin becomes reddened/irritated stop using the CHG and inform your nurse when you arrive at Short Stay. Do not shave (including legs and underarms) for at least 48 hours prior to the first CHG shower.  You may shave your face/neck. Please follow these instructions carefully:  1.  Shower with CHG Soap the night before surgery and the  morning of Surgery.  2.  If you choose to wash your hair, wash your hair first as usual with your  normal  shampoo.  3.  After you shampoo, rinse your hair and body thoroughly to remove the  shampoo.                           4.  Use CHG as you would any other liquid soap.  You can apply chg directly  to the skin and wash                       Gently with a scrungie or clean washcloth.  5.  Apply the CHG Soap to your body ONLY FROM THE NECK DOWN.   Do not use on face/ open                           Wound or open sores. Avoid contact with eyes, ears mouth and genitals (private parts).                       Wash face,  Genitals (private parts) with your normal soap.             6.  Wash thoroughly, paying special attention to the area where your surgery  will be performed.  7.  Thoroughly rinse your body with warm water from the neck down.  8.  DO NOT shower/wash with your normal soap after using and rinsing off  the CHG Soap.                9.  Pat yourself dry with a clean towel.            10.  Wear clean pajamas.            11.  Place clean sheets on your bed the night of your first shower and do not  sleep with pets. Day of Surgery : Do not apply any lotions/deodorants the morning of surgery.  Please wear clean clothes to the hospital/surgery center.  FAILURE TO FOLLOW THESE INSTRUCTIONS MAY RESULT IN THE CANCELLATION OF YOUR SURGERY PATIENT  SIGNATURE_________________________________  NURSE SIGNATURE__________________________________  ________________________________________________________________________   Adam Phenix  An incentive spirometer is a tool that can help keep your lungs clear and active. This tool measures how well you are filling your lungs with each breath. Taking long deep breaths may help reverse or decrease the chance of developing breathing (pulmonary) problems (especially infection) following:  A long period of time when you are unable to move or be active. BEFORE THE PROCEDURE   If the spirometer includes an indicator to show your best effort, your nurse or respiratory therapist will set it to a desired goal.  If possible, sit up straight or lean slightly forward. Try not to slouch.  Hold the incentive spirometer in an upright position. INSTRUCTIONS FOR USE  1. Sit on the edge of your bed if possible, or sit up as far as you can in bed or on a chair. 2. Hold the incentive spirometer in an upright position. 3. Breathe out normally. 4. Place the mouthpiece in your mouth and seal your lips tightly around it. 5. Breathe in slowly and as deeply as possible, raising the piston or the ball toward the top of the column. 6. Hold your breath for 3-5 seconds or for as long as possible. Allow the piston or ball to fall to the bottom of the column. 7. Remove the mouthpiece from your mouth and breathe out normally. 8. Rest for a few seconds and repeat Steps 1 through 7 at least 10 times every 1-2 hours when you are awake. Take your time and take a few normal breaths between deep breaths. 9. The spirometer may include an indicator to show your best effort. Use the indicator as a goal to work toward during each repetition. 10. After each set of 10 deep breaths, practice coughing to be sure your lungs are clear. If you have an incision (the cut made at the time of surgery), support your incision when coughing  by placing a pillow or rolled up towels firmly against it. Once you are able to get out of bed, walk around indoors and cough well. You may stop using the incentive spirometer when instructed by your caregiver.  RISKS AND COMPLICATIONS  Take your time so you do not get dizzy or light-headed.  If you are in pain, you may need to take or ask for pain medication before doing incentive spirometry. It is harder to take a deep breath if you are having pain. AFTER USE  Rest and breathe slowly and easily.  It can be helpful to keep track of a log of your progress. Your caregiver can provide you with a simple table to help with this. If you are using the spirometer at home, follow these instructions: [place] IF:   You are having difficultly using the spirometer.  You have trouble using the spirometer as often as instructed.  Your pain medication is not giving enough relief while using the spirometer.  You develop fever of 100.5 F (38.1 C) or higher. SEEK IMMEDIATE MEDICAL CARE IF:   You cough up bloody sputum that had not been present before.  You develop fever of 102 F (38.9 C) or greater.  You develop worsening pain at or near the incision site. MAKE SURE YOU:   Understand these instructions.  Will watch your condition.  Will get help right away if you are not doing well or get worse. Document Released: 03/01/2007 Document Revised: 01/11/2012 Document Reviewed: 05/02/2007 [facility] Patient Information 2014 [place], [place].   ________________________________________________________________________

## 2020-06-19 ENCOUNTER — Ambulatory Visit (HOSPITAL_COMMUNITY)
Admission: RE | Admit: 2020-06-19 | Discharge: 2020-06-19 | Disposition: A | Discharge status: Home / Self Care | Payer: Medicare Other | Source: Ambulatory Visit | Attending: Internal Medicine | Admitting: Internal Medicine

## 2020-06-19 DIAGNOSIS — K469 Unspecified abdominal hernia without obstruction or gangrene: Secondary | ICD-10-CM | POA: Diagnosis not present

## 2020-06-19 DIAGNOSIS — I700 Atherosclerosis of aorta: Secondary | ICD-10-CM | POA: Insufficient documentation

## 2020-06-19 DIAGNOSIS — N200 Calculus of kidney: Secondary | ICD-10-CM | POA: Diagnosis not present

## 2020-06-19 DIAGNOSIS — R112 Nausea with vomiting, unspecified: Secondary | ICD-10-CM | POA: Diagnosis not present

## 2020-06-19 DIAGNOSIS — K439 Ventral hernia without obstruction or gangrene: Secondary | ICD-10-CM | POA: Diagnosis not present
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
## 2020-06-19 MED ORDER — IOHEXOL 300 MG/ML  SOLN
100.0000 mL | Freq: Once | INTRAMUSCULAR | Status: AC | PRN
  Administered 2020-06-19: 100 mL via INTRAVENOUS

## 2020-06-19 NOTE — Anesthesia Preprocedure Evaluation (Addendum)
Anesthesia Evaluation  Patient identified by MRN, date of birth, ID band Patient awake    Reviewed: Allergy & Precautions, NPO status , Patient's Chart, lab work & pertinent test results  History of Anesthesia Complications (+) PONV and history of anesthetic complications  Airway Mallampati: III  TM Distance: >3 FB Neck ROM: Full    Dental  (+) Dental Advisory Given, Teeth Intact   Pulmonary neg pulmonary ROS,  Covid-19 Nucleic Acid Test Results Lab Results      Component                Value               Date                      [place]              NEGATIVE            06/24/2020                [place]              NEGATIVE            06/10/2020              breath sounds clear to auscultation       Cardiovascular (-) angina+ CAD, + Past MI and + Cardiac Stents  (-) CHF (-) dysrhythmias  Rhythm:Regular     Neuro/Psych negative neurological ROS  negative psych ROS   GI/Hepatic negative GI ROS, Neg liver ROS,   Endo/Other  negative endocrine ROS  Renal/GU CRFRenal diseaseLab Results      Component                Value               Date                      CREATININE               1.44 (H)            06/18/2020                Musculoskeletal  (+) Arthritis ,   Abdominal   Peds  Hematology  (+) Blood dyscrasia, anemia , Lab Results      Component                Value               Date                      WBC                      7.5                 06/18/2020                HGB                      12.1 (L)            06/18/2020                HCT                      37.4 (L)  06/18/2020                MCV                      96.4                06/18/2020                PLT                      232                 06/18/2020            Brilinta for 10/2019 cardiac stent    Anesthesia Other Findings   Reproductive/Obstetrics                             Anesthesia Physical Anesthesia Plan  ASA: III  Anesthesia Plan: General   Post-op Pain Management:    Induction: Intravenous  PONV Risk Score and Plan: 3 and Ondansetron and Dexamethasone  Airway Management Planned: Oral ETT  Additional Equipment: None  Intra-op Plan:   Post-operative Plan: Extubation in OR  Informed Consent: I have reviewed the patients History and Physical, chart, labs and discussed the procedure including the risks, benefits and alternatives for the proposed anesthesia with the patient or authorized representative who has indicated his/her understanding and acceptance.     Dental advisory given  Plan Discussed with: CRNA and Surgeon  Anesthesia Plan Comments: (See PAT note 06/10/2020, Konrad Felix, PA-C)       Anesthesia Quick Evaluation

## 2020-06-19 NOTE — Progress Notes (Signed)
Anesthesia Chart Review   Case: [id number] Date/Time: 06/26/20 1035   Procedure: TOTAL HIP ARTHROPLASTY ANTERIOR APPROACH (Left Hip) - 152min   Anesthesia type: Choice   Pre-op diagnosis: left hip osteoarthritis   Location: [facility] ROOM 10 / [facility]   Surgeons: Gaynelle Arabian, MD      DISCUSSION:73 y.o. never smoker with h/o PONV, CAD (NSTEMI s/p DES to mid LAD 10/2019), left hip OA scheduled for above procedure 06/26/2020 with Dr. Gaynelle Arabian.   Pt seen by cardiology 06/04/2020.  Per OV note,  "Preoperative cardiovascular risk assessment: - RCRI: 0.9% - Would like to proceed due to severe hip pain - Stop Brilinta 48 hours prior to elective his surgery. Restart post procedure as soon as possible when cleared by surgeon. - ASA should be continued perioperatively without interruption - Coreg should be continued perioperatively without interruption"  Anticipate pt can proceed with planned procedure barring acute status change.    VS: BP (!) 155/72   Pulse 72   Temp 36.9 C (Oral)   Resp 18   Ht 6\' 4"  (1.93 m)   Wt 124.7 kg   SpO2 100%   BMI 33.47 kg/m   PROVIDERS: Sasser, Silvestre Moment, MD is PCP   Cathie Hoops, MD is Cardiologist  LABS: Labs reviewed: Acceptable for surgery. (all labs ordered are listed, but only abnormal results are displayed)  Labs Reviewed  CBC WITH DIFFERENTIAL/PLATELET - Abnormal; Notable for the following components:      Result Value   RBC 3.75 (*)    Hemoglobin 11.7 (*)    HCT 36.2 (*)    All other components within normal limits  SARS CORONAVIRUS 2 (TAT 6-24 HRS)     IMAGES:   EKG: 06/10/2020 Rate 64 bpm  Normal sinus rhythm Right bundle branch block Left anterior fascicular block Bifascicular block Abnormal ECG  CV: Echo 05/31/2020 Summary  1. The left ventricle is normal in size with normal wall thickness.  2. The left ventricular systolic function is normal, LVEF is visually  estimated at 55%.  3. There is grade I diastolic  dysfunction (impaired relaxation).  4. There is mild aortic valve stenosis.  5. The left atrium is mildly dilated in size.  6. The right ventricle is normal in size, with normal systolic function.  Past Medical History:  Diagnosis Date  . Anal fissure   . Anemia   . Arthritis   . Complication of anesthesia   . Coronary artery disease   . Diverticulitis   . Family hx of colon cancer   . Kidney stone   . Myocardial infarction ([place]) 10/2019  . PONV (postoperative nausea and vomiting)     Past Surgical History:  Procedure Laterality Date  . back surgery x 6 Other]    . BIOPSY  02/11/2018   Procedure: BIOPSY;  Surgeon: Rogene Houston, MD;  [facility];  Service: Endoscopy;;  ileal  . BIOPSY  06/12/2020   Procedure: BIOPSY;  Surgeon: Rogene Houston, MD;  [facility];  Service: Endoscopy;;  antral, ileal  . CARDIAC CATHETERIZATION  10/2019  . CIRCUMCISION    . COLONOSCOPY N/A 09/12/2015   Procedure: COLONOSCOPY;  Surgeon: Rogene Houston, MD;  [facility];  Service: Endoscopy;  Laterality: N/A;  2:25-moved up to [place] notified pt  . COLONOSCOPY N/A 02/11/2018   Procedure: COLONOSCOPY;  Surgeon: Rogene Houston, MD;  [facility];  Service: Endoscopy;  Laterality: N/A;  9:15-moved to  [place] to notify pt  . COLONOSCOPY WITH PROPOFOL N/A 06/12/2020   Procedure: COLONOSCOPY WITH PROPOFOL;  Surgeon: Rogene Houston, MD;  [facility];  Service: Endoscopy;  Laterality: N/A;  830  . CORONARY ANGIOPLASTY WITH STENT PLACEMENT  10/2019  . ESOPHAGOGASTRODUODENOSCOPY (EGD) WITH PROPOFOL N/A 06/12/2020   Procedure: ESOPHAGOGASTRODUODENOSCOPY (EGD) WITH PROPOFOL;  Surgeon: Rogene Houston, MD;  [facility];  Service: Endoscopy;  Laterality: N/A;  . POLYPECTOMY  06/12/2020   Procedure: POLYPECTOMY;  Surgeon: Rogene Houston, MD;  [facility];  Service: Endoscopy;;  gatric  . repair of anal fissure      spincterotomy    MEDICATIONS: . acetaminophen (TYLENOL) 500 MG tablet  . aspirin EC 81 MG tablet  . buprenorphine (BUTRANS) 15 MCG/HR  . Calcium-Magnesium-Zinc (CAL-MAG-ZINC PO)  . carvedilol (COREG) 6.25 MG tablet  . Cholecalciferol 5000 units TABS  . cyanocobalamin 1000 MCG tablet  . diclofenac Sodium (VOLTAREN) 1 % GEL  . DULoxetine (CYMBALTA) 30 MG capsule  . Fish Oil-Cholecalciferol (FISH OIL + D3 PO)  . gabapentin (NEURONTIN) 300 MG capsule  . Moringa Oleifera (MORINGA PO)  . Multiple Vitamin (MULTIVITAMIN WITH MINERALS) TABS tablet  . pantoprazole (PROTONIX) 40 MG tablet  . Probiotic Product (PROBIOTIC DAILY) CAPS  . promethazine (PHENERGAN) 25 MG suppository  . rosuvastatin (CRESTOR) 20 MG tablet  . tamsulosin (FLOMAX) 0.4 MG CAPS capsule  . ticagrelor (BRILINTA) 90 MG TABS tablet  . tiZANidine (ZANAFLEX) 4 MG capsule   No current facility-administered medications for this encounter.    Konrad Felix, PA-C [facility] Testing [phone]

## 2020-06-20 NOTE — H&P (Signed)
TOTAL HIP ADMISSION H&P  Patient is admitted for left total hip arthroplasty.  Subjective:  Chief Complaint: Left hip pain  HPI: Bruce Stewart, 74 y.o. male, has a history of pain and functional disability in the left hip due to arthritis and patient has failed non-surgical conservative treatments for greater than 12 weeks to include corticosteriod injections and activity modification. Onset of symptoms was gradual, starting several years ago with gradually worsening course since that time. The patient noted no past surgery on the left hip. Patient currently rates pain in the left hip at 8 out of 10 with activity. Patient has worsening of pain with activity and weight bearing, pain that interfers with activities of daily living and crepitus. Patient has evidence of bone-on-bone arthritis in the left hip with some impingement morphology by imaging studies. This condition presents safety issues increasing the risk of falls. There is no current active infection.  Patient Active Problem List   Diagnosis Date Noted  . Abnormal CT of the abdomen 01/12/2018    Past Medical History:  Diagnosis Date  . Anal fissure   . Anemia   . Arthritis   . Complication of anesthesia   . Coronary artery disease   . Diverticulitis   . Family hx of colon cancer   . Kidney stone   . Myocardial infarction ([place]) 10/2019  . PONV (postoperative nausea and vomiting)     Past Surgical History:  Procedure Laterality Date  . back surgery x 6 Other]    . BIOPSY  02/11/2018   Procedure: BIOPSY;  Surgeon: Rogene Houston, MD;  [facility];  Service: Endoscopy;;  ileal  . BIOPSY  06/12/2020   Procedure: BIOPSY;  Surgeon: Rogene Houston, MD;  [facility];  Service: Endoscopy;;  antral, ileal  . CARDIAC CATHETERIZATION  10/2019  . CIRCUMCISION    . COLONOSCOPY N/A 09/12/2015   Procedure: COLONOSCOPY;  Surgeon: Rogene Houston, MD;  [facility];  Service: Endoscopy;   Laterality: N/A;  2:25-moved up to [place] notified pt  . COLONOSCOPY N/A 02/11/2018   Procedure: COLONOSCOPY;  Surgeon: Rogene Houston, MD;  [facility];  Service: Endoscopy;  Laterality: N/A;  9:15-moved to [address] to notify pt  . COLONOSCOPY WITH PROPOFOL N/A 06/12/2020   Procedure: COLONOSCOPY WITH PROPOFOL;  Surgeon: Rogene Houston, MD;  [facility];  Service: Endoscopy;  Laterality: N/A;  830  . CORONARY ANGIOPLASTY WITH STENT PLACEMENT  10/2019  . ESOPHAGOGASTRODUODENOSCOPY (EGD) WITH PROPOFOL N/A 06/12/2020   Procedure: ESOPHAGOGASTRODUODENOSCOPY (EGD) WITH PROPOFOL;  Surgeon: Rogene Houston, MD;  [facility];  Service: Endoscopy;  Laterality: N/A;  . POLYPECTOMY  06/12/2020   Procedure: POLYPECTOMY;  Surgeon: Rogene Houston, MD;  [facility];  Service: Endoscopy;;  gatric  . repair of anal fissure     spincterotomy    Prior to Admission medications   Medication Sig Start Date End Date Taking? Authorizing Provider  acetaminophen (TYLENOL) 500 MG tablet Take 1,000 mg by mouth every 8 (eight) hours as needed for mild pain or moderate pain.    [provider]  aspirin EC 81 MG tablet Take 81 mg by mouth daily. Swallow whole.    [provider]  buprenorphine (BUTRANS) 15 MCG/HR Place 1 patch onto the skin once a week.  04/03/20 06/26/20  [provider]  Calcium-Magnesium-Zinc (CAL-MAG-ZINC PO) Take 1 tablet by mouth at bedtime.  [provider]  carvedilol (COREG) 6.25 MG tablet Take 6.25 mg by mouth 2 (two) times daily with a meal.  10/06/19   [provider]  Cholecalciferol 5000 units TABS Take 5,000 Units by mouth daily.     [provider]  cyanocobalamin 1000 MCG tablet Take 1,000 mcg by mouth daily.    [provider]  diclofenac Sodium (VOLTAREN) 1 % GEL Apply 1 application topically daily as needed (pain).     [provider]  DULoxetine (CYMBALTA) 30 MG  capsule Take 30 mg by mouth daily.    [provider]  Fish Oil-Cholecalciferol (FISH OIL + D3 PO) Take 1,400 mg by mouth daily.     [provider]  gabapentin (NEURONTIN) 300 MG capsule Take 300 mg by mouth 2 (two) times daily.     [provider]  Moringa Oleifera (MORINGA PO) Take 1 capsule by mouth daily.     [provider]  Multiple Vitamin (MULTIVITAMIN WITH MINERALS) TABS tablet Take 1 tablet by mouth daily.    [provider]  pantoprazole (PROTONIX) 40 MG tablet Take 1 tablet (40 mg total) by mouth daily. Patient taking differently: Take 40 mg by mouth 2 (two) times daily.  05/23/20   Laurine Blazer A, PA-C  Probiotic Product (PROBIOTIC DAILY) CAPS Take 1 capsule by mouth daily.    [provider]  promethazine (PHENERGAN) 25 MG suppository Place 1 suppository (25 mg total) rectally 2 (two) times daily as needed for nausea or vomiting. 06/12/20   Rehman, Mechele Dawley, MD  rosuvastatin (CRESTOR) 20 MG tablet Take 20 mg by mouth daily.    [provider]  tamsulosin (FLOMAX) 0.4 MG CAPS capsule Take 0.4 mg by mouth in the morning and at bedtime.     [provider]  ticagrelor (BRILINTA) 90 MG TABS tablet Take 1 tablet (90 mg total) by mouth 2 (two) times daily. 06/13/20   Rehman, Mechele Dawley, MD  tiZANidine (ZANAFLEX) 4 MG capsule Take 4 mg by mouth 3 (three) times daily.    [provider]    Allergies  Allergen Reactions  . Codeine Itching  . Orange Fruit [Citrus] Itching  . Bee Venom Rash    Social History   Socioeconomic History  . Marital status: Married    Spouse name: Not on file  . Number of children: Not on file  . Years of education: Not on file  . Highest education level: Not on file  Occupational History  . Not on file  Tobacco Use  . Smoking status: Never Smoker  . Smokeless tobacco: Never Used  Vaping Use  . Vaping Use: Never used  Substance and Sexual Activity  . Alcohol use: No     Alcohol/week: 0.0 standard drinks  . Drug use: No  . Sexual activity: Not on file  Other Topics Concern  . Not on file  Social History Narrative  . Not on file   Social Determinants of Health   Financial Resource Strain:   . Difficulty of Paying Living Expenses: Not on file  Food Insecurity:   . Worried About Charity fundraiser in the Last Year: Not on file  . Ran Out of Food in the Last Year: Not on file  Transportation Needs:   . Lack of Transportation (Medical): Not on file  . Lack of Transportation (Non-Medical): Not on file  Physical Activity:   . Days of Exercise per Week: Not on file  . Minutes of  Exercise per Session: Not on file  Stress:   . Feeling of Stress : Not on file  Social Connections:   . Frequency of Communication with Friends and Family: Not on file  . Frequency of Social Gatherings with Friends and Family: Not on file  . Attends Religious Services: Not on file  . Active Member of Clubs or Organizations: Not on file  . Attends Archivist Meetings: Not on file  . Marital Status: Not on file  Intimate Partner Violence:   . Fear of Current or Ex-Partner: Not on file  . Emotionally Abused: Not on file  . Physically Abused: Not on file  . Sexually Abused: Not on file      Tobacco Use: Low Risk   . Smoking Tobacco Use: Never Smoker  . Smokeless Tobacco Use: Never Used   Social History   Substance and Sexual Activity  Alcohol Use No  . Alcohol/week: 0.0 standard drinks    Family History  Problem Relation Age of Onset  . Diabetes Mother   . Colon cancer Father     Review of Systems  Constitutional: Negative for chills and fever.  HENT: Negative for congestion, sore throat and tinnitus.   Eyes: Negative for double vision, photophobia and pain.  Respiratory: Negative for cough, shortness of breath and wheezing.   Cardiovascular: Negative for chest pain, palpitations and orthopnea.  Gastrointestinal: Negative for heartburn, nausea and  vomiting.  Genitourinary: Negative for dysuria, frequency and urgency.  Musculoskeletal: Positive for joint pain.  Neurological: Negative for dizziness, weakness and headaches.     Objective:  Physical Exam: Well nourished and well developed.  General: Alert and oriented x3, cooperative and pleasant, no acute distress.  Head: normocephalic, atraumatic, neck supple.  Eyes: EOMI.  Respiratory: breath sounds clear in all fields, no wheezing, rales, or rhonchi. Cardiovascular: Regular rate and rhythm, no murmurs, gallops or rubs.  Abdomen: non-tender to palpation and soft, normoactive bowel sounds. Musculoskeletal:  Left Hip Exam:  The range of motion: Flexion to 100 degrees, Internal Rotation to 0 degrees, External Rotation to 10 degrees, and abduction to 20 degrees without discomfort.  There is no tenderness over the greater trochanteric bursa.   Calves soft and nontender. Motor function intact in LE. Strength 5/5 LE bilaterally. Neuro: Distal pulses 2+. Sensation to light touch intact in LE.  Imaging Review Plain radiographs demonstrate severe degenerative joint disease of the left hip. The bone quality appears to be adequate for age and reported activity level.  Assessment/Plan:  End stage arthritis, left hip  The patient history, physical examination, clinical judgement of the provider and imaging studies are consistent with end stage degenerative joint disease of the left hip and total hip arthroplasty is deemed medically necessary. The treatment options including medical management, injection therapy, arthroscopy and arthroplasty were discussed at length. The risks and benefits of total hip arthroplasty were presented and reviewed. The risks due to aseptic loosening, infection, stiffness, dislocation/subluxation, thromboembolic complications and other imponderables were discussed. The patient acknowledged the explanation, agreed to proceed with the plan and consent was signed.  Patient is being admitted for inpatient treatment for surgery, pain control, PT, OT, prophylactic antibiotics, VTE prophylaxis, progressive ambulation and ADLs and discharge planning.The patient is planning to be discharged home.   Patient's anticipated LOS is less than 2 midnights, meeting these requirements: - Lives within 1 hour of care - Has a competent adult at home to recover with post-op recover - NO history of  - Chronic  pain requiring opioids  - Diabetes  - Heart failure  - Heart attack  - Stroke  - DVT/VTE  - Cardiac arrhythmia  - Respiratory Failure/COPD  - Renal failure  - Anemia  - Advanced Liver disease  Therapy Plans: HEP Disposition: Home with wife Planned DVT Prophylaxis: Brilinta 90 mg BID and 325 ASA QD DME Needed: None PCP: Consuello Masse, MD (clearance provided) Cardiologist: Cathie Hoops, MD (clearance provided) TXA: IV Allergies: Codeine (rash) Anesthesia Concerns: Vertigo; general anesthesia (hx extensive lumbar surgery) BMI: 33.9 Last HgbA1c: Not diabetic  Other: - Tolerates Norco, has had this in the past - Pharmacy: [facility] in [place], [place] - Hx CAD with stent placement  - Patient was instructed on what medications to stop prior to surgery. - Follow-up visit in 2 weeks with Dr. Wynelle Link - Begin physical therapy following surgery - Pre-operative lab work as pre-surgical testing - Prescriptions will be provided in hospital at time of discharge  Theresa Duty, PA-C Orthopedic Surgery [facility] [place]

## 2020-06-24 ENCOUNTER — Other Ambulatory Visit (HOSPITAL_COMMUNITY)
Admission: RE | Admit: 2020-06-24 | Discharge: 2020-06-24 | Disposition: A | Discharge status: Home / Self Care | Payer: Medicare Other | Source: Ambulatory Visit | Attending: Orthopedic Surgery | Admitting: Orthopedic Surgery

## 2020-06-24 DIAGNOSIS — Z20822 Contact with and (suspected) exposure to covid-19: Secondary | ICD-10-CM | POA: Diagnosis not present

## 2020-06-24 DIAGNOSIS — Z01812 Encounter for preprocedural laboratory examination: Secondary | ICD-10-CM | POA: Diagnosis not present

## 2020-06-24 LAB — SARS CORONAVIRUS 2 (TAT 6-24 HRS): SARS Coronavirus 2: NEGATIVE

## 2020-06-25 MED ORDER — DEXTROSE 5 % IV SOLN
3.0000 g | INTRAVENOUS | Status: AC
  Administered 2020-06-26: 3 g via INTRAVENOUS
  Filled 2020-06-25: qty 3

## 2020-06-26 ENCOUNTER — Ambulatory Visit (HOSPITAL_COMMUNITY): Payer: Medicare Other | Admitting: Physician Assistant

## 2020-06-26 ENCOUNTER — Encounter (HOSPITAL_COMMUNITY)
Admission: RE | Disposition: A | Discharge status: Home / Self Care | Payer: Self-pay | Source: Ambulatory Visit | Attending: Orthopedic Surgery

## 2020-06-26 ENCOUNTER — Observation Stay (HOSPITAL_COMMUNITY): Payer: Medicare Other

## 2020-06-26 ENCOUNTER — Ambulatory Visit (HOSPITAL_COMMUNITY): Payer: Medicare Other

## 2020-06-26 ENCOUNTER — Other Ambulatory Visit: Payer: Self-pay

## 2020-06-26 ENCOUNTER — Observation Stay (HOSPITAL_COMMUNITY)
Admission: RE | Admit: 2020-06-26 | Discharge: 2020-06-27 | Disposition: A | Discharge status: Home / Self Care | Payer: Medicare Other | Source: Ambulatory Visit | Attending: Orthopedic Surgery | Admitting: Orthopedic Surgery

## 2020-06-26 ENCOUNTER — Encounter (HOSPITAL_COMMUNITY): Payer: Self-pay | Admitting: Orthopedic Surgery

## 2020-06-26 ENCOUNTER — Ambulatory Visit (HOSPITAL_COMMUNITY): Payer: Medicare Other | Admitting: Certified Registered Nurse Anesthetist

## 2020-06-26 DIAGNOSIS — Z955 Presence of coronary angioplasty implant and graft: Secondary | ICD-10-CM | POA: Diagnosis not present

## 2020-06-26 DIAGNOSIS — I2510 Atherosclerotic heart disease of native coronary artery without angina pectoris: Secondary | ICD-10-CM | POA: Diagnosis not present

## 2020-06-26 DIAGNOSIS — Z96649 Presence of unspecified artificial hip joint: Secondary | ICD-10-CM

## 2020-06-26 DIAGNOSIS — D649 Anemia, unspecified: Secondary | ICD-10-CM | POA: Diagnosis not present

## 2020-06-26 DIAGNOSIS — Z7982 Long term (current) use of aspirin: Secondary | ICD-10-CM | POA: Insufficient documentation

## 2020-06-26 DIAGNOSIS — Z96642 Presence of left artificial hip joint: Secondary | ICD-10-CM | POA: Diagnosis not present

## 2020-06-26 DIAGNOSIS — Z79899 Other long term (current) drug therapy: Secondary | ICD-10-CM | POA: Insufficient documentation

## 2020-06-26 DIAGNOSIS — Z419 Encounter for procedure for purposes other than remedying health state, unspecified: Secondary | ICD-10-CM

## 2020-06-26 DIAGNOSIS — M169 Osteoarthritis of hip, unspecified: Secondary | ICD-10-CM | POA: Diagnosis present

## 2020-06-26 DIAGNOSIS — I252 Old myocardial infarction: Secondary | ICD-10-CM | POA: Insufficient documentation

## 2020-06-26 DIAGNOSIS — M1612 Unilateral primary osteoarthritis, left hip: Secondary | ICD-10-CM | POA: Diagnosis not present

## 2020-06-26 DIAGNOSIS — Z471 Aftercare following joint replacement surgery: Secondary | ICD-10-CM | POA: Diagnosis not present

## 2020-06-26 HISTORY — PX: TOTAL HIP ARTHROPLASTY: SHX124

## 2020-06-26 LAB — TYPE AND SCREEN
ABO/RH(D): A POS
Antibody Screen: NEGATIVE

## 2020-06-26 LAB — ABO/RH: ABO/RH(D): A POS

## 2020-06-26 IMAGING — DX DG PORTABLE PELVIS
1 series · 1 of 1 positions shown · non-contrast
Comparison: Intraoperative images [DATE].

CLINICAL DATA: Status post total hip replacement

EXAM:
PORTABLE PELVIS 1-2 VIEWS

[pelvis ap]
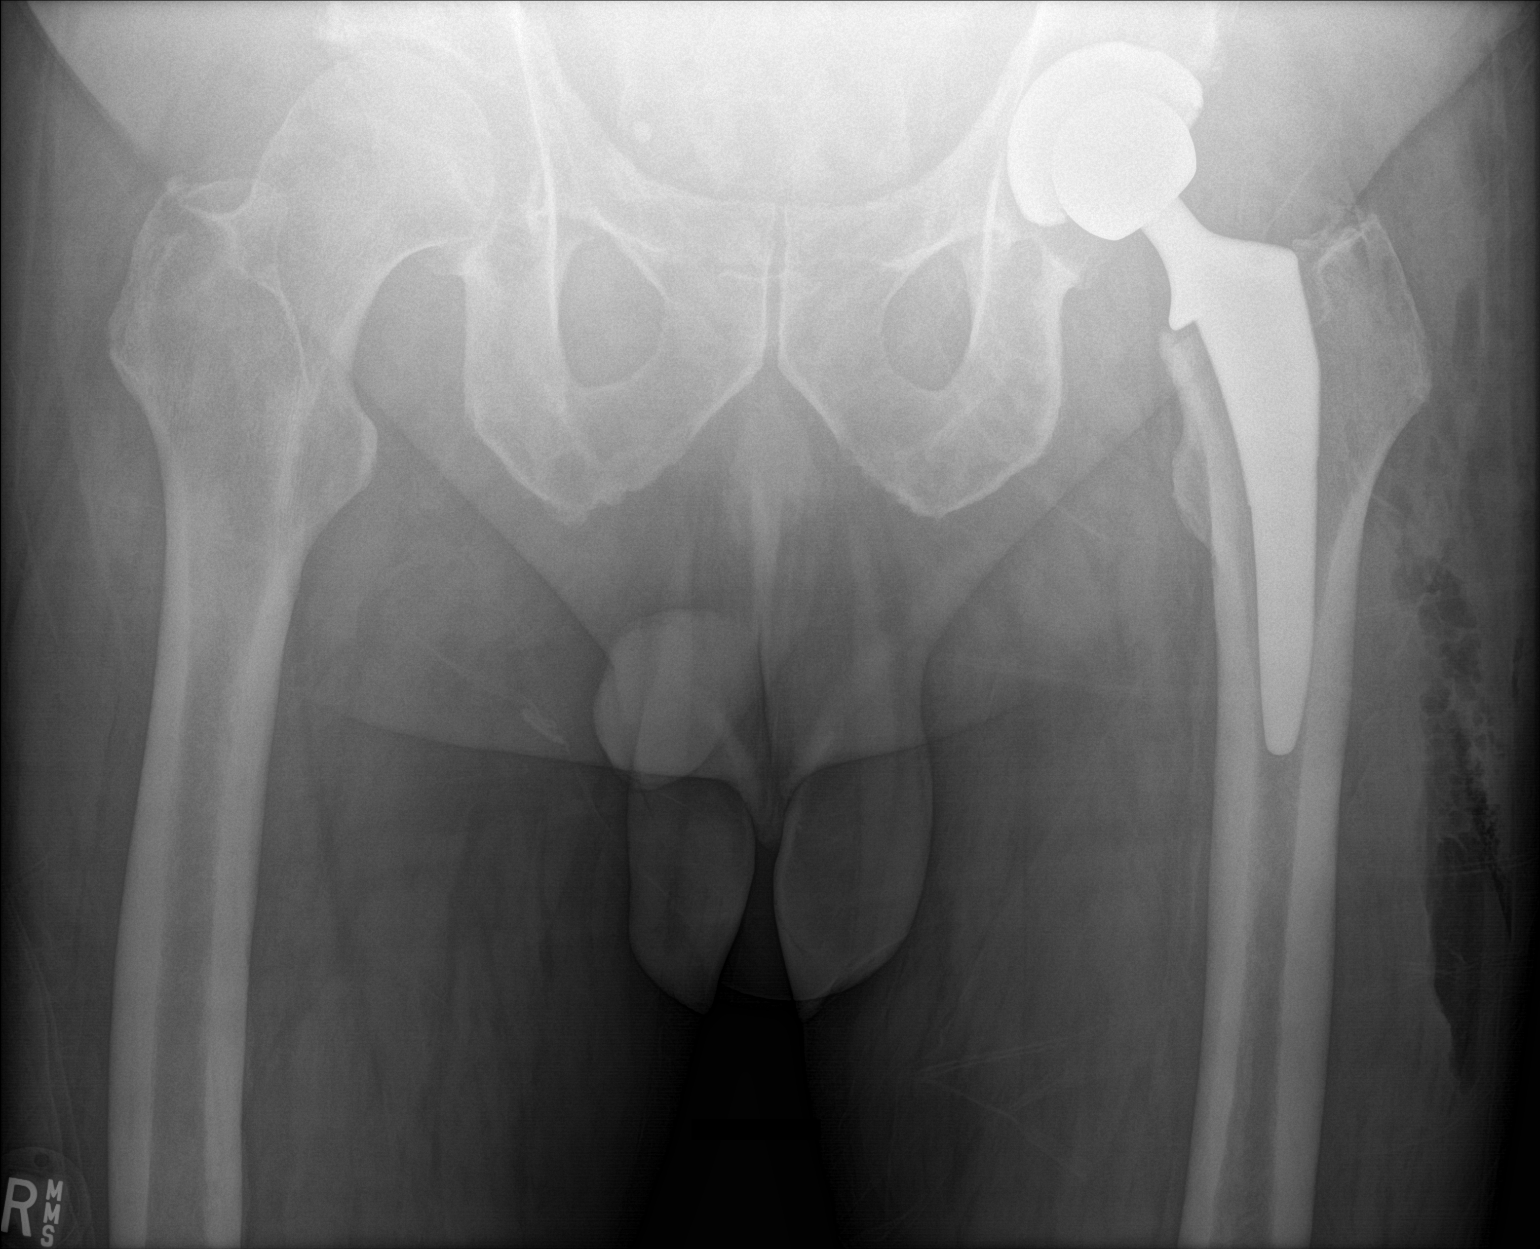

[1 of 1 positions shown; findings below may reference images not displayed]

FINDINGS: Frontal view of lower pelvis and bilateral hips obtained. There is a
total hip replacement on the left with prosthetic components
appearing well-seated on frontal view. No fracture or dislocation.
The right hip joint appears unremarkable. Soft tissue air noted on
the left.
IMPRESSION: Status post total hip replacement on the left with prosthetic
components appearing well-seated on frontal view. No fracture or
dislocation.

## 2020-06-26 IMAGING — RF DG HIP (WITH PELVIS) OPERATIVE*L*
1 series · 2 of 2 positions shown · non-contrast
Comparison: [DATE].

CLINICAL DATA: Elective surgery.

EXAM:
OPERATIVE left HIP (WITH PELVIS IF PERFORMED) 2 VIEWS
TECHNIQUE: Fluoroscopic spot image(s) were submitted for interpretation
post-operatively.
Radiation exposure index: 4.3698 mGy.

[Series 1: unknown protocol · 2 of 2 slices shown]
[im 1/2]
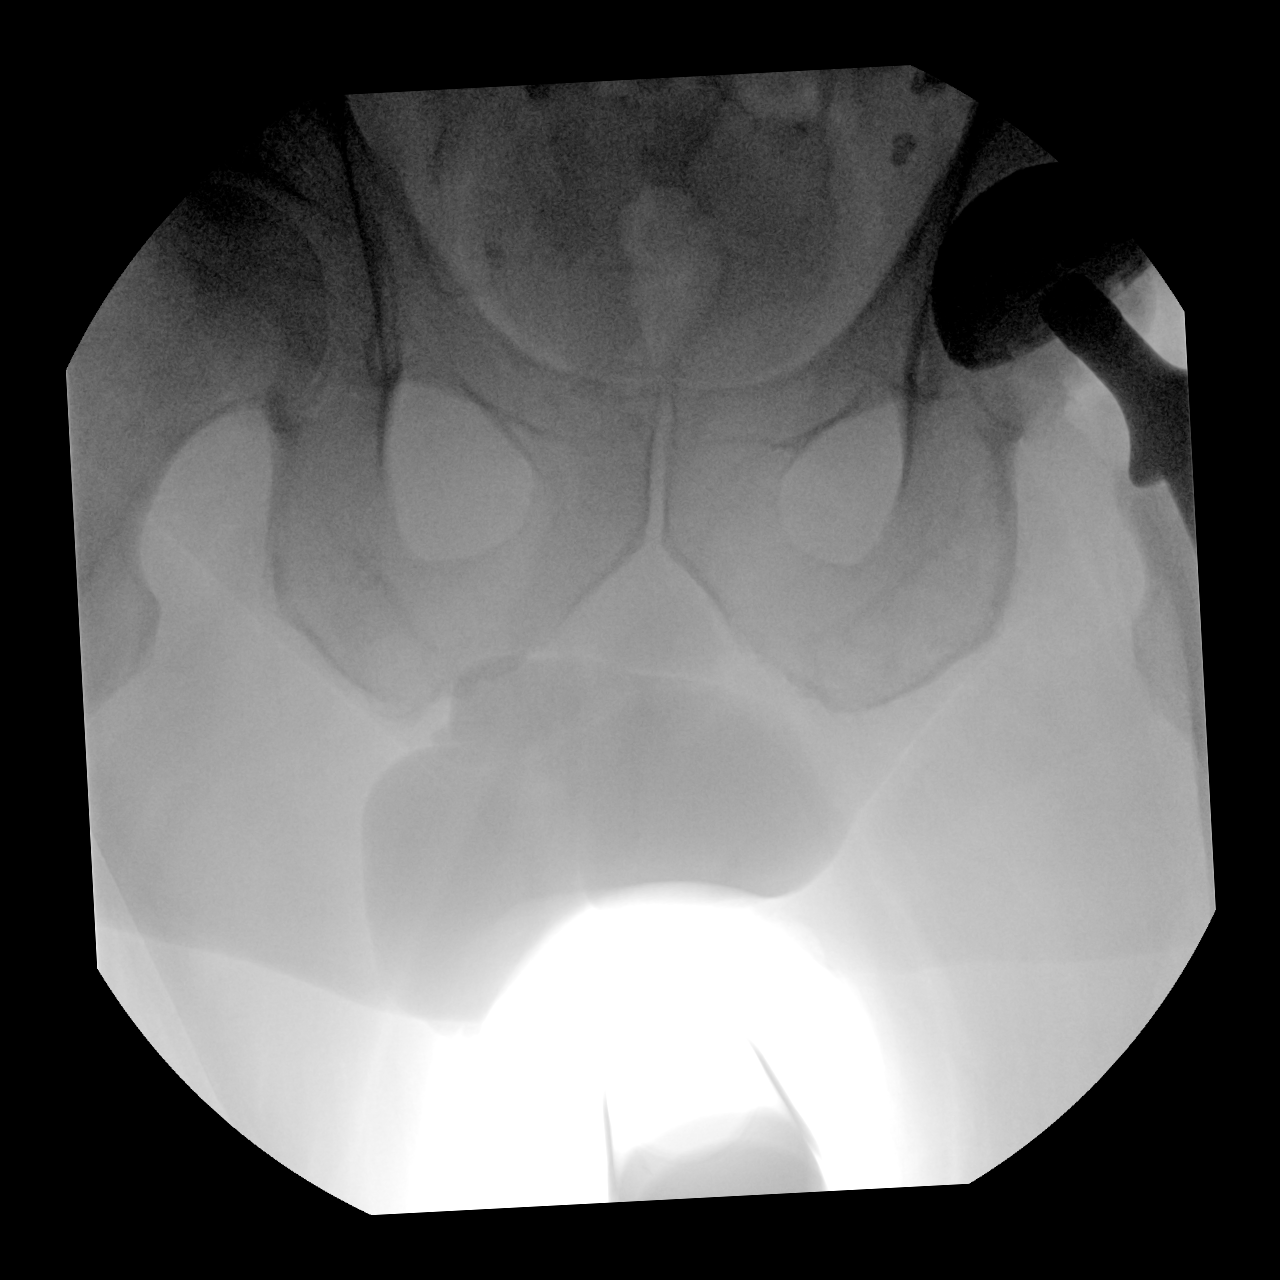
[im 2/2]
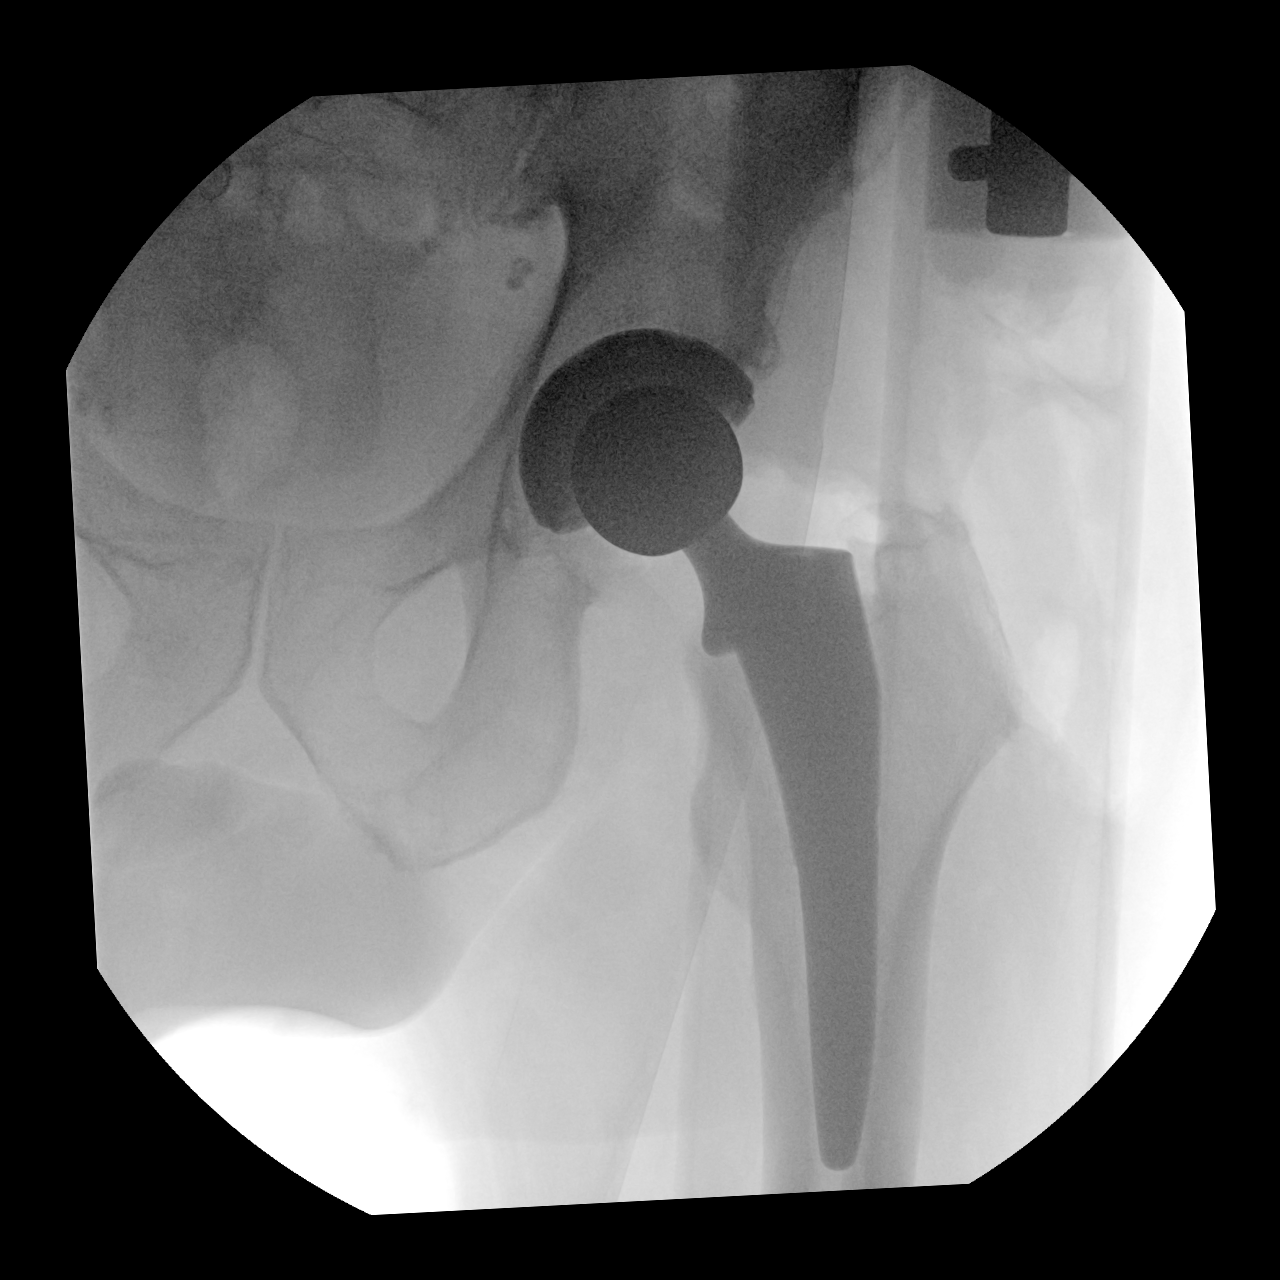

[2 of 2 positions shown; findings below may reference images not displayed]

FINDINGS: Two intraoperative fluoroscopic images demonstrate the left
acetabular and femoral components to be well situated.
IMPRESSION: Status post left total hip arthroplasty.

## 2020-06-26 SURGERY — ARTHROPLASTY, HIP, TOTAL, ANTERIOR APPROACH
Anesthesia: General | Site: Hip | Laterality: Left

## 2020-06-26 MED ORDER — FENTANYL CITRATE (PF) 100 MCG/2ML IJ SOLN
INTRAMUSCULAR | Status: AC
Start: 2020-06-26 — End: 2020-06-27
  Filled 2020-06-26: qty 2

## 2020-06-26 MED ORDER — ROCURONIUM BROMIDE 10 MG/ML (PF) SYRINGE
PREFILLED_SYRINGE | INTRAVENOUS | Status: AC
  Filled 2020-06-26: qty 10

## 2020-06-26 MED ORDER — ONDANSETRON HCL 4 MG/2ML IJ SOLN: 4.0000 mg | Freq: Four times a day (QID) | INTRAMUSCULAR | Status: DC | PRN

## 2020-06-26 MED ORDER — OXYCODONE HCL 5 MG PO TABS: 5.0000 mg | ORAL_TABLET | Freq: Once | ORAL | Status: DC | PRN

## 2020-06-26 MED ORDER — DEXAMETHASONE SODIUM PHOSPHATE 10 MG/ML IJ SOLN
INTRAMUSCULAR | Status: AC
  Filled 2020-06-26: qty 1

## 2020-06-26 MED ORDER — DEXAMETHASONE SODIUM PHOSPHATE 10 MG/ML IJ SOLN
8.0000 mg | Freq: Once | INTRAMUSCULAR | Status: AC
  Administered 2020-06-26: 8 mg via INTRAVENOUS

## 2020-06-26 MED ORDER — ROCURONIUM BROMIDE 10 MG/ML (PF) SYRINGE
PREFILLED_SYRINGE | INTRAVENOUS | Status: DC | PRN
  Administered 2020-06-26: 10 mg via INTRAVENOUS
  Administered 2020-06-26: 60 mg via INTRAVENOUS

## 2020-06-26 MED ORDER — CEFAZOLIN SODIUM-DEXTROSE 2-4 GM/100ML-% IV SOLN
2.0000 g | Freq: Four times a day (QID) | INTRAVENOUS | Status: AC
  Administered 2020-06-26 – 2020-06-27 (×2): 2 g via INTRAVENOUS
  Filled 2020-06-26 (×2): qty 100

## 2020-06-26 MED ORDER — ONDANSETRON HCL 4 MG/2ML IJ SOLN
INTRAMUSCULAR | Status: DC | PRN
  Administered 2020-06-26: 4 mg via INTRAVENOUS

## 2020-06-26 MED ORDER — ONDANSETRON HCL 4 MG PO TABS: 4.0000 mg | ORAL_TABLET | Freq: Four times a day (QID) | ORAL | Status: DC | PRN

## 2020-06-26 MED ORDER — ASPIRIN EC 325 MG PO TBEC
325.0000 mg | DELAYED_RELEASE_TABLET | Freq: Every day | ORAL | Status: DC
  Administered 2020-06-27: 325 mg via ORAL
  Filled 2020-06-26: qty 1

## 2020-06-26 MED ORDER — BISACODYL 10 MG RE SUPP: 10.0000 mg | Freq: Every day | RECTAL | Status: DC | PRN

## 2020-06-26 MED ORDER — SODIUM CHLORIDE 0.9 % IV SOLN: INTRAVENOUS | Status: DC

## 2020-06-26 MED ORDER — ROSUVASTATIN CALCIUM 20 MG PO TABS
20.0000 mg | ORAL_TABLET | Freq: Every day | ORAL | Status: DC
  Administered 2020-06-27: 20 mg via ORAL
  Filled 2020-06-26: qty 1

## 2020-06-26 MED ORDER — METHOCARBAMOL 500 MG IVPB - SIMPLE MED
500.0000 mg | Freq: Four times a day (QID) | INTRAVENOUS | Status: DC | PRN
  Filled 2020-06-26: qty 50

## 2020-06-26 MED ORDER — METOCLOPRAMIDE HCL 5 MG/ML IJ SOLN: 5.0000 mg | Freq: Three times a day (TID) | INTRAMUSCULAR | Status: DC | PRN

## 2020-06-26 MED ORDER — ACETAMINOPHEN 325 MG PO TABS: 325.0000 mg | ORAL_TABLET | Freq: Four times a day (QID) | ORAL | Status: DC | PRN

## 2020-06-26 MED ORDER — AMISULPRIDE (ANTIEMETIC) 5 MG/2ML IV SOLN
5.0000 mg | Freq: Once | INTRAVENOUS | Status: AC
  Administered 2020-06-26: 5 mg via INTRAVENOUS

## 2020-06-26 MED ORDER — FENTANYL CITRATE (PF) 250 MCG/5ML IJ SOLN
INTRAMUSCULAR | Status: AC
  Filled 2020-06-26: qty 5

## 2020-06-26 MED ORDER — PROPOFOL 10 MG/ML IV BOLUS
INTRAVENOUS | Status: AC
  Filled 2020-06-26: qty 40

## 2020-06-26 MED ORDER — OXYCODONE HCL 5 MG PO TABS
ORAL_TABLET | ORAL | Status: AC
Start: 2020-06-26 — End: 2020-06-27
  Filled 2020-06-26: qty 2

## 2020-06-26 MED ORDER — ONDANSETRON HCL 4 MG/2ML IJ SOLN
INTRAMUSCULAR | Status: AC
  Filled 2020-06-26: qty 2

## 2020-06-26 MED ORDER — SCOPOLAMINE 1 MG/3DAYS TD PT72
MEDICATED_PATCH | TRANSDERMAL | Status: AC
  Filled 2020-06-26: qty 1

## 2020-06-26 MED ORDER — HYDROMORPHONE HCL 1 MG/ML IJ SOLN
INTRAMUSCULAR | Status: AC
Start: 2020-06-26 — End: 2020-06-27
  Filled 2020-06-26: qty 2

## 2020-06-26 MED ORDER — WATER FOR IRRIGATION, STERILE IR SOLN
Status: DC | PRN
  Administered 2020-06-26: 2000 mL

## 2020-06-26 MED ORDER — FENTANYL CITRATE (PF) 100 MCG/2ML IJ SOLN
INTRAMUSCULAR | Status: DC | PRN
Start: 2020-06-26 — End: 2020-06-26
  Administered 2020-06-26: 100 ug via INTRAVENOUS
  Administered 2020-06-26 (×3): 50 ug via INTRAVENOUS

## 2020-06-26 MED ORDER — CARVEDILOL 6.25 MG PO TABS
6.2500 mg | ORAL_TABLET | Freq: Two times a day (BID) | ORAL | Status: DC
  Administered 2020-06-26 – 2020-06-27 (×2): 6.25 mg via ORAL
  Filled 2020-06-26 (×2): qty 1

## 2020-06-26 MED ORDER — BUPIVACAINE HCL 0.25 % IJ SOLN
INTRAMUSCULAR | Status: DC | PRN
  Administered 2020-06-26: 30 mL

## 2020-06-26 MED ORDER — ACETAMINOPHEN 10 MG/ML IV SOLN
INTRAVENOUS | Status: AC
  Filled 2020-06-26: qty 100

## 2020-06-26 MED ORDER — ACETAMINOPHEN 500 MG PO TABS: 1000.0000 mg | ORAL_TABLET | Freq: Once | ORAL | Status: DC | PRN

## 2020-06-26 MED ORDER — PHENYLEPHRINE HCL-NACL 10-0.9 MG/250ML-% IV SOLN
INTRAVENOUS | Status: DC | PRN
  Administered 2020-06-26: 40 ug/min via INTRAVENOUS

## 2020-06-26 MED ORDER — POVIDONE-IODINE 10 % EX SWAB
2.0000 "application " | Freq: Once | CUTANEOUS | Status: AC
  Administered 2020-06-26: 2 via TOPICAL

## 2020-06-26 MED ORDER — LIDOCAINE 2% (20 MG/ML) 5 ML SYRINGE
INTRAMUSCULAR | Status: AC
  Filled 2020-06-26: qty 5

## 2020-06-26 MED ORDER — MAGNESIUM CITRATE PO SOLN: 1.0000 | Freq: Once | ORAL | Status: DC | PRN

## 2020-06-26 MED ORDER — AMISULPRIDE (ANTIEMETIC) 5 MG/2ML IV SOLN
INTRAVENOUS | Status: AC
  Filled 2020-06-26: qty 2

## 2020-06-26 MED ORDER — PHENYLEPHRINE HCL (PRESSORS) 10 MG/ML IV SOLN
INTRAVENOUS | Status: AC
  Filled 2020-06-26: qty 1

## 2020-06-26 MED ORDER — TRANEXAMIC ACID-NACL 1000-0.7 MG/100ML-% IV SOLN
INTRAVENOUS | Status: AC
  Filled 2020-06-26: qty 100

## 2020-06-26 MED ORDER — GABAPENTIN 300 MG PO CAPS
300.0000 mg | ORAL_CAPSULE | Freq: Two times a day (BID) | ORAL | Status: DC
  Administered 2020-06-26 – 2020-06-27 (×3): 300 mg via ORAL
  Filled 2020-06-26 (×3): qty 1

## 2020-06-26 MED ORDER — PHENOL 1.4 % MT LIQD: 1.0000 | OROMUCOSAL | Status: DC | PRN

## 2020-06-26 MED ORDER — BUPIVACAINE HCL (PF) 0.25 % IJ SOLN
INTRAMUSCULAR | Status: AC
  Filled 2020-06-26: qty 30

## 2020-06-26 MED ORDER — ACETAMINOPHEN 10 MG/ML IV SOLN
1000.0000 mg | Freq: Once | INTRAVENOUS | Status: DC | PRN
  Administered 2020-06-26: 1000 mg via INTRAVENOUS

## 2020-06-26 MED ORDER — LACTATED RINGERS IV SOLN: INTRAVENOUS | Status: DC

## 2020-06-26 MED ORDER — ORAL CARE MOUTH RINSE: 15.0000 mL | Freq: Once | OROMUCOSAL | Status: AC

## 2020-06-26 MED ORDER — OXYCODONE HCL 5 MG PO TABS
10.0000 mg | ORAL_TABLET | Freq: Once | ORAL | Status: AC | PRN
  Administered 2020-06-26: 10 mg via ORAL

## 2020-06-26 MED ORDER — PANTOPRAZOLE SODIUM 40 MG PO TBEC
40.0000 mg | DELAYED_RELEASE_TABLET | Freq: Two times a day (BID) | ORAL | Status: DC
  Administered 2020-06-26 – 2020-06-27 (×2): 40 mg via ORAL
  Filled 2020-06-26 (×2): qty 1

## 2020-06-26 MED ORDER — ACETAMINOPHEN 160 MG/5ML PO SOLN: 1000.0000 mg | Freq: Once | ORAL | Status: DC | PRN

## 2020-06-26 MED ORDER — POLYETHYLENE GLYCOL 3350 17 G PO PACK: 17.0000 g | PACK | Freq: Every day | ORAL | Status: DC | PRN

## 2020-06-26 MED ORDER — ACETAMINOPHEN 10 MG/ML IV SOLN
1000.0000 mg | Freq: Once | INTRAVENOUS | Status: AC
  Administered 2020-06-26: 1000 mg via INTRAVENOUS

## 2020-06-26 MED ORDER — HYDROCODONE-ACETAMINOPHEN 7.5-325 MG PO TABS: 1.0000 | ORAL_TABLET | ORAL | Status: DC | PRN

## 2020-06-26 MED ORDER — OXYCODONE HCL 5 MG/5ML PO SOLN: 5.0000 mg | Freq: Once | ORAL | Status: AC | PRN

## 2020-06-26 MED ORDER — LIDOCAINE 2% (20 MG/ML) 5 ML SYRINGE
INTRAMUSCULAR | Status: DC | PRN
  Administered 2020-06-26: 100 mg via INTRAVENOUS

## 2020-06-26 MED ORDER — SUGAMMADEX SODIUM 500 MG/5ML IV SOLN
INTRAVENOUS | Status: AC
  Filled 2020-06-26: qty 5

## 2020-06-26 MED ORDER — SCOPOLAMINE 1 MG/3DAYS TD PT72
MEDICATED_PATCH | TRANSDERMAL | Status: DC | PRN
  Administered 2020-06-26: 1 via TRANSDERMAL

## 2020-06-26 MED ORDER — FENTANYL CITRATE (PF) 100 MCG/2ML IJ SOLN
INTRAMUSCULAR | Status: AC
  Filled 2020-06-26: qty 2

## 2020-06-26 MED ORDER — PROPOFOL 10 MG/ML IV BOLUS
INTRAVENOUS | Status: DC | PRN
  Administered 2020-06-26: 20 mg via INTRAVENOUS
  Administered 2020-06-26: 140 mg via INTRAVENOUS

## 2020-06-26 MED ORDER — METHOCARBAMOL 500 MG PO TABS: 500.0000 mg | ORAL_TABLET | Freq: Four times a day (QID) | ORAL | Status: DC | PRN

## 2020-06-26 MED ORDER — HYDROCODONE-ACETAMINOPHEN 5-325 MG PO TABS
1.0000 | ORAL_TABLET | ORAL | Status: DC | PRN
  Administered 2020-06-26 – 2020-06-27 (×2): 2 via ORAL
  Filled 2020-06-26 (×2): qty 2

## 2020-06-26 MED ORDER — FENTANYL CITRATE (PF) 100 MCG/2ML IJ SOLN
25.0000 ug | INTRAMUSCULAR | Status: DC | PRN
  Administered 2020-06-26 (×3): 50 ug via INTRAVENOUS

## 2020-06-26 MED ORDER — TIZANIDINE HCL 4 MG PO TABS
4.0000 mg | ORAL_TABLET | Freq: Three times a day (TID) | ORAL | Status: DC
  Administered 2020-06-26 – 2020-06-27 (×3): 4 mg via ORAL
  Filled 2020-06-26 (×3): qty 1

## 2020-06-26 MED ORDER — MENTHOL 3 MG MT LOZG: 1.0000 | LOZENGE | OROMUCOSAL | Status: DC | PRN

## 2020-06-26 MED ORDER — DOCUSATE SODIUM 100 MG PO CAPS
100.0000 mg | ORAL_CAPSULE | Freq: Two times a day (BID) | ORAL | Status: DC
  Administered 2020-06-26 – 2020-06-27 (×2): 100 mg via ORAL
  Filled 2020-06-26 (×2): qty 1

## 2020-06-26 MED ORDER — MORPHINE SULFATE (PF) 2 MG/ML IV SOLN: 0.5000 mg | INTRAVENOUS | Status: DC | PRN

## 2020-06-26 MED ORDER — TRANEXAMIC ACID-NACL 1000-0.7 MG/100ML-% IV SOLN
1000.0000 mg | INTRAVENOUS | Status: AC
  Administered 2020-06-26: 1000 mg via INTRAVENOUS

## 2020-06-26 MED ORDER — TICAGRELOR 90 MG PO TABS
90.0000 mg | ORAL_TABLET | Freq: Two times a day (BID) | ORAL | Status: DC
  Administered 2020-06-27: 90 mg via ORAL
  Filled 2020-06-26: qty 1

## 2020-06-26 MED ORDER — PHENYLEPHRINE 40 MCG/ML (10ML) SYRINGE FOR IV PUSH (FOR BLOOD PRESSURE SUPPORT)
PREFILLED_SYRINGE | INTRAVENOUS | Status: DC | PRN
  Administered 2020-06-26 (×2): 80 ug via INTRAVENOUS

## 2020-06-26 MED ORDER — DULOXETINE HCL 30 MG PO CPEP
30.0000 mg | ORAL_CAPSULE | Freq: Every day | ORAL | Status: DC
  Administered 2020-06-27: 30 mg via ORAL
  Filled 2020-06-26: qty 1

## 2020-06-26 MED ORDER — OXYCODONE HCL 5 MG/5ML PO SOLN: 5.0000 mg | Freq: Once | ORAL | Status: DC | PRN

## 2020-06-26 MED ORDER — HYDROMORPHONE HCL 1 MG/ML IJ SOLN
0.2500 mg | INTRAMUSCULAR | Status: DC | PRN
  Administered 2020-06-26 (×3): 0.5 mg via INTRAVENOUS

## 2020-06-26 MED ORDER — TAMSULOSIN HCL 0.4 MG PO CAPS: 0.4000 mg | ORAL_CAPSULE | Freq: Every day | ORAL | Status: DC

## 2020-06-26 MED ORDER — DEXAMETHASONE SODIUM PHOSPHATE 10 MG/ML IJ SOLN
10.0000 mg | Freq: Once | INTRAMUSCULAR | Status: AC
  Administered 2020-06-27: 10 mg via INTRAVENOUS
  Filled 2020-06-26: qty 1

## 2020-06-26 MED ORDER — 0.9 % SODIUM CHLORIDE (POUR BTL) OPTIME
TOPICAL | Status: DC | PRN
  Administered 2020-06-26: 1000 mL

## 2020-06-26 MED ORDER — CHLORHEXIDINE GLUCONATE 0.12 % MT SOLN
15.0000 mL | Freq: Once | OROMUCOSAL | Status: AC
  Administered 2020-06-26: 15 mL via OROMUCOSAL

## 2020-06-26 MED ORDER — METOCLOPRAMIDE HCL 5 MG PO TABS: 5.0000 mg | ORAL_TABLET | Freq: Three times a day (TID) | ORAL | Status: DC | PRN

## 2020-06-26 SURGICAL SUPPLY — 48 items
ARTICULEZE HEAD (Hips) ×3 IMPLANT
BAG DECANTER FOR FLEXI CONT (MISCELLANEOUS) IMPLANT
BAG ZIPLOCK 12X15 (MISCELLANEOUS) IMPLANT
BLADE SAG 18X100X1.27 (BLADE) ×3 IMPLANT
CLOSURE WOUND 1/2 X4 (GAUZE/BANDAGES/DRESSINGS) ×2
COVER PERINEAL POST (MISCELLANEOUS) ×3 IMPLANT
COVER SURGICAL LIGHT HANDLE (MISCELLANEOUS) ×3 IMPLANT
COVER WAND RF STERILE (DRAPES) IMPLANT
CUP ACETBLR 54 OD PINNACLE (Hips) ×3 IMPLANT
DECANTER SPIKE VIAL GLASS SM (MISCELLANEOUS) ×3 IMPLANT
DRAPE STERI IOBAN 125X83 (DRAPES) ×3 IMPLANT
DRAPE U-SHAPE 47X51 STRL (DRAPES) ×6 IMPLANT
DRESSING AQUACEL AG SP 3.5X10 (GAUZE/BANDAGES/DRESSINGS) ×1 IMPLANT
DRSG ADAPTIC 3X8 NADH LF (GAUZE/BANDAGES/DRESSINGS) ×3 IMPLANT
DRSG AQUACEL AG ADV 3.5X10 (GAUZE/BANDAGES/DRESSINGS) ×3 IMPLANT
DRSG AQUACEL AG SP 3.5X10 (GAUZE/BANDAGES/DRESSINGS) ×3
DURAPREP 26ML APPLICATOR (WOUND CARE) ×3 IMPLANT
ELECT REM PT RETURN 15FT ADLT (MISCELLANEOUS) ×3 IMPLANT
EVACUATOR 1/8 PVC DRAIN (DRAIN) IMPLANT
GLOVE BIO SURGEON STRL SZ 6 (GLOVE) ×3 IMPLANT
GLOVE BIO SURGEON STRL SZ7 (GLOVE) IMPLANT
GLOVE BIO SURGEON STRL SZ8 (GLOVE) ×3 IMPLANT
GLOVE BIOGEL PI IND STRL 6.5 (GLOVE) ×1 IMPLANT
GLOVE BIOGEL PI IND STRL 7.0 (GLOVE) IMPLANT
GLOVE BIOGEL PI IND STRL 8 (GLOVE) ×1 IMPLANT
GLOVE BIOGEL PI INDICATOR 6.5 (GLOVE) ×2
GLOVE BIOGEL PI INDICATOR 7.0 (GLOVE)
GLOVE BIOGEL PI INDICATOR 8 (GLOVE) ×2
GOWN STRL REUS W/TWL LRG LVL3 (GOWN DISPOSABLE) ×3 IMPLANT
GOWN STRL REUS W/TWL XL LVL3 (GOWN DISPOSABLE) IMPLANT
HEAD ARTICULEZE (Hips) ×1 IMPLANT
HOLDER FOLEY CATH W/STRAP (MISCELLANEOUS) IMPLANT
KIT TURNOVER KIT A (KITS) IMPLANT
LINER MARATHON NEUT +4X54X36 (Hips) ×3 IMPLANT
MANIFOLD NEPTUNE II (INSTRUMENTS) ×3 IMPLANT
PACK ANTERIOR HIP CUSTOM (KITS) ×3 IMPLANT
PENCIL SMOKE EVACUATOR COATED (MISCELLANEOUS) ×3 IMPLANT
STEM FEM ACTIS HIGH SZ7 (Stem) ×3 IMPLANT
STRIP CLOSURE SKIN 1/2X4 (GAUZE/BANDAGES/DRESSINGS) ×4 IMPLANT
SUT ETHIBOND NAB CT1 #1 30IN (SUTURE) ×3 IMPLANT
SUT MNCRL AB 4-0 PS2 18 (SUTURE) ×3 IMPLANT
SUT STRATAFIX 0 PDS 27 VIOLET (SUTURE) ×3
SUT VIC AB 2-0 CT1 27 (SUTURE) ×4
SUT VIC AB 2-0 CT1 TAPERPNT 27 (SUTURE) ×2 IMPLANT
SUTURE STRATFX 0 PDS 27 VIOLET (SUTURE) ×1 IMPLANT
SYR 50ML LL SCALE MARK (SYRINGE) IMPLANT
SYR BULB IRRIG 60ML STRL (SYRINGE) ×3 IMPLANT
TRAY FOLEY MTR SLVR 16FR STAT (SET/KITS/TRAYS/PACK) IMPLANT

## 2020-06-26 NOTE — Transfer of Care (Signed)
Immediate Anesthesia Transfer of Care Note  Patient: Bruce Stewart  Procedure(s) Performed: TOTAL HIP ARTHROPLASTY ANTERIOR APPROACH (Left Hip)  Patient Location: PACU  Anesthesia Type:General  Level of Consciousness: drowsy and patient cooperative  Airway & Oxygen Therapy: Patient Spontanous Breathing and Patient connected to face mask oxygen  Post-op Assessment: Report given to RN and Post -op Vital signs reviewed and stable  Post vital signs: Reviewed and stable  Last Vitals:  Vitals Value Taken Time  BP 151/71 06/26/20 1255  Temp    Pulse 83 06/26/20 1256  Resp 18 06/26/20 1256  SpO2 100 % 06/26/20 1256  Vitals shown include unvalidated device data.  Last Pain:  Vitals:   06/26/20 0855  TempSrc:   PainSc: 0-No pain         Complications: No complications documented.

## 2020-06-26 NOTE — Op Note (Signed)
OPERATIVE REPORT- TOTAL HIP ARTHROPLASTY   PREOPERATIVE DIAGNOSIS: Osteoarthritis of the Left hip.   POSTOPERATIVE DIAGNOSIS: Osteoarthritis of the Left  hip.   PROCEDURE: Left total hip arthroplasty, anterior approach.   SURGEON: Gaynelle Arabian, MD   ASSISTANT: Griffith Citron, PA-C  ANESTHESIA:  General  ESTIMATED BLOOD LOSS:-450 mL    DRAINS: Hemovac x1.   COMPLICATIONS: None   CONDITION: PACU - hemodynamically stable.   BRIEF CLINICAL NOTE: Bruce Stewart is a 74 y.o. male who has advanced end-  stage arthritis of their Left  hip with progressively worsening pain and  dysfunction.The patient has failed nonoperative management and presents for  total hip arthroplasty.   PROCEDURE IN DETAIL: After successful administration of spinal  anesthetic, the traction boots for the [facility] bed were placed on both  feet and the patient was placed onto the [facility] bed, boots placed into the leg  holders. The Left hip was then isolated from the perineum with plastic  drapes and prepped and draped in the usual sterile fashion. ASIS and  greater trochanter were marked and a oblique incision was made, starting  at about 1 cm lateral and 2 cm distal to the ASIS and coursing towards  the anterior cortex of the femur. The skin was cut with a 10 blade  through subcutaneous tissue to the level of the fascia overlying the  tensor fascia lata muscle. The fascia was then incised in line with the  incision at the junction of the anterior third and posterior 2/3rd. The  muscle was teased off the fascia and then the interval between the TFL  and the rectus was developed. The Hohmann retractor was then placed at  the top of the femoral neck over the capsule. The vessels overlying the  capsule were cauterized and the fat on top of the capsule was removed.  A Hohmann retractor was then placed anterior underneath the rectus  femoris to give exposure to the entire anterior capsule. A T-shaped   capsulotomy was performed. The edges were tagged and the femoral head  was identified.       Osteophytes are removed off the superior acetabulum.  The femoral neck was then cut in situ with an oscillating saw. Traction  was then applied to the left lower extremity utilizing the [facility]  traction. The femoral head was then removed. Retractors were placed  around the acetabulum and then circumferential removal of the labrum was  performed. Osteophytes were also removed. Reaming starts at 49 mm to  medialize and  Increased in 2 mm increments to 53 mm. We reamed in  approximately 40 degrees of abduction, 20 degrees anteversion. A 54 mm  pinnacle acetabular shell was then impacted in anatomic position under  fluoroscopic guidance with excellent purchase. We did not need to place  any additional dome screws. A 36 mm neutral + 4 marathon liner was then  placed into the acetabular shell.       The femoral lift was then placed along the lateral aspect of the femur  just distal to the vastus ridge. The leg was  externally rotated and capsule  was stripped off the inferior aspect of the femoral neck down to the  level of the lesser trochanter, this was done with electrocautery. The femur was lifted after this was performed. The  leg was then placed in an extended and adducted position essentially delivering the femur. We also removed the capsule superiorly and the piriformis from the piriformis  fossa to gain excellent exposure of the  proximal femur. Rongeur was used to remove some cancellous bone to get  into the lateral portion of the proximal femur for placement of the  initial starter reamer. The starter broaches was placed  the starter broach  and was shown to go down the center of the canal. Broaching  with the Actis system was then performed starting at size 0  coursing  Up to size 7. A size 7 had excellent torsional and rotational  and axial stability. The trial high offset neck was then placed   with a 36 + 5 trial head. The hip was then reduced. We confirmed that  the stem was in the canal both on AP and lateral x-rays. It also has excellent sizing. The hip was reduced with outstanding stability through full extension and full external rotation.. AP pelvis was taken and the leg lengths were measured and found to be equal. Hip was then dislocated again and the femoral head and neck removed. The  femoral broach was removed. Size 6 Actis stem with a high offset  neck was then impacted into the femur following native anteversion. Has  excellent purchase in the canal. Excellent torsional and rotational and  axial stability. It is confirmed to be in the canal on AP and lateral  fluoroscopic views. The 36 + 5 metal head was placed and the hip  reduced with outstanding stability. Again AP pelvis was taken and it  confirmed that the leg lengths were equal. The wound was then copiously  irrigated with saline solution and the capsule reattached and repaired  with Ethibond suture. 30 ml of .25% Bupivicaine was  injected into the capsule and into the edge of the tensor fascia lata as well as subcutaneous tissue. The fascia overlying the tensor fascia lata was then closed with a running #1 V-Loc. Subcu was closed with interrupted 2-0 Vicryl and subcuticular running 4-0 Monocryl. Incision was cleaned  and dried. Steri-Strips and a bulky sterile dressing applied. Hemovac  drain was hooked to suction and then the patient was awakened and transported to  recovery in stable condition.        Please note that a surgical assistant was a medical necessity for this procedure to perform it in a safe and expeditious manner. Assistant was necessary to provide appropriate retraction of vital neurovascular structures and to prevent femoral fracture and allow for anatomic placement of the prosthesis.  Gaynelle Arabian, M.D.

## 2020-06-26 NOTE — Progress Notes (Signed)
Orthopedic Tech Progress Note Patient Details:  Bruce Stewart 1945/12/07 [id number]  Ortho Devices Ortho Device/Splint Location: Trapeze bar Ortho Device/Splint Interventions: Application   Post Interventions Patient Tolerated: Well Instructions Provided: Care of device   [place] Pink 06/26/2020, 3:26 PM

## 2020-06-26 NOTE — Discharge Instructions (Addendum)
Gaynelle Arabian, MD Total Joint Specialist [facility] [address] [place], [place] [address] [phone]  ANTERIOR APPROACH TOTAL HIP REPLACEMENT POSTOPERATIVE DIRECTIONS     Hip Rehabilitation, Guidelines Following Surgery  The results of a hip operation are greatly improved after range of motion and muscle strengthening exercises. Follow all safety measures which are given to protect your hip. If any of these exercises cause increased pain or swelling in your joint, decrease the amount until you are comfortable again. Then slowly increase the exercises. Call your caregiver if you have problems or questions.   BLOOD CLOT PREVENTION . Take a 325 mg Aspirin two times a day for three weeks following surgery with your usual Brilinta dose. Then resume one 81 mg Aspirin once a day with your usual Brilinta dose. Dennis Bast may resume your vitamins/supplements upon discharge from the hospital.  [place]  . Remove items at home which could result in a fall. This includes throw rugs or furniture in walking pathways.   ICE to the affected hip as frequently as 20-30 minutes an hour and then as needed for pain and swelling. Continue to use ice on the hip for pain and swelling from surgery. You may notice swelling that will progress down to the foot and ankle. This is normal after surgery. Elevate the leg when you are not up walking on it.    Continue to use the breathing machine which will help keep your temperature down.  It is common for your temperature to cycle up and down following surgery, especially at night when you are not up moving around and exerting yourself.  The breathing machine keeps your lungs expanded and your temperature down.  DIET You may resume your previous home diet once your are discharged from the hospital.  DRESSING / WOUND CARE / SHOWERING . You have an adhesive waterproof bandage over the incision. Leave this in place until your  first follow-up appointment. Once you remove this you will not need to place another bandage.  . You may begin showering 3 days following surgery, but do not submerge the incision under water.  ACTIVITY . For the first 3-5 days, it is important to rest and keep the operative leg elevated. You should, as a general rule, rest for 50 minutes and walk/stretch for 10 minutes per hour. After 5 days, you may slowly increase activity as tolerated.  Marland Kitchen Perform the exercises you were provided twice a day for about 15-20 minutes each session. Begin these 2 days following surgery. . Walk with your walker as instructed. Use the walker until you are comfortable transitioning to a cane. Walk with the cane in the opposite hand of the operative leg. You may discontinue the cane once you are comfortable and walking steadily. . Avoid periods of inactivity such as sitting longer than an hour when not asleep. This helps prevent blood clots.  . Do not drive a car for 6 weeks or until released by your surgeon.  . Do not drive while taking narcotics.  TED HOSE STOCKINGS Wear the elastic stockings on both legs for three weeks following surgery during the day. You may remove them at night while sleeping.  WEIGHT BEARING Weight bearing as tolerated with assist device (walker, cane, etc) as directed, use it as long as suggested by your surgeon or therapist, typically at least 4-6 weeks.  POSTOPERATIVE CONSTIPATION PROTOCOL Constipation - defined medically as fewer than three stools per week and severe constipation as less  than one stool per week.  One of the most common issues patients have following surgery is constipation.  Even if you have a regular bowel pattern at home, your normal regimen is likely to be disrupted due to multiple reasons following surgery.  Combination of anesthesia, postoperative narcotics, change in appetite and fluid intake all can affect your bowels.  In order to avoid complications following  surgery, here are some recommendations in order to help you during your recovery period.  . Colace (docusate) - Pick up an over-the-counter form of Colace or another stool softener and take twice a day as long as you are requiring postoperative pain medications.  Take with a full glass of water daily.  If you experience loose stools or diarrhea, hold the colace until you stool forms back up.  If your symptoms do not get better within 1 week or if they get worse, check with your doctor. . Dulcolax (bisacodyl) - Pick up over-the-counter and take as directed by the product packaging as needed to assist with the movement of your bowels.  Take with a full glass of water.  Use this product as needed if not relieved by Colace only.  . MiraLax (polyethylene glycol) - Pick up over-the-counter to have on hand.  MiraLax is a solution that will increase the amount of water in your bowels to assist with bowel movements.  Take as directed and can mix with a glass of water, juice, soda, coffee, or tea.  Take if you go more than two days without a movement.Do not use MiraLax more than once per day. Call your doctor if you are still constipated or irregular after using this medication for 7 days in a row.  If you continue to have problems with postoperative constipation, please contact the office for further assistance and recommendations.  If you experience "the worst abdominal pain ever" or develop nausea or vomiting, please contact the office immediatly for further recommendations for treatment.  ITCHING  If you experience itching with your medications, try taking only a single pain pill, or even half a pain pill at a time.  You can also use Benadryl over the counter for itching or also to help with sleep.   MEDICATIONS See your medication summary on the "After Visit Summary" that the nursing staff will review with you prior to discharge.  You may have some home medications which will be placed on hold until you  complete the course of blood thinner medication.  It is important for you to complete the blood thinner medication as prescribed by your surgeon.  Continue your approved medications as instructed at time of discharge.  PRECAUTIONS If you experience chest pain or shortness of breath - call 911 immediately for transfer to the hospital emergency department.  If you develop a fever greater that 101 F, purulent drainage from wound, increased redness or drainage from wound, foul odor from the wound/dressing, or calf pain - CONTACT YOUR SURGEON.                                                   FOLLOW-UP APPOINTMENTS Make sure you keep all of your appointments after your operation with your surgeon and caregivers. You should call the office at the above phone number and make an appointment for approximately two weeks after the date of your surgery or  on the date instructed by your surgeon outlined in the "After Visit Summary".  RANGE OF MOTION AND STRENGTHENING EXERCISES  These exercises are designed to help you keep full movement of your hip joint. Follow your caregiver's or physical therapist's instructions. Perform all exercises about fifteen times, three times per day or as directed. Exercise both hips, even if you have had only one joint replacement. These exercises can be done on a training (exercise) mat, on the floor, on a table or on a bed. Use whatever works the best and is most comfortable for you. Use music or television while you are exercising so that the exercises are a pleasant break in your day. This will make your life better with the exercises acting as a break in routine you can look forward to.  . Lying on your back, slowly slide your foot toward your buttocks, raising your knee up off the floor. Then slowly slide your foot back down until your leg is straight again.  . Lying on your back spread your legs as far apart as you can without causing discomfort.  . Lying on your side, raise your  upper leg and foot straight up from the floor as far as is comfortable. Slowly lower the leg and repeat.  . Lying on your back, tighten up the muscle in the front of your thigh (quadriceps muscles). You can do this by keeping your leg straight and trying to raise your heel off the floor. This helps strengthen the largest muscle supporting your knee.  . Lying on your back, tighten up the muscles of your buttocks both with the legs straight and with the knee bent at a comfortable angle while keeping your heel on the floor.   IF YOU ARE TRANSFERRED TO A SKILLED REHAB FACILITY If the patient is transferred to a skilled rehab facility following release from the hospital, a list of the current medications will be sent to the facility for the patient to continue.  When discharged from the skilled rehab facility, please have the facility set up the patient's [place] prior to being released. Also, the skilled facility will be responsible for providing the patient with their medications at time of release from the facility to include their pain medication, the muscle relaxants, and their blood thinner medication. If the patient is still at the rehab facility at time of the two week follow up appointment, the skilled rehab facility will also need to assist the patient in arranging follow up appointment in our office and any transportation needs.  MAKE SURE YOU:  . Understand these instructions.  . Get help right away if you are not doing well or get worse.    DENTAL ANTIBIOTICS:  In most cases prophylactic antibiotics for Dental procdeures after total joint surgery are not necessary.  Exceptions are as follows:  1. History of prior total joint infection  2. Severely immunocompromised (Organ Transplant, cancer chemotherapy, Rheumatoid biologic meds such as [place])  3. Poorly controlled diabetes (A1C &gt; 8.0, blood glucose over 200)  If you have one of these conditions, contact your  surgeon for an antibiotic prescription, prior to your dental procedure.    Pick up stool softner and laxative for home use following surgery while on pain medications. Do not submerge incision under water. Please use good hand washing techniques while changing dressing each day. May shower starting three days after surgery. Please use a clean towel to pat the incision dry following showers. Continue to use  ice for pain and swelling after surgery. Do not use any lotions or creams on the incision until instructed by your surgeon.

## 2020-06-26 NOTE — Evaluation (Signed)
Physical Therapy Evaluation Patient Details Name: Bruce Stewart MRN: [id number] DOB: 04/18/1946 Today's Date: 06/26/2020   History of Present Illness  Patient is 74 y.o. male s/p Lt THA anterior approach on 06/26/20 with PMH significant for MI, CAD, OA, anemia, back surgery.    Clinical Impression  Bruce Stewart is a 74 y.o. male POD 0 s/p Lt THA. Patient reports independence with mobility at baseline. Patient is now limited by functional impairments (see PT problem list below) and requires min assist for transfers and gait with RW. Patient was able to ambulate ~60 feet with RW and min assist. Patient instructed in exercise to facilitate circulation. Patient will benefit from continued skilled PT interventions to address impairments and progress towards PLOF. Acute PT will follow to progress mobility and stair training in preparation for safe discharge home.     Follow Up Recommendations Follow surgeon's recommendation for DC plan and follow-up therapies    Equipment Recommendations  Rolling walker with 5" wheels    Recommendations for Other Services       Precautions / Restrictions Precautions Precautions: Fall      Mobility  Bed Mobility Overal bed mobility: Needs Assistance Bed Mobility: Supine to Sit     Supine to sit: Min assist;HOB elevated     General bed mobility comments: cues to use bed rail, light assist for raising trunk upright.  Transfers Overall transfer level: Needs assistance Equipment used: Rolling walker (2 wheeled) Transfers: Sit to/from Stand Sit to Stand: Min assist;From elevated surface         General transfer comment: cues for technique with RW, light assist for power up and rise.  Ambulation/Gait Ambulation/Gait assistance: Min assist Gait Distance (Feet): 60 Feet Assistive device: Rolling walker (2 wheeled) Gait Pattern/deviations: Step-to pattern;Decreased stride length;Decreased weight shift to left Gait velocity: decr    General Gait Details: cues for step pattern and proximity to RW, assist to manage walker. no overt LOB noted.  Stairs            Wheelchair Mobility    Modified Rankin (Stroke Patients Only)       Balance Overall balance assessment: Needs assistance Sitting-balance support: Feet supported Sitting balance-Leahy Scale: Good     Standing balance support: During functional activity;Bilateral upper extremity supported Standing balance-Leahy Scale: Fair          Pertinent Vitals/Pain Pain Assessment: 0-10 Pain Score: 2  Pain Location: Lt hip Pain Descriptors / Indicators: Aching;Discomfort Pain Intervention(s): Limited activity within patient's tolerance;Monitored during session;Repositioned    Home Living Family/patient expects to be discharged to:: Private residence Living Arrangements: Spouse/significant other Available Help at Discharge: Family Type of Home: House Home Access: Stairs to enter Entrance Stairs-Rails: Right Entrance Stairs-Number of Steps: 3 Home Layout: One level Home Equipment: Toilet riser;Grab bars - toilet;Grab bars - tub/shower;Walker - 4 wheels;Walker - standard      Prior Function Level of Independence: Independent               Hand Dominance   Dominant Hand: Right    Extremity/Trunk Assessment   Upper Extremity Assessment Upper Extremity Assessment: Overall WFL for tasks assessed    Lower Extremity Assessment Lower Extremity Assessment: Overall WFL for tasks assessed    Cervical / Trunk Assessment Cervical / Trunk Assessment: Normal  Communication   Communication: No difficulties  Cognition Arousal/Alertness: Awake/alert Behavior During Therapy: WFL for tasks assessed/performed Overall Cognitive Status: Within Functional Limits for tasks assessed  General Comments      Exercises Total Joint Exercises Ankle Circles/Pumps: AROM;Both;20 reps;Seated   Assessment/Plan    PT Assessment Patient  needs continued PT services  PT Problem List Decreased strength;Decreased range of motion;Decreased activity tolerance;Decreased balance;Decreased mobility;Decreased knowledge of use of DME;Decreased knowledge of precautions       PT Treatment Interventions DME instruction;Gait training;Stair training;Functional mobility training;Therapeutic activities;Therapeutic exercise;Balance training;Patient/family education    PT Goals (Current goals can be found in the Care Plan section)  Acute Rehab PT Goals Patient Stated Goal: get home and independent PT Goal Formulation: With patient Time For Goal Achievement: 07/03/20 Potential to Achieve Goals: Good    Frequency 7X/week   Barriers to discharge           AM-PAC PT "6 Clicks" Mobility  Outcome Measure Help needed turning from your back to your side while in a flat bed without using bedrails?: A Little Help needed moving from lying on your back to sitting on the side of a flat bed without using bedrails?: A Little Help needed moving to and from a bed to a chair (including a wheelchair)?: A Little Help needed standing up from a chair using your arms (e.g., wheelchair or bedside chair)?: A Little Help needed to walk in hospital room?: A Little Help needed climbing 3-5 steps with a railing? : A Little 6 Click Score: 18    End of Session Equipment Utilized During Treatment: Gait belt Activity Tolerance: Patient tolerated treatment well Patient left: in chair;with call bell/phone within reach;with chair alarm set;with family/visitor present Nurse Communication: Mobility status PT Visit Diagnosis: Muscle weakness (generalized) (M62.81);Difficulty in walking, not elsewhere classified (R26.2)    Time: 4332-9518 PT Time Calculation (min) (ACUTE ONLY): 19 min   Charges:   PT Evaluation $PT Eval Low Complexity: 1 Low          Verner Mould, DPT Acute Rehabilitation Services  Office [phone] Pager [phone]  06/26/2020 6:04  PM

## 2020-06-26 NOTE — Plan of Care (Signed)
  Problem: Education: Goal: Knowledge of General Education information will improve Description Including pain rating scale, medication(s)/side effects and non-pharmacologic comfort measures Outcome: Progressing   Problem: Nutrition: Goal: Adequate nutrition will be maintained Outcome: Progressing   Problem: Pain Managment: Goal: General experience of comfort will improve Outcome: Progressing   

## 2020-06-26 NOTE — Interval H&P Note (Signed)
History and Physical Interval Note:  06/26/2020 8:55 AM  Bruce Stewart  has presented today for surgery, with the diagnosis of left hip osteoarthritis.  The various methods of treatment have been discussed with the patient and family. After consideration of risks, benefits and other options for treatment, the patient has consented to  Procedure(s) with comments: [place] (Left) - 162min as a surgical intervention.  The patient's history has been reviewed, patient examined, no change in status, stable for surgery.  I have reviewed the patient's chart and labs.  Questions were answered to the patient's satisfaction.     [name]

## 2020-06-26 NOTE — Anesthesia Procedure Notes (Signed)
Procedure Name: Intubation Date/Time: 06/26/2020 10:55 AM Performed by: Montel Clock, CRNA Pre-anesthesia Checklist: Patient identified, Emergency Drugs available, Suction available, Patient being monitored and Timeout performed Patient Re-evaluated:Patient Re-evaluated prior to induction Oxygen Delivery Method: Circle system utilized Preoxygenation: Pre-oxygenation with 100% oxygen Induction Type: IV induction Ventilation: Mask ventilation without difficulty and Oral airway inserted - appropriate to patient size Laryngoscope Size: Mac and 4 Grade View: Grade II Tube type: Oral Tube size: 7.5 mm Number of attempts: 1 Airway Equipment and Method: Stylet Placement Confirmation: ETT inserted through vocal cords under direct vision,  positive ETCO2 and breath sounds checked- equal and bilateral Secured at: 23 cm Tube secured with: Tape Dental Injury: Teeth and Oropharynx as per pre-operative assessment

## 2020-06-27 ENCOUNTER — Encounter (HOSPITAL_COMMUNITY): Payer: Self-pay | Admitting: Orthopedic Surgery

## 2020-06-27 DIAGNOSIS — Z79899 Other long term (current) drug therapy: Secondary | ICD-10-CM | POA: Diagnosis not present

## 2020-06-27 DIAGNOSIS — Z955 Presence of coronary angioplasty implant and graft: Secondary | ICD-10-CM | POA: Diagnosis not present

## 2020-06-27 DIAGNOSIS — M1612 Unilateral primary osteoarthritis, left hip: Secondary | ICD-10-CM | POA: Diagnosis not present

## 2020-06-27 DIAGNOSIS — I252 Old myocardial infarction: Secondary | ICD-10-CM | POA: Diagnosis not present

## 2020-06-27 DIAGNOSIS — I2510 Atherosclerotic heart disease of native coronary artery without angina pectoris: Secondary | ICD-10-CM | POA: Diagnosis not present

## 2020-06-27 DIAGNOSIS — Z7982 Long term (current) use of aspirin: Secondary | ICD-10-CM | POA: Diagnosis not present

## 2020-06-27 LAB — BASIC METABOLIC PANEL
Anion gap: 12 (ref 5–15)
BUN: 18 mg/dL (ref 8–23)
CO2: 24 mmol/L (ref 22–32)
Calcium: 8.2 mg/dL — ABNORMAL LOW (ref 8.9–10.3)
Chloride: 96 mmol/L — ABNORMAL LOW (ref 98–111)
Creatinine, Ser: 1.88 mg/dL — ABNORMAL HIGH (ref 0.61–1.24)
GFR calc Af Amer: 40 mL/min — ABNORMAL LOW (ref >60)
GFR calc non Af Amer: 34 mL/min — ABNORMAL LOW (ref >60)
Glucose, Bld: 153 mg/dL — ABNORMAL HIGH (ref 70–99)
Potassium: 4.8 mmol/L (ref 3.5–5.1)
Sodium: 132 mmol/L — ABNORMAL LOW (ref 135–145)

## 2020-06-27 LAB — CBC
HCT: 33.2 % — ABNORMAL LOW (ref 39.0–52.0)
Hemoglobin: 10.6 g/dL — ABNORMAL LOW (ref 13.0–17.0)
MCH: 31.2 pg (ref 26.0–34.0)
MCHC: 31.9 g/dL (ref 30.0–36.0)
MCV: 97.6 fL (ref 80.0–100.0)
Platelets: 207 10*3/uL (ref 150–400)
RBC: 3.4 MIL/uL — ABNORMAL LOW (ref 4.22–5.81)
RDW: 13.1 % (ref 11.5–15.5)
WBC: 10.9 10*3/uL — ABNORMAL HIGH (ref 4.0–10.5)
nRBC: 0 % (ref 0.0–0.2)

## 2020-06-27 MED ORDER — HYDROCODONE-ACETAMINOPHEN 5-325 MG PO TABS
1.0000 | ORAL_TABLET | Freq: Four times a day (QID) | ORAL | 0 refills | Status: DC | PRN
Start: 2020-06-27 — End: 2020-06-27

## 2020-06-27 MED ORDER — METHOCARBAMOL 500 MG PO TABS: 500.0000 mg | ORAL_TABLET | Freq: Four times a day (QID) | ORAL | 0 refills | Status: DC | PRN

## 2020-06-27 MED ORDER — HYDROCODONE-ACETAMINOPHEN 5-325 MG PO TABS: 1.0000 | ORAL_TABLET | Freq: Four times a day (QID) | ORAL | 0 refills | Status: DC | PRN

## 2020-06-27 MED ORDER — ASPIRIN 325 MG PO TBEC: 325.0000 mg | DELAYED_RELEASE_TABLET | Freq: Two times a day (BID) | ORAL | 0 refills | Status: AC

## 2020-06-27 MED ORDER — ASPIRIN 325 MG PO TBEC: 325.0000 mg | DELAYED_RELEASE_TABLET | Freq: Two times a day (BID) | ORAL | 0 refills | Status: DC

## 2020-06-27 MED ORDER — BUPRENORPHINE 15 MCG/HR TD PTWK
1.0000 | MEDICATED_PATCH | TRANSDERMAL | 2 refills | Status: AC
Start: 2020-06-27 — End: 2020-09-19

## 2020-06-27 NOTE — Progress Notes (Signed)
Physical Therapy Treatment Patient Details Name: Bruce Stewart MRN: [id number] DOB: 11-27-1945 Today's Date: 06/27/2020    History of Present Illness Patient is 74 y.o. male s/p Lt THA anterior approach on 06/26/20 with PMH significant for MI, CAD, OA, anemia, back surgery.    PT Comments    POD # 1 am session Assisted OOB.  General bed mobility comments: demonstarted and instructed how to use belt strap to self assist LE.  Assisted with amb in hallway and practiced 3 stair steps. General stair comments: 50% VC's on proper sequencing and safety.  Returned to bed per pt request due to pain.  Pt will need another PT session to educate on HEP and repeat stair training.   Follow Up Recommendations  Follow surgeon's recommendation for DC plan and follow-up therapies (HEP)     Equipment Recommendations  Rolling walker with 5" wheels    Recommendations for Other Services       Precautions / Restrictions Precautions Precautions: Fall    Mobility  Bed Mobility Overal bed mobility: Needs Assistance Bed Mobility: Supine to Sit;Sit to Supine     Supine to sit: Supervision Sit to supine: Supervision   General bed mobility comments: demonstarted and instructed how to use belt strap to self assist LE  Transfers Overall transfer level: Needs assistance Equipment used: Rolling walker (2 wheeled) Transfers: Sit to/from Stand Sit to Stand: Supervision;Min guard         General transfer comment: cues for technique with RW, light assist for power up and rise.  Ambulation/Gait Ambulation/Gait assistance: Supervision;Min guard Gait Distance (Feet): 85 Feet Assistive device: Rolling walker (2 wheeled) Gait Pattern/deviations: Step-to pattern;Decreased stride length;Decreased weight shift to left Gait velocity: decr   General Gait Details: cues for step pattern and proximity to RW, assist to manage walker. no overt LOB noted.   Stairs Stairs: Yes Stairs assistance:  Supervision;Min guard Stair Management: One rail Right;Step to pattern;Sideways;Forwards Number of Stairs: 3 General stair comments: 50% VC's on proper sequencing and safety   Wheelchair Mobility    Modified Rankin (Stroke Patients Only)       Balance                                            Cognition Arousal/Alertness: Awake/alert Behavior During Therapy: WFL for tasks assessed/performed Overall Cognitive Status: Within Functional Limits for tasks assessed                                 General Comments: AxO x 3      Exercises      General Comments        Pertinent Vitals/Pain Pain Assessment: 0-10 Pain Score: 5  Pain Location: Lt hip Pain Descriptors / Indicators: Aching;Discomfort;Operative site guarding Pain Intervention(s): Monitored during session;Premedicated before session;Repositioned;Ice applied    Home Living                      Prior Function            PT Goals (current goals can now be found in the care plan section) Progress towards PT goals: Progressing toward goals    Frequency    7X/week      PT Plan Current plan remains appropriate    Co-evaluation  AM-PAC PT "6 Clicks" Mobility   Outcome Measure  Help needed turning from your back to your side while in a flat bed without using bedrails?: None Help needed moving from lying on your back to sitting on the side of a flat bed without using bedrails?: A Little Help needed moving to and from a bed to a chair (including a wheelchair)?: A Little Help needed standing up from a chair using your arms (e.g., wheelchair or bedside chair)?: A Little Help needed to walk in hospital room?: A Little Help needed climbing 3-5 steps with a railing? : A Little 6 Click Score: 19    End of Session Equipment Utilized During Treatment: Gait belt Activity Tolerance: Patient tolerated treatment well Patient left: in chair;with call  bell/phone within reach;with chair alarm set;with family/visitor present Nurse Communication: Mobility status PT Visit Diagnosis: Muscle weakness (generalized) (M62.81);Difficulty in walking, not elsewhere classified (R26.2)     Time: 3943-2003 PT Time Calculation (min) (ACUTE ONLY): 25 min  Charges:  $Gait Training: 8-22 mins $Therapeutic Activity: 8-22 mins                     Rica Koyanagi  PTA Acute  Rehabilitation Services Pager      [phone] Office      [phone]

## 2020-06-27 NOTE — Progress Notes (Signed)
   Subjective: 1 Day Post-Op Procedure(s) (LRB): TOTAL HIP ARTHROPLASTY ANTERIOR APPROACH (Left) Patient reports pain as mild.   Patient seen in rounds with Dr. Wynelle Link. Patient is well, and has had no acute complaints or problems. Voiding without difficulty, no issues overnight. Denies chest pain or SOB.  We will continue therapy today, ambulated 33' yesterday.  Objective: Vital signs in last 24 hours: Temp:  [97.6 F (36.4 C)-98.7 F (37.1 C)] 97.8 F (36.6 C) (08/26 0231) Pulse Rate:  [64-88] 66 (08/26 0231) Resp:  [11-17] 16 (08/26 0231) BP: (104-174)/(63-91) 134/66 (08/26 0231) SpO2:  [92 %-100 %] 96 % (08/26 0231) Weight:  [124.5 kg] 124.5 kg (08/25 0820)  Intake/Output from previous day:  Intake/Output Summary (Last 24 hours) at 06/27/2020 0736 Last data filed at 06/27/2020 0600 Gross per 24 hour  Intake 2725 ml  Output 850 ml  Net 1875 ml     Intake/Output this shift: No intake/output data recorded.  Labs: Recent Labs    06/27/20 0219  HGB 10.6*   Recent Labs    06/27/20 0219  WBC 10.9*  RBC 3.40*  HCT 33.2*  PLT 207   Recent Labs    06/27/20 0219  NA 132*  K 4.8  CL 96*  CO2 24  BUN 18  CREATININE 1.88*  GLUCOSE 153*  CALCIUM 8.2*   No results for input(s): LABPT, INR in the last 72 hours.  Exam: General - Patient is Alert and Oriented Extremity - Neurologically intact Neurovascular intact Sensation intact distally Dorsiflexion/Plantar flexion intact Dressing - dressing C/D/I Motor Function - intact, moving foot and toes well on exam.   Past Medical History:  Diagnosis Date  . Anal fissure   . Anemia   . Arthritis   . Complication of anesthesia   . Coronary artery disease   . Diverticulitis   . Family hx of colon cancer   . Kidney stone   . Myocardial infarction ([place]) 10/2019  . PONV (postoperative nausea and vomiting)     Assessment/Plan: 1 Day Post-Op Procedure(s) (LRB): TOTAL HIP ARTHROPLASTY ANTERIOR APPROACH  (Left) Principal Problem:   OA (osteoarthritis) of hip Active Problems:   Primary osteoarthritis of left hip  Estimated body mass index is 33.41 kg/m as calculated from the following:   Height as of this encounter: 6\' 4"  (1.93 m).   Weight as of this encounter: 124.5 kg. Advance diet Up with therapy D/C IV fluids  DVT Prophylaxis - Aspirin Weight bearing as tolerated. Continue therapy.  Plan is to go Home after hospital stay. Plan for discharge around lunchtime if cleared by PT with HEP. Follow-up in the office in 2 weeks.   The PDMP database was reviewed today (06/27/2020) prior to any opioid medications being prescribed to this patient.   Theresa Duty, PA-C Orthopedic Surgery [phone] 06/27/2020, 7:36 AM

## 2020-06-27 NOTE — Progress Notes (Signed)
Physical Therapy Treatment Patient Details Name: Bruce Stewart MRN: [id number] DOB: 1946/02/14 Today's Date: 06/27/2020    History of Present Illness Patient is 74 y.o. male s/p Lt THA anterior approach on 06/26/20 with PMH significant for MI, CAD, OA, anemia, back surgery.    PT Comments    POD # 1 pm session Assisted with amb in hallway, repeat practiced the stairs Then returned to room to perform some TE's following HEP handout.  Instructed on proper tech, freq as well as use of ICE.   Addressed all mobility questions, discussed appropriate activity, educated on use of ICE.  Pt ready for D/C to home.   Follow Up Recommendations  Follow surgeons recommendation for DC plan and follow-up therapies (HEP)     Equipment Recommendations  Rolling walker with 5" wheels    Recommendations for Other Services       Precautions / Restrictions Precautions Precautions: Fall    Mobility  Bed Mobility Overal bed mobility: Needs Assistance Bed Mobility: Supine to Sit;Sit to Supine     Supine to sit: Supervision Sit to supine: Supervision   General bed mobility comments: demonstarted and instructed how to use belt strap to self assist LE  Transfers Overall transfer level: Needs assistance Equipment used: Rolling walker (2 wheeled) Transfers: Sit to/from Stand Sit to Stand: Supervision;Min guard         General transfer comment: cues for technique with RW, light assist for power up and rise.  Ambulation/Gait Ambulation/Gait assistance: Supervision;Min guard Gait Distance (Feet): 85 Feet Assistive device: Rolling walker (2 wheeled) Gait Pattern/deviations: Step-to pattern;Decreased stride length;Decreased weight shift to left Gait velocity: decr   General Gait Details: cues for step pattern and proximity to RW, assist to manage walker. no overt LOB noted.   Stairs Stairs: Yes Stairs assistance: Supervision;Min guard Stair Management: One rail Right;Step to  pattern;Sideways;Forwards Number of Stairs: 3 General stair comments: 50% VC's on proper sequencing and safety   Wheelchair Mobility    Modified Rankin (Stroke Patients Only)       Balance                                            Cognition Arousal/Alertness: Awake/alert Behavior During Therapy: WFL for tasks assessed/performed Overall Cognitive Status: Within Functional Limits for tasks assessed                                 General Comments: AxO x 3      Exercises   Total Hip Replacement TE's following HEP Handout 10 reps ankle pumps 05 reps knee presses 05 reps heel slides 05 reps SAQ's 05 reps ABD Instructed how to use a belt loop to assist  Followed by ICE     General Comments        Pertinent Vitals/Pain Pain Assessment: 0-10 Pain Score: 5  Pain Location: Lt hip Pain Descriptors / Indicators: Aching;Discomfort;Operative site guarding Pain Intervention(s): Monitored during session;Premedicated before session;Repositioned;Ice applied    Home Living                      Prior Function            PT Goals (current goals can now be found in the care plan section) Progress towards PT goals: Progressing toward goals  Frequency    7X/week      PT Plan Current plan remains appropriate    Co-evaluation              AM-PAC PT "6 Clicks" Mobility   Outcome Measure  Help needed turning from your back to your side while in a flat bed without using bedrails?: None Help needed moving from lying on your back to sitting on the side of a flat bed without using bedrails?: A Little Help needed moving to and from a bed to a chair (including a wheelchair)?: A Little Help needed standing up from a chair using your arms (e.g., wheelchair or bedside chair)?: A Little Help needed to walk in hospital room?: A Little Help needed climbing 3-5 steps with a railing? : A Little 6 Click Score: 19    End of Session  Equipment Utilized During Treatment: Gait belt Activity Tolerance: Patient tolerated treatment well Patient left: in chair;with call bell/phone within reach;with chair alarm set;with family/visitor present Nurse Communication: Mobility status PT Visit Diagnosis: Muscle weakness (generalized) (M62.81);Difficulty in walking, not elsewhere classified (R26.2)     Time: 0071-2197 PT Time Calculation (min) (ACUTE ONLY): 23 min  Charges:  $Gait Training: 8-22 mins $Therapeutic Exercise: 8-22 mins                     Rica Koyanagi  PTA Acute  Rehabilitation Services Pager      [phone] Office      [phone]

## 2020-06-27 NOTE — Progress Notes (Signed)
Patient discharged to home w/ spouse. Given all belongings, instructions, equipment. Verbalized understanding of instructions. Escorted to pov via w/c.

## 2020-06-27 NOTE — TOC Transition Note (Addendum)
Transition of Care [facility]) - CM/SW Discharge Note   Patient Details  Name: Bruce Stewart MRN: [id number] Date of Birth: July 20, 1946  [facility] of [facility] CM/SW Contact:  Lia Hopping, [place] Phone Number: 06/27/2020, 9:32 AM   Clinical Narrative:    Therapy Plan: HEP RW delivered to the patient bedside by [employer]. Patient reports he has a bedside commode.    Final next level of care: Home/Self Care (HEP) Barriers to Discharge: Barriers Resolved   Patient Goals and CMS Choice     Choice offered to / list presented to : NA  Discharge Placement                       Discharge Plan and Services                DME Arranged: Walker rolling DME Agency: [employer] Date DME Agency Contacted: 06/27/20 Time DME Agency Contacted: [phone] Representative spoke with at DME Agency: Ovid Curd HH Arranged: NA [place] Agency: NA        Social Determinants of Health ([place]) Interventions     Readmission Risk Interventions No flowsheet data found.

## 2020-06-28 NOTE — Anesthesia Postprocedure Evaluation (Signed)
Anesthesia Post Note  Patient: Bruce Stewart  Procedure(s) Performed: TOTAL HIP ARTHROPLASTY ANTERIOR APPROACH (Left Hip)     Patient location during evaluation: PACU Anesthesia Type: General Level of consciousness: awake and patient cooperative Pain management: pain level controlled Vital Signs Assessment: post-procedure vital signs reviewed and stable Respiratory status: spontaneous breathing, nonlabored ventilation, respiratory function stable and patient connected to nasal cannula oxygen Cardiovascular status: blood pressure returned to baseline and stable Postop Assessment: no apparent nausea or vomiting Anesthetic complications: no   No complications documented.  Last Vitals:  Vitals:   06/27/20 0231 06/27/20 1029  BP: 134/66 129/63  Pulse: 66 73  Resp: 16 17  Temp: 36.6 C 37.1 C  SpO2: 96% 98%    Last Pain:  Vitals:   06/27/20 1038  TempSrc:   PainSc: 2                  [place]

## 2020-07-01 NOTE — Discharge Summary (Signed)
Physician Discharge Summary   Patient ID: BREVIN MCFADDEN MRN: [id number] DOB/AGE: 1946-08-10 74 y.o.  Admit date: 06/26/2020 Discharge date: 06/27/2020  Primary Diagnosis: Osteoarthritis, left hip   Admission Diagnoses:  Past Medical History:  Diagnosis Date   Anal fissure    Anemia    Arthritis    Complication of anesthesia    Coronary artery disease    Diverticulitis    Family hx of colon cancer    Kidney stone    Myocardial infarction ([place]) 10/2019   PONV (postoperative nausea and vomiting)    Discharge Diagnoses:   Principal Problem:   OA (osteoarthritis) of hip Active Problems:   Primary osteoarthritis of left hip  Estimated body mass index is 33.41 kg/m as calculated from the following:   Height as of this encounter: 6\' 4"  (1.93 m).   Weight as of this encounter: 124.5 kg.  Procedure:  Procedure(s) (LRB): TOTAL HIP ARTHROPLASTY ANTERIOR APPROACH (Left)   Consults: None  HPI: Bruce Stewart is a 74 y.o. male who has advanced end-stage arthritis of their Left  hip with progressively worsening pain and dysfunction.The patient has failed nonoperative management and presents for total hip arthroplasty.   Laboratory Data: Admission on 06/26/2020, Discharged on 06/27/2020  Component Date Value Ref Range Status   ABO/RH(D) 06/26/2020    Final                   Value:A POS Performed at [facility], [place] [address] [place], [place] [address]    WBC 06/27/2020 10.9* 4.0 - 10.5 K/uL Final   RBC 06/27/2020 3.40* 4.22 - 5.81 MIL/uL Final   Hemoglobin 06/27/2020 10.6* 13.0 - 17.0 g/dL Final   HCT 06/27/2020 33.2* 39 - 52 % Final   MCV 06/27/2020 97.6  80.0 - 100.0 fL Final   MCH 06/27/2020 31.2  26.0 - 34.0 pg Final   MCHC 06/27/2020 31.9  30.0 - 36.0 g/dL Final   RDW 06/27/2020 13.1  11.5 - 15.5 % Final   Platelets 06/27/2020 207  150 - 400 K/uL Final   nRBC 06/27/2020 0.0  0.0 - 0.2 % Final   Performed at [facility], [place] [address] [place], [place] [address]   Sodium 06/27/2020 132* 135 - 145 mmol/L Final   Potassium 06/27/2020 4.8  3.5 - 5.1 mmol/L Final   Chloride 06/27/2020 96* 98 - 111 mmol/L Final   CO2 06/27/2020 24  22 - 32 mmol/L Final   Glucose, Bld 06/27/2020 153* 70 - 99 mg/dL Final   Glucose reference range applies only to samples taken after fasting for at least 8 hours.   BUN 06/27/2020 18  8 - 23 mg/dL Final   Creatinine, Ser 06/27/2020 1.88* 0.61 - 1.24 mg/dL Final   Calcium 06/27/2020 8.2* 8.9 - 10.3 mg/dL Final   GFR calc non Af Amer 06/27/2020 34* >60 mL/min Final   GFR calc Af Amer 06/27/2020 40* >60 mL/min Final   Anion gap 06/27/2020 12  5 - 15 Final   Performed at [facility], [place] [address] [place], [place] [address]  Hospital Outpatient Visit on 06/24/2020  Component Date Value Ref Range Status   SARS Coronavirus 2 06/24/2020 NEGATIVE  NEGATIVE Final   Comment: (NOTE) SARS-CoV-2 target nucleic acids are NOT DETECTED.  The SARS-CoV-2 RNA is generally detectable in upper and lower respiratory specimens during the acute phase of infection. Negative results do not preclude SARS-CoV-2 infection, do not rule out  co-infections with other pathogens, and should not be used as the sole basis for treatment or other patient management decisions. Negative results must be combined with clinical observations, patient history, and epidemiological information. The expected result is Negative.  Fact Sheet for Patients: SugarRoll.be  Fact Sheet for Healthcare Providers: https://www.woods-mathews.com/  This test is not yet approved or cleared by the [place] FDA and  has been authorized for detection and/or diagnosis of SARS-CoV-2 by FDA under an Emergency Use Authorization (EUA). This EUA will remain  in effect (meaning this test can be used) for the duration of the COVID-19 declaration  under Se                          ction 564(b)(1) of the Act, 21 U.S.C. section 360bbb-3(b)(1), unless the authorization is terminated or revoked sooner.  Performed at [facility], [place] [address] [place], [place] [address]   Hospital Outpatient Visit on 06/18/2020  Component Date Value Ref Range Status   MRSA, PCR 06/18/2020 NEGATIVE  NEGATIVE Final   Staphylococcus aureus 06/18/2020 NEGATIVE  NEGATIVE Final   Comment: (NOTE) The Xpert SA Assay (FDA approved for NASAL specimens in patients 37 years of age and older), is one component of a comprehensive surveillance program. It is not intended to diagnose infection nor to guide or monitor treatment. Performed at [facility], [place] [address] [place], [place] [address]    aPTT 06/18/2020 30  24 - 36 seconds Final   Performed at [facility], [place] [address] [place], [place] [address]   WBC 06/18/2020 7.5  4.0 - 10.5 K/uL Final   RBC 06/18/2020 3.88* 4.22 - 5.81 MIL/uL Final   Hemoglobin 06/18/2020 12.1* 13.0 - 17.0 g/dL Final   HCT 06/18/2020 37.4* 39 - 52 % Final   MCV 06/18/2020 96.4  80.0 - 100.0 fL Final   MCH 06/18/2020 31.2  26.0 - 34.0 pg Final   MCHC 06/18/2020 32.4  30.0 - 36.0 g/dL Final   RDW 06/18/2020 13.6  11.5 - 15.5 % Final   Platelets 06/18/2020 232  150 - 400 K/uL Final   nRBC 06/18/2020 0.0  0.0 - 0.2 % Final   Performed at [facility], [place] [address], [place], [place] [address]   Sodium 06/18/2020 138  135 - 145 mmol/L Final   Potassium 06/18/2020 4.4  3.5 - 5.1 mmol/L Final   Chloride 06/18/2020 103  98 - 111 mmol/L Final   CO2 06/18/2020 28  22 - 32 mmol/L Final   Glucose, Bld 06/18/2020 88  70 - 99 mg/dL Final   Glucose reference range applies only to samples taken after fasting for at least 8 hours.   BUN 06/18/2020 21  8 - 23 mg/dL Final   Creatinine, Ser 06/18/2020 1.44* 0.61 - 1.24 mg/dL Final   Calcium 06/18/2020  8.8* 8.9 - 10.3 mg/dL Final   Total Protein 06/18/2020 6.1* 6.5 - 8.1 g/dL Final   Albumin 06/18/2020 3.8  3.5 - 5.0 g/dL Final   AST 06/18/2020 20  15 - 41 U/L Final   ALT 06/18/2020 17  0 - 44 U/L Final   Alkaline Phosphatase 06/18/2020 49  38 - 126 U/L Final   Total Bilirubin 06/18/2020 0.3  0.3 - 1.2 mg/dL Final   GFR calc non Af Amer 06/18/2020 47* >60 mL/min Final   GFR calc Af Amer 06/18/2020 55* >60 mL/min Final   Anion gap  06/18/2020 7  5 - 15 Final   Performed at [facility], [place] [address], [place], [place] [address]   Prothrombin Time 06/18/2020 12.5  11.4 - 15.2 seconds Final   INR 06/18/2020 1.0  0.8 - 1.2 Final   Comment: (NOTE) INR goal varies based on device and disease states. Performed at [facility], [place] [address], [place], [place] [address]    ABO/RH(D) 06/18/2020 A POS   Final   Antibody Screen 06/18/2020 NEG   Final   Sample Expiration 06/18/2020 06/29/2020,2359   Final   Extend sample reason 06/18/2020    Final                   Value:NO TRANSFUSIONS OR PREGNANCY IN THE PAST 3 MONTHS Performed at [place] [address], [place], [place] [address]   Admission on 06/12/2020, Discharged on 06/12/2020  Component Date Value Ref Range Status   SURGICAL PATHOLOGY 06/12/2020    Final-Edited                   Value:SURGICAL PATHOLOGY CASE: [id number] PATIENT: [facility] Surgical Pathology Report     Clinical History: positive hemoccult     FINAL MICROSCOPIC DIAGNOSIS:  A. ANTRUM, BIOPSY: - Gastric antral mucosa with moderate nonspecific reactive gastropathy - Warthin Starry stain is negative for Helicobacter pylori  B. STOMACH, FUNDUS, POLYPECTOMY: - Fundic gland polyp(s) - Negative for dysplasia  C. ILEUM, ULCER, BIOPSY: - Severely active chronic nonspecific ileitis with ulceration, see comment - Negative for granulomas or dysplasia    COMMENT:  C.    Ileal biopsies show architectural distortion, edema, mild lamina propria lymphoplasmacytosis, pyloric gland metaplasia, relatively mild cryptitis and ulceration.  The findings are not diagnostic of idiopathic inflammatory bowel disease.  Differential diagnosis includes drug-effect, infection and less likely, inflammatory bowel disease. Clinical-radiologic correlation and patient follow-up is suggested.                             GROSS DESCRIPTION:  Specimen A: Received in formalin are tan-pink soft tissue fragments that are submitted in toto. Number: 3 size: 0.2 to 0.3 cm blocks: 1  Specimen B: Received in formalin is a 0.25 cm tan-pink polyp and a 0.1 cm tan-pink soft tissue fragment.  In toto 1 block.  Specimen C: Received in formalin are tan-pink soft tissue fragments that are submitted in toto. Number: 4 size: 0.25 to 0.4 cm blocks: 1  SW 06/12/2020   Final Diagnosis performed by Jaquita Folds, MD.   Electronically signed 06/13/2020 Technical component performed at [facility], [place] [address], [place], [place] [address].  Professional component performed at [facility], [place] [address] [place],  [address].  Immunohistochemistry Technical component (if applicable) was performed at [facility]. [address], [place], [place],  [address].   IMMUNOHISTOCHEMISTRY DISCLAIMER (if applicable): Some of these immu                         nohistochemical stains may have been developed and the performance characteristics determine by [facility]. Some may not have been cleared or approved by the U.S. Food and Drug Administration. The FDA has determined that such clearance or approval is not necessary. This test is used for clinical purposes. It should not be regarded as investigational or for research. This laboratory is certified under  the Clinical Laboratory Improvement Amendments of  1988 (CLIA-88) as qualified to perform high complexity clinical laboratory testing.  The controls stained appropriately.   Hospital Outpatient Visit on 06/10/2020  Component Date Value Ref Range Status   SARS Coronavirus 2 06/10/2020 NEGATIVE  NEGATIVE Final   Comment: (NOTE) SARS-CoV-2 target nucleic acids are NOT DETECTED.  The SARS-CoV-2 RNA is generally detectable in upper and lower respiratory specimens during the acute phase of infection. Negative results do not preclude SARS-CoV-2 infection, do not rule out co-infections with other pathogens, and should not be used as the sole basis for treatment or other patient management decisions. Negative results must be combined with clinical observations, patient history, and epidemiological information. The expected result is Negative.  Fact Sheet for Patients: SugarRoll.be  Fact Sheet for Healthcare Providers: https://www.woods-mathews.com/  This test is not yet approved or cleared by the [place] FDA and  has been authorized for detection and/or diagnosis of SARS-CoV-2 by FDA under an Emergency Use Authorization (EUA). This EUA will remain  in effect (meaning this test can be used) for the duration of the COVID-19 declaration under Se                          ction 564(b)(1) of the Act, 21 U.S.C. section 360bbb-3(b)(1), unless the authorization is terminated or revoked sooner.  Performed at [facility], [place] [address], [place], [place] [address]    WBC 06/10/2020 6.7  4.0 - 10.5 K/uL Final   RBC 06/10/2020 3.75* 4.22 - 5.81 MIL/uL Final   Hemoglobin 06/10/2020 11.7* 13.0 - 17.0 g/dL Final   HCT 06/10/2020 36.2* 39 - 52 % Final   MCV 06/10/2020 96.5  80.0 - 100.0 fL Final   MCH 06/10/2020 31.2  26.0 - 34.0 pg Final   MCHC 06/10/2020 32.3  30.0 - 36.0 g/dL Final   RDW 06/10/2020 13.2  11.5 - 15.5 % Final   Platelets 06/10/2020 223  150 - 400 K/uL Final   nRBC  06/10/2020 0.0  0.0 - 0.2 % Final   Neutrophils Relative % 06/10/2020 59  % Final   Neutro Abs 06/10/2020 4.0  1.7 - 7.7 K/uL Final   Lymphocytes Relative 06/10/2020 28  % Final   Lymphs Abs 06/10/2020 1.9  0.7 - 4.0 K/uL Final   Monocytes Relative 06/10/2020 9  % Final   Monocytes Absolute 06/10/2020 0.6  0 - 1 K/uL Final   Eosinophils Relative 06/10/2020 4  % Final   Eosinophils Absolute 06/10/2020 0.3  0 - 0 K/uL Final   Basophils Relative 06/10/2020 0  % Final   Basophils Absolute 06/10/2020 0.0  0 - 0 K/uL Final   Immature Granulocytes 06/10/2020 0  % Final   Abs Immature Granulocytes 06/10/2020 0.02  0.00 - 0.07 K/uL Final   Performed at [facility], [address] [place], [place] [address]     X-Rays:CT Abdomen Pelvis W Contrast  Result Date: 06/19/2020 CLINICAL DATA:  Nausea and vomiting. Mid to lower abdominal pain for 2-3 months. EXAM: CT ABDOMEN AND PELVIS WITH CONTRAST TECHNIQUE: Multidetector CT imaging of the abdomen and pelvis was performed using the standard protocol following bolus administration of intravenous contrast. CONTRAST:  152mL OMNIPAQUE IOHEXOL 300 MG/ML  SOLN COMPARISON:  06/07/2020 FINDINGS: Lower chest: No acute abnormality. Hepatobiliary: No suspicious liver abnormality. Gallbladder is unremarkable. No biliary dilatation. Pancreas: Unremarkable. No pancreatic ductal dilatation or surrounding inflammatory changes. Spleen: Normal in size without focal  abnormality. Adrenals/Urinary Tract: Normal appearance of the adrenal glands. Nonobstructing right kidney stone measures 5 mm. Bilateral parapelvic kidney cysts. Cortical cyst is noted arising from inferior pole of the left kidney measuring 5.5 cm. Chronic bilateral perinephric fat stranding noted which may be related to prior insult. No hydronephrosis or hydroureter identified. No ureteral lithiasis. Urinary bladder is unremarkable. Stomach/Bowel: Stomach appears within normal limits. No pathologic  dilatation of the large or small bowel loops. On today's exam there are several separate areas of segmental bowel wall thickening of the distal ileum which appears similar to the most recent previous exam from 06/07/2020. Bowel wall thickness measures up to 8 mm, image 42/2. There is evidence of mild intramural fatty deposition within the affected bowel loops compatible with chronic inflammation. No significant surrounding fat stranding or free fluid. No signs of penetrating disease, abscess or bowel obstruction. Separately, there is mild intramural fatty deposition involving the proximal ascending colon, image 53/2. No signs of active colitis. Extensive sigmoid diverticular disease is noted without acute inflammation. Vascular/Lymphatic: Mild aortic atherosclerosis without aneurysm there is no abdominopelvic adenopathy identified at this time. Reproductive: Prostate is unremarkable. Other: No free fluid or fluid collections identified. Left posterolateral abdominal wall hernia is identified which contains fat only, image 43/2. Musculoskeletal: Multilevel degenerative disc disease is identified throughout the lumbar spine. Previous posterior hardware fixation T10 through S1. No acute or suspicious osseous findings. IMPRESSION: 1. No acute findings identified within the abdomen or pelvis. 2. There are several distinct areas of segmental bowel wall thickening involving the distal ileum which appear similar to the most recent previous exam from 06/07/2020. There is evidence of mild intramural fatty deposition within the affected bowel loops compatible with chronic inflammation. No signs of penetrating disease, abscess or bowel obstruction. Imaging findings may represent sequelae of infectious or inflammatory enteritis. Crohn's disease is not excluded. 3. Extensive sigmoid diverticular disease without acute inflammation. 4. Nonobstructing right renal calculus. 5. Left posterolateral abdominal wall hernia contains fat  only. 6. Aortic atherosclerosis. Aortic Atherosclerosis (ICD10-I70.0). Electronically Signed   By: Kerby Moors M.D.   On: 06/19/2020 13:38   DG Pelvis Portable  Result Date: 06/26/2020 CLINICAL DATA:  Status post total hip replacement EXAM: PORTABLE PELVIS 1-2 VIEWS COMPARISON:  Intraoperative images June 26, 2020. FINDINGS: Frontal view of lower pelvis and bilateral hips obtained. There is a total hip replacement on the left with prosthetic components appearing well-seated on frontal view. No fracture or dislocation. The right hip joint appears unremarkable. Soft tissue air noted on the left. IMPRESSION: Status post total hip replacement on the left with prosthetic components appearing well-seated on frontal view. No fracture or dislocation. Electronically Signed   By: Lowella Grip III M.D.   On: 06/26/2020 13:34   DG C-Arm 1-60 Min-No Report  Result Date: 06/26/2020 Fluoroscopy was utilized by the requesting physician.  No radiographic interpretation.   DG HIP OPERATIVE UNILAT W OR W/O PELVIS LEFT  Result Date: 06/26/2020 CLINICAL DATA:  Elective surgery. EXAM: OPERATIVE left HIP (WITH PELVIS IF PERFORMED) 2 VIEWS TECHNIQUE: Fluoroscopic spot image(s) were submitted for interpretation post-operatively. Radiation exposure index: 4.3698 mGy. COMPARISON:  June 19, 2020. FINDINGS: Two intraoperative fluoroscopic images demonstrate the left acetabular and femoral components to be well situated. IMPRESSION: Status post left total hip arthroplasty. Electronically Signed   By: Marijo Conception M.D.   On: 06/26/2020 12:57    EKG: Orders placed or performed in visit on 06/10/20   EKG 12-Lead   EKG 12-Lead  EKG 12-Lead     Hospital Course: Gearald TAYRON HUNNELL is a 74 y.o. who was admitted to [facility]. They were brought to the operating room on 06/26/2020 and underwent Procedure(s): [place].  Patient tolerated the procedure well and was later  transferred to the recovery room and then to the orthopaedic floor for postoperative care. They were given PO and IV analgesics for pain control following their surgery. They were given 24 hours of postoperative antibiotics of  Anti-infectives (From admission, onward)   Start     Dose/Rate Route Frequency Ordered Stop   06/26/20 2130  ceFAZolin (ANCEF) IVPB 2g/100 mL premix        2 g 200 mL/hr over 30 Minutes Intravenous Every 6 hours 06/26/20 1445 06/27/20 0431   06/26/20 0600  ceFAZolin (ANCEF) 3 g in dextrose 5 % 50 mL IVPB        3 g 100 mL/hr over 30 Minutes Intravenous On call to O.R. 06/25/20 0447 06/26/20 1126     and started on DVT prophylaxis in the form of Aspirin and Brilinta.   PT and OT were ordered for total joint protocol. Discharge planning consulted to help with postop disposition and equipment needs.  Patient had a good night on the evening of surgery. They started to get up OOB with therapy on POD #0. Pt was seen during rounds and was ready to go home pending progress with therapy. He worked with therapy on POD #1 and was meeting his goals. Pt was discharged to home later that day in stable condition.  Diet: Cardiac diet Activity: WBAT Follow-up: in 2 weeks Disposition: Home with HEP Discharged Condition: stable   Discharge Instructions    Call MD / Call 911   Complete by: As directed    If you experience chest pain or shortness of breath, CALL 911 and be transported to the hospital emergency room.  If you develope a fever above 101 F, pus (white drainage) or increased drainage or redness at the wound, or calf pain, call your surgeon's office.   Change dressing   Complete by: As directed    You have an adhesive waterproof bandage over the incision. Leave this in place until your first follow-up appointment. Once you remove this you will not need to place another bandage.   Constipation Prevention   Complete by: As directed    Drink plenty of fluids.  Prune juice may  be helpful.  You may use a stool softener, such as Colace (over the counter) 100 mg twice a day.  Use MiraLax (over the counter) for constipation as needed.   Diet - low sodium heart healthy   Complete by: As directed    Do not sit on low chairs, stoools or toilet seats, as it may be difficult to get up from low surfaces   Complete by: As directed    Driving restrictions   Complete by: As directed    No driving for two weeks   TED hose   Complete by: As directed    Use stockings (TED hose) for three weeks on both leg(s).  You may remove them at night for sleeping.   Weight bearing as tolerated   Complete by: As directed      Allergies as of 06/27/2020      Reactions   Codeine Itching   Orange Fruit [citrus] Itching   Bee Venom Rash      Medication List    TAKE these  medications   acetaminophen 500 MG tablet Commonly known as: TYLENOL Take 1,000 mg by mouth every 8 (eight) hours as needed for mild pain or moderate pain.   aspirin 325 MG EC tablet Take 1 tablet (325 mg total) by mouth 2 (two) times daily for 20 days. Then resume one 81 mg aspirin once a day. What changed:   medication strength  how much to take  when to take this  additional instructions   buprenorphine 15 MCG/HR Commonly known as: BUTRANS Place 1 patch onto the skin once a week.   CAL-MAG-ZINC PO Take 1 tablet by mouth at bedtime.   carvedilol 6.25 MG tablet Commonly known as: COREG Take 6.25 mg by mouth 2 (two) times daily with a meal.   Cholecalciferol 125 MCG (5000 UT) Tabs Take 5,000 Units by mouth daily.   cyanocobalamin 1000 MCG tablet Take 1,000 mcg by mouth daily.   diclofenac Sodium 1 % Gel Commonly known as: VOLTAREN Apply 1 application topically daily as needed (pain).   DULoxetine 30 MG capsule Commonly known as: CYMBALTA Take 30 mg by mouth daily.   FISH OIL + D3 PO Take 1,400 mg by mouth daily.   gabapentin 300 MG capsule Commonly known as: NEURONTIN Take 300 mg by  mouth 2 (two) times daily.   HYDROcodone-acetaminophen 5-325 MG tablet Commonly known as: NORCO/VICODIN Take 1-2 tablets by mouth every 6 (six) hours as needed for severe pain.   methocarbamol 500 MG tablet Commonly known as: ROBAXIN Take 1 tablet (500 mg total) by mouth every 6 (six) hours as needed for muscle spasms.   MORINGA PO Take 1 capsule by mouth daily.   multivitamin with minerals Tabs tablet Take 1 tablet by mouth daily.   pantoprazole 40 MG tablet Commonly known as: Protonix Take 1 tablet (40 mg total) by mouth daily. What changed: when to take this   Probiotic Daily Caps Take 1 capsule by mouth daily.   promethazine 25 MG suppository Commonly known as: Phenergan Place 1 suppository (25 mg total) rectally 2 (two) times daily as needed for nausea or vomiting.   rosuvastatin 20 MG tablet Commonly known as: CRESTOR Take 20 mg by mouth daily.   tamsulosin 0.4 MG Caps capsule Commonly known as: FLOMAX Take 0.4 mg by mouth in the morning and at bedtime.   ticagrelor 90 MG Tabs tablet Commonly known as: BRILINTA Take 1 tablet (90 mg total) by mouth 2 (two) times daily.   tiZANidine 4 MG capsule Commonly known as: ZANAFLEX Take 4 mg by mouth 3 (three) times daily.            Discharge Care Instructions  (From admission, onward)         Start     Ordered   06/27/20 0000  Weight bearing as tolerated        06/27/20 0741   06/27/20 0000  Change dressing       Comments: You have an adhesive waterproof bandage over the incision. Leave this in place until your first follow-up appointment. Once you remove this you will not need to place another bandage.   06/27/20 0741          Follow-up Information    Gaynelle Arabian, MD. Schedule an appointment as soon as possible for a visit on 07/09/2020.   Specialty: Orthopedic Surgery Contact information: [address] [place] [address] [phone]               Signed: Drue Dun  [place], PA-C Orthopedic Surgery 07/01/2020, 7:35 AM

## 2020-07-15 DIAGNOSIS — N183 Chronic kidney disease, stage 3 unspecified: Secondary | ICD-10-CM | POA: Diagnosis not present

## 2020-07-15 DIAGNOSIS — D529 Folate deficiency anemia, unspecified: Secondary | ICD-10-CM | POA: Diagnosis not present

## 2020-07-15 DIAGNOSIS — D649 Anemia, unspecified: Secondary | ICD-10-CM | POA: Diagnosis not present

## 2020-07-15 DIAGNOSIS — E7800 Pure hypercholesterolemia, unspecified: Secondary | ICD-10-CM | POA: Diagnosis not present

## 2020-07-15 DIAGNOSIS — D519 Vitamin B12 deficiency anemia, unspecified: Secondary | ICD-10-CM | POA: Diagnosis not present

## 2020-07-15 DIAGNOSIS — K2100 Gastro-esophageal reflux disease with esophagitis, without bleeding: Secondary | ICD-10-CM | POA: Diagnosis not present

## 2020-07-18 DIAGNOSIS — Z23 Encounter for immunization: Secondary | ICD-10-CM | POA: Diagnosis not present

## 2020-07-18 DIAGNOSIS — N644 Mastodynia: Secondary | ICD-10-CM | POA: Diagnosis not present

## 2020-07-18 DIAGNOSIS — D649 Anemia, unspecified: Secondary | ICD-10-CM | POA: Diagnosis not present

## 2020-07-18 DIAGNOSIS — R7301 Impaired fasting glucose: Secondary | ICD-10-CM | POA: Diagnosis not present

## 2020-07-23 DIAGNOSIS — S43102A Unspecified dislocation of left acromioclavicular joint, initial encounter: Secondary | ICD-10-CM | POA: Diagnosis not present

## 2020-07-23 DIAGNOSIS — S43102D Unspecified dislocation of left acromioclavicular joint, subsequent encounter: Secondary | ICD-10-CM | POA: Diagnosis not present

## 2020-07-23 DIAGNOSIS — M25512 Pain in left shoulder: Secondary | ICD-10-CM | POA: Diagnosis not present

## 2020-07-30 DIAGNOSIS — Z96642 Presence of left artificial hip joint: Secondary | ICD-10-CM | POA: Diagnosis not present

## 2020-07-30 DIAGNOSIS — Z471 Aftercare following joint replacement surgery: Secondary | ICD-10-CM | POA: Diagnosis not present

## 2020-09-12 ENCOUNTER — Encounter (INDEPENDENT_AMBULATORY_CARE_PROVIDER_SITE_OTHER): Payer: Self-pay | Admitting: Gastroenterology

## 2020-09-12 ENCOUNTER — Ambulatory Visit (INDEPENDENT_AMBULATORY_CARE_PROVIDER_SITE_OTHER): Payer: Medicare Other | Admitting: Gastroenterology

## 2020-09-12 ENCOUNTER — Other Ambulatory Visit: Payer: Self-pay

## 2020-09-12 VITALS — BP 148/74 | HR 83 | Temp 98.6°F | Ht 76.0 in | Wt 279.8 lb

## 2020-09-12 DIAGNOSIS — R935 Abnormal findings on diagnostic imaging of other abdominal regions, including retroperitoneum: Secondary | ICD-10-CM

## 2020-09-12 DIAGNOSIS — D519 Vitamin B12 deficiency anemia, unspecified: Secondary | ICD-10-CM | POA: Diagnosis not present

## 2020-09-12 DIAGNOSIS — Z791 Long term (current) use of non-steroidal anti-inflammatories (NSAID): Secondary | ICD-10-CM | POA: Insufficient documentation

## 2020-09-12 DIAGNOSIS — K529 Noninfective gastroenteritis and colitis, unspecified: Secondary | ICD-10-CM | POA: Insufficient documentation

## 2020-09-12 DIAGNOSIS — E559 Vitamin D deficiency, unspecified: Secondary | ICD-10-CM | POA: Diagnosis not present

## 2020-09-12 NOTE — Progress Notes (Signed)
Maylon Peppers, M.D. Gastroenterology & Hepatology [facility] [address] [place], [place] [address]  Primary Care Physician: Manon Hilding, MD [place] [address]  I will communicate my assessment and recommendations to the referring MD via EMR. "Note: Occasional unusual wording and randomly placed punctuation marks may result from the use of speech recognition technology to transcribe this document"  Problems: 1. Scattered ileitis 2. Chronic Meloxicam use  History of Present Illness: Draven L Lengacher is a 74 y.o. male with PMH CKD, MI s/p PCI and stent placement recently on DAPT, OA and history of scattered ileitis, who presents for follow up of ileitis.  The patient was last seen on 05/02/2020 fior evaluation of positive FOBT. At that time, the patient was advised to avoid NSAIDs and was scheduled a colonoscopy. Colonoscopy in 06/2020 showed multiple ulcers in the TI with biopsies concnerning for acute on chronic inflammation. Due to this the patient underwent a CT abdomen that showeded several areas of ileal inflammation as depicted below.   Patient reports having some constipation recently after he took some pain medication after having a hip replacement but they were not NSAIDs. He currently has 2-3 BMs per day, with adequate consistency. Denies any rectal bleeding or melena. He overall feels well and denies any abdominal pain. The patient denies having any nausea, vomiting, fever, chills,  hematemesis, abdominal distention, diarrhea, jaundice, pruritus or weight loss.  CT abdomen 06/19/2020 1. No acute findings identified within the abdomen or pelvis. 2. There are several distinct areas of segmental bowel wall thickening involving the distal ileum which appear similar to the most recent previous exam from 06/07/2020. There is evidence of mild intramural fatty deposition within the affected bowel loops compatible with  chronic inflammation. No signs of penetrating disease, abscess or bowel obstruction. Imaging findings may represent sequelae of infectious or inflammatory enteritis. Crohn's disease is not excluded. 3. Extensive sigmoid diverticular disease without acute inflammation. 4. Nonobstructing right renal calculus. 5. Left posterolateral abdominal wall hernia contains fat only. 6. Aortic atherosclerosis.1. No acute findings identified within the abdomen or pelvis. 2. There are several distinct areas of segmental bowel wall thickening involving the distal ileum which appear similar to the most recent previous exam from 06/07/2020. There is evidence of mild intramural fatty deposition within the affected bowel loops compatible with chronic inflammation. No signs of penetrating disease, abscess or bowel obstruction. Imaging findings may represent sequelae of infectious or inflammatory enteritis. Crohn's disease is not excluded. 3. Extensive sigmoid diverticular disease without acute inflammation. 4. Nonobstructing right renal calculus. 5. Left posterolateral abdominal wall hernia contains fat only. 6. Aortic atherosclerosis.  Patient reports he was taking Meloxicam but he stopped taking this close to three months ago.  No EIM.  Last EGD: 06/2010 - gastritis and two gastric polyps. Bx of polyp positive for fundic gland polyp, reactive gastritis in random bx without HP. Last Colonoscopy: 06/2010 - multiple ulcers in the terminal ileum - path biopsies taken. Diverticulosis and hemorrhoids without other colonic findings. There was presence of acute on chronic architectural changes in the small bowel.  Past Medical History: Past Medical History:  Diagnosis Date  . Anal fissure   . Anemia   . Arthritis   . Complication of anesthesia   . Coronary artery disease   . Diverticulitis   . Family hx of colon cancer   . Kidney stone   . Myocardial infarction ([place]) 10/2019  . PONV (postoperative nausea  and vomiting)     Past Surgical History: Past Surgical History:  Procedure Laterality Date  . back surgery x 6 Other]    . BIOPSY  02/11/2018   Procedure: BIOPSY;  Surgeon: Rogene Houston, MD;  [facility];  Service: Endoscopy;;  ileal  . BIOPSY  06/12/2020   Procedure: BIOPSY;  Surgeon: Rogene Houston, MD;  [facility];  Service: Endoscopy;;  antral, ileal  . CARDIAC CATHETERIZATION  10/2019  . CIRCUMCISION    . COLONOSCOPY N/A 09/12/2015   Procedure: COLONOSCOPY;  Surgeon: Rogene Houston, MD;  [facility];  Service: Endoscopy;  Laterality: N/A;  2:25-moved up to [place] notified pt  . COLONOSCOPY N/A 02/11/2018   Procedure: COLONOSCOPY;  Surgeon: Rogene Houston, MD;  [facility];  Service: Endoscopy;  Laterality: N/A;  9:15-moved to [address] to notify pt  . COLONOSCOPY WITH PROPOFOL N/A 06/12/2020   Procedure: COLONOSCOPY WITH PROPOFOL;  Surgeon: Rogene Houston, MD;  [facility];  Service: Endoscopy;  Laterality: N/A;  830  . CORONARY ANGIOPLASTY WITH STENT PLACEMENT  10/2019  . ESOPHAGOGASTRODUODENOSCOPY (EGD) WITH PROPOFOL N/A 06/12/2020   Procedure: ESOPHAGOGASTRODUODENOSCOPY (EGD) WITH PROPOFOL;  Surgeon: Rogene Houston, MD;  [facility];  Service: Endoscopy;  Laterality: N/A;  . POLYPECTOMY  06/12/2020   Procedure: POLYPECTOMY;  Surgeon: Rogene Houston, MD;  [facility];  Service: Endoscopy;;  gatric  . repair of anal fissure     spincterotomy  . TOTAL HIP ARTHROPLASTY Left 06/26/2020   Procedure: TOTAL HIP ARTHROPLASTY ANTERIOR APPROACH;  Surgeon: Gaynelle Arabian, MD;  Location: [facility];  Service: Orthopedics;  Laterality: Left;  163min    Family History: Family History  Problem Relation Age of Onset  . Diabetes Mother   . Colon cancer Father     Social History: Social History   Tobacco Use  Smoking Status Never Smoker  Smokeless Tobacco Never Used   Social History   Substance  and Sexual Activity  Alcohol Use No  . Alcohol/week: 0.0 standard drinks   Social History   Substance and Sexual Activity  Drug Use No    Allergies: Allergies  Allergen Reactions  . Codeine Itching  . Orange Fruit [Citrus] Itching  . Bee Venom Rash    Medications: Current Outpatient Medications  Medication Sig Dispense Refill  . acetaminophen (TYLENOL) 500 MG tablet Take 1,000 mg by mouth every 8 (eight) hours as needed for mild pain or moderate pain.    . buprenorphine (BUTRANS) 15 MCG/HR Place 1 patch onto the skin once a week. 4 patch 2  . Calcium-Magnesium-Zinc (CAL-MAG-ZINC PO) Take 1 tablet by mouth at bedtime.    . carvedilol (COREG) 6.25 MG tablet Take 6.25 mg by mouth 2 (two) times daily with a meal.     . Cholecalciferol 5000 units TABS Take 5,000 Units by mouth daily.     . cyanocobalamin 1000 MCG tablet Take 1,000 mcg by mouth daily.    . diclofenac Sodium (VOLTAREN) 1 % GEL Apply 1 application topically daily as needed (pain).     . DULoxetine (CYMBALTA) 30 MG capsule Take 30 mg by mouth daily.    Marland Kitchen Fish Oil-Cholecalciferol (FISH OIL + D3 PO) Take 1,400 mg by mouth daily.     Marland Kitchen gabapentin (NEURONTIN) 300 MG capsule Take 300 mg by mouth 2 (two) times daily.     . methocarbamol (ROBAXIN) 500 MG tablet Take 1  tablet (500 mg total) by mouth every 6 (six) hours as needed for muscle spasms. 40 tablet 0  . Multiple Vitamin (MULTIVITAMIN WITH MINERALS) TABS tablet Take 1 tablet by mouth daily.    . pantoprazole (PROTONIX) 40 MG tablet Take 1 tablet (40 mg total) by mouth daily. (Patient taking differently: Take 40 mg by mouth 2 (two) times daily. ) 90 tablet 3  . Probiotic Product (PROBIOTIC DAILY) CAPS Take 1 capsule by mouth daily.    . rosuvastatin (CRESTOR) 20 MG tablet Take 20 mg by mouth daily.    . tamsulosin (FLOMAX) 0.4 MG CAPS capsule Take 0.4 mg by mouth in the morning and at bedtime.     . ticagrelor (BRILINTA) 90 MG TABS tablet Take 1 tablet (90 mg total) by  mouth 2 (two) times daily. 60 tablet   . tiZANidine (ZANAFLEX) 4 MG capsule Take 4 mg by mouth 3 (three) times daily.     No current facility-administered medications for this visit.    Review of Systems: GENERAL: negative for malaise, night sweats HEENT: No changes in hearing or vision, no nose bleeds or other nasal problems. NECK: Negative for lumps, goiter, pain and significant neck swelling RESPIRATORY: Negative for cough, wheezing CARDIOVASCULAR: Negative for chest pain, leg swelling, palpitations, orthopnea GI: SEE HPI MUSCULOSKELETAL: Negative for joint pain or swelling, back pain, and muscle pain. SKIN: Negative for lesions, rash PSYCH: Negative for sleep disturbance, mood disorder and recent psychosocial stressors. HEMATOLOGY Negative for prolonged bleeding, bruising easily, and swollen nodes. ENDOCRINE: Negative for cold or heat intolerance, polyuria, polydipsia and goiter. NEURO: negative for tremor, gait imbalance, syncope and seizures. The remainder of the review of systems is noncontributory.   Physical Exam: BP (!) 148/74 (BP Location: Right Arm, Patient Position: Sitting, Cuff Size: Large)   Pulse 83   Temp 98.6 F (37 C) (Oral)   Ht 6\' 4"  (1.93 m)   Wt 279 lb 12.8 oz (126.9 kg)   BMI 34.06 kg/m  GENERAL: The patient is AO x3, in no acute distress. Obese. HEENT: Head is normocephalic and atraumatic. EOMI are intact. Mouth is well hydrated and without lesions. NECK: Supple. No masses LUNGS: Clear to auscultation. No presence of rhonchi/wheezing/rales. Adequate chest expansion HEART: RRR, normal s1 and s2. ABDOMEN: Soft, nontender, no guarding, no peritoneal signs, and nondistended. BS +. No masses. EXTREMITIES: Without any cyanosis, clubbing, rash, lesions or edema. NEUROLOGIC: AOx3, no focal motor deficit. SKIN: no jaundice, no rashes  Imaging/Labs: as above  I personally reviewed and interpreted the available labs, imaging and endoscopic  files.  Impression and Plan: Letrell DAWSYN RAMSARAN is a 74 y.o. male with PMH MI s/p PCI and stent placement recently on DAPT, CKD, OA and history of scattered ileitis, who presents for follow up of ileitis. The patient has had presence of inflammation in his SB which goes at least back from 2019 when ileitis was initially found. It is very possible that he is presenting with late onset Crohn's disease of the SB, as he has some anemia and loose bowel movements (unfortunately no granuloma were present in Bx, which would be very specific); however, the clinical picture may be obscured by his chronic NSAID intake as this can led to a similar presentation. He has been avoiding NSAIDs, so by this time his anemia and imaging findings should have resolved. After a discussion with the patient, we will perform an MR enterography to evaluate if the areas of inflammation are still present (CT contraindicated because of  CKD). If he persists with the ifnlammation we will consider biologicals like Entyvio or Stelara. Patient understood and agreed. We will also check IBD labs today.  - MR enterography - Avoid using NSAIDs such as Aleve, ibuprofen, naproxen, Motrin, Voltaren or Advil (even the topical ones) - Check CBC, CMP, CRP, Vitamin D, Zn and B12 - RTC with results  All questions were answered.      Harvel Quale, MD Gastroenterology and Hepatology [facility]

## 2020-09-12 NOTE — Patient Instructions (Signed)
Schedule CT enterography with IV contrast Stop using NSAIDs such as Aleve, ibuprofen, naproxen, Motrin, Voltaren or Advil (even the topical ones)

## 2020-09-13 LAB — COMPREHENSIVE METABOLIC PANEL
AG Ratio: 2.3 (calc) (ref 1.0–2.5)
ALT: 13 U/L (ref 9–46)
AST: 18 U/L (ref 10–35)
Albumin: 4.2 g/dL (ref 3.6–5.1)
Alkaline phosphatase (APISO): 62 U/L (ref 35–144)
BUN/Creatinine Ratio: 12 (calc) (ref 6–22)
BUN: 19 mg/dL (ref 7–25)
CO2: 28 mmol/L (ref 20–32)
Calcium: 9.3 mg/dL (ref 8.6–10.3)
Chloride: 102 mmol/L (ref 98–110)
Creat: 1.56 mg/dL — ABNORMAL HIGH (ref 0.70–1.18)
Globulin: 1.8 g/dL (calc) — ABNORMAL LOW (ref 1.9–3.7)
Glucose, Bld: 124 mg/dL (ref 65–139)
Potassium: 4.9 mmol/L (ref 3.5–5.3)
Sodium: 139 mmol/L (ref 135–146)
Total Bilirubin: 0.3 mg/dL (ref 0.2–1.2)
Total Protein: 6 g/dL — ABNORMAL LOW (ref 6.1–8.1)

## 2020-09-13 LAB — VITAMIN B12: Vitamin B-12: >2000 pg/mL — ABNORMAL HIGH (ref 200–1100)

## 2020-09-13 LAB — CBC WITH DIFFERENTIAL/PLATELET
Absolute Monocytes: 469 cells/uL (ref 200–950)
Basophils Absolute: 28 cells/uL (ref 0–200)
Basophils Relative: 0.4 %
Eosinophils Absolute: 224 cells/uL (ref 15–500)
Eosinophils Relative: 3.2 %
HCT: 38 % — ABNORMAL LOW (ref 38.5–50.0)
Hemoglobin: 12.1 g/dL — ABNORMAL LOW (ref 13.2–17.1)
Lymphs Abs: 2212 cells/uL (ref 850–3900)
MCH: 28.9 pg (ref 27.0–33.0)
MCHC: 31.8 g/dL — ABNORMAL LOW (ref 32.0–36.0)
MCV: 90.9 fL (ref 80.0–100.0)
MPV: 10.7 fL (ref 7.5–12.5)
Monocytes Relative: 6.7 %
Neutro Abs: 4067 cells/uL (ref 1500–7800)
Neutrophils Relative %: 58.1 %
Platelets: 268 10*3/uL (ref 140–400)
RBC: 4.18 10*6/uL — ABNORMAL LOW (ref 4.20–5.80)
RDW: 13 % (ref 11.0–15.0)
Total Lymphocyte: 31.6 %
WBC: 7 10*3/uL (ref 3.8–10.8)

## 2020-09-13 LAB — C-REACTIVE PROTEIN: CRP: 2.4 mg/L (ref <8.0)

## 2020-09-13 LAB — ZINC: Zinc: 55 ug/dL — ABNORMAL LOW (ref 60–130)

## 2020-09-13 LAB — VITAMIN D 25 HYDROXY (VIT D DEFICIENCY, FRACTURES): Vit D, 25-Hydroxy: 55 ng/mL (ref 30–100)

## 2020-09-13 NOTE — Addendum Note (Signed)
Addended by: Harvel Quale on: 09/13/2020 11:37 AM   Modules accepted: Orders

## 2020-09-13 NOTE — Progress Notes (Signed)
Order is in, I added it to the note from yesterday

## 2020-09-20 ENCOUNTER — Other Ambulatory Visit: Payer: Self-pay

## 2020-09-20 ENCOUNTER — Emergency Department (HOSPITAL_COMMUNITY)
Admission: EM | Admit: 2020-09-20 | Discharge: 2020-09-21 | Disposition: A | Discharge status: Home / Self Care | Payer: Medicare Other | Attending: Emergency Medicine | Admitting: Emergency Medicine

## 2020-09-20 ENCOUNTER — Emergency Department (HOSPITAL_COMMUNITY): Payer: Medicare Other

## 2020-09-20 ENCOUNTER — Encounter (HOSPITAL_COMMUNITY): Payer: Self-pay | Admitting: Emergency Medicine

## 2020-09-20 DIAGNOSIS — I2510 Atherosclerotic heart disease of native coronary artery without angina pectoris: Secondary | ICD-10-CM | POA: Diagnosis not present

## 2020-09-20 DIAGNOSIS — S301XXA Contusion of abdominal wall, initial encounter: Secondary | ICD-10-CM | POA: Diagnosis not present

## 2020-09-20 DIAGNOSIS — Y9301 Activity, walking, marching and hiking: Secondary | ICD-10-CM | POA: Insufficient documentation

## 2020-09-20 DIAGNOSIS — Z96642 Presence of left artificial hip joint: Secondary | ICD-10-CM | POA: Insufficient documentation

## 2020-09-20 DIAGNOSIS — Y92009 Unspecified place in unspecified non-institutional (private) residence as the place of occurrence of the external cause: Secondary | ICD-10-CM | POA: Diagnosis not present

## 2020-09-20 DIAGNOSIS — S2231XD Fracture of one rib, right side, subsequent encounter for fracture with routine healing: Secondary | ICD-10-CM | POA: Diagnosis not present

## 2020-09-20 DIAGNOSIS — T8484XA Pain due to internal orthopedic prosthetic devices, implants and grafts, initial encounter: Secondary | ICD-10-CM | POA: Insufficient documentation

## 2020-09-20 DIAGNOSIS — S79922A Unspecified injury of left thigh, initial encounter: Secondary | ICD-10-CM | POA: Diagnosis present

## 2020-09-20 DIAGNOSIS — M25552 Pain in left hip: Secondary | ICD-10-CM | POA: Diagnosis not present

## 2020-09-20 DIAGNOSIS — W19XXXA Unspecified fall, initial encounter: Secondary | ICD-10-CM

## 2020-09-20 DIAGNOSIS — S7012XA Contusion of left thigh, initial encounter: Secondary | ICD-10-CM | POA: Insufficient documentation

## 2020-09-20 IMAGING — CR DG RIBS W/ CHEST 3+V*R*
5 series · 5 of 5 positions shown · non-contrast
Comparison: [DATE]

CLINICAL DATA: Fall, right posterior rib pain

EXAM:
RIGHT RIBS AND CHEST - 3+ VIEW

[w chest pa]
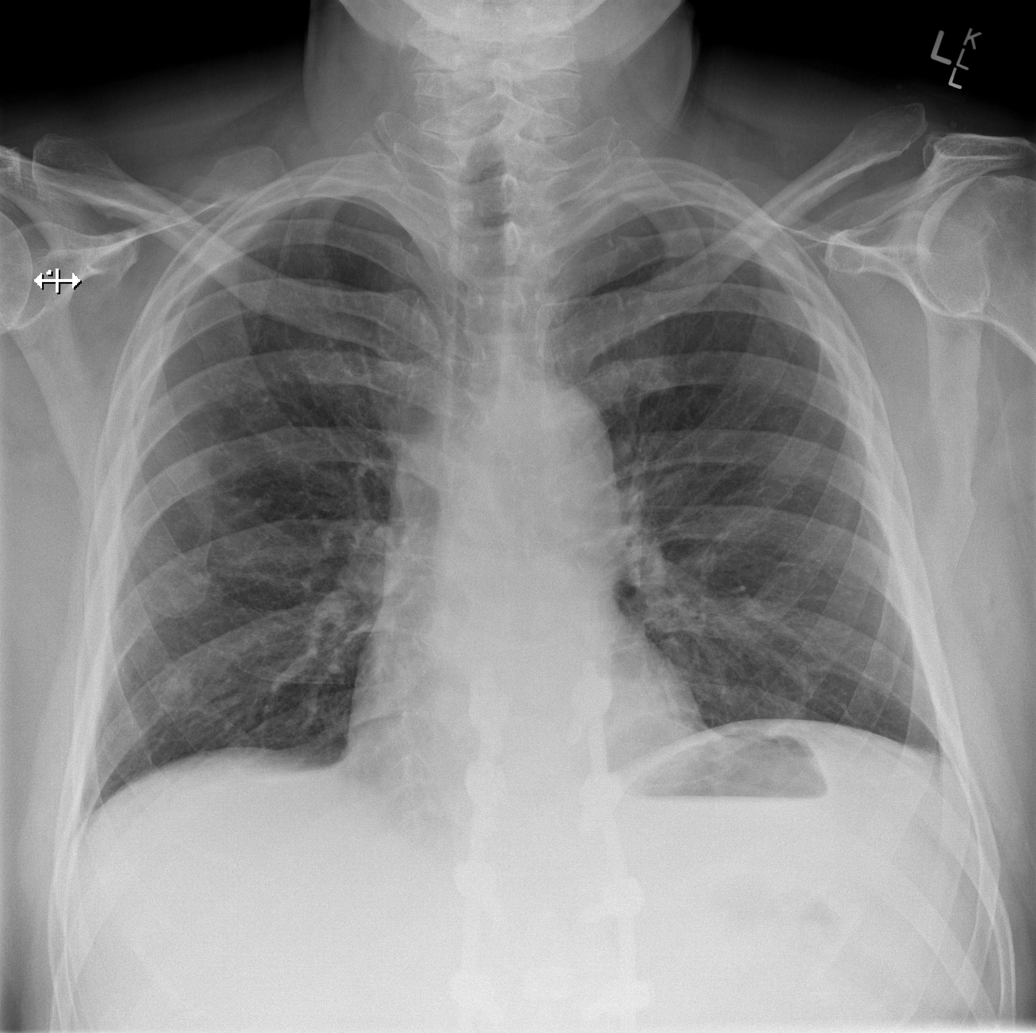

[w ribs ap upper right]
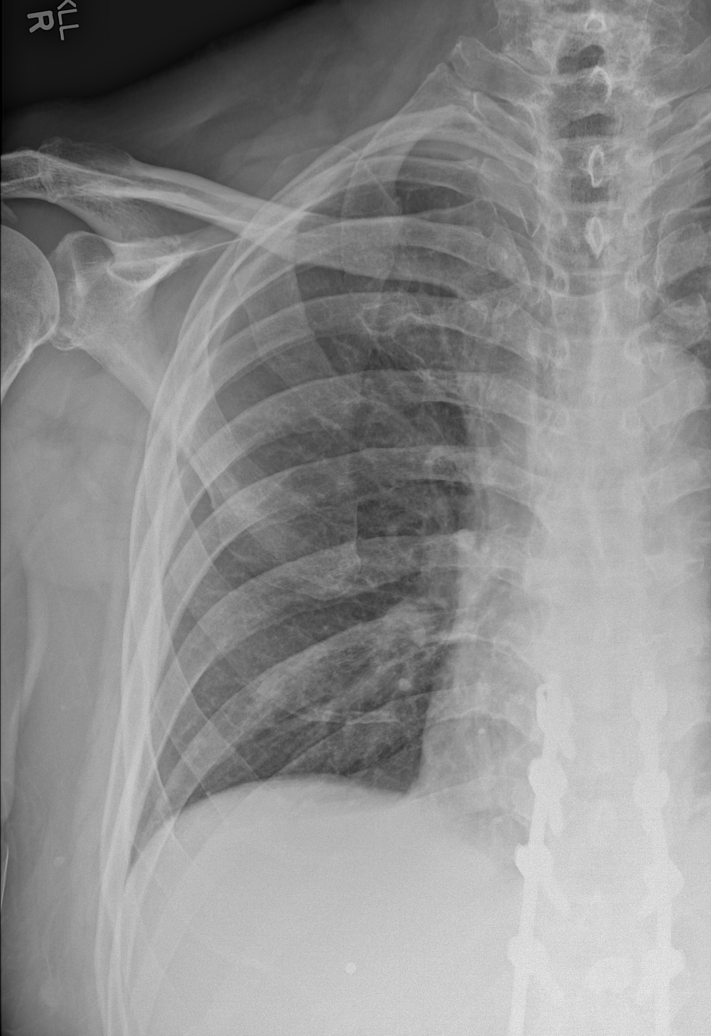

[w ribs ap lower right]
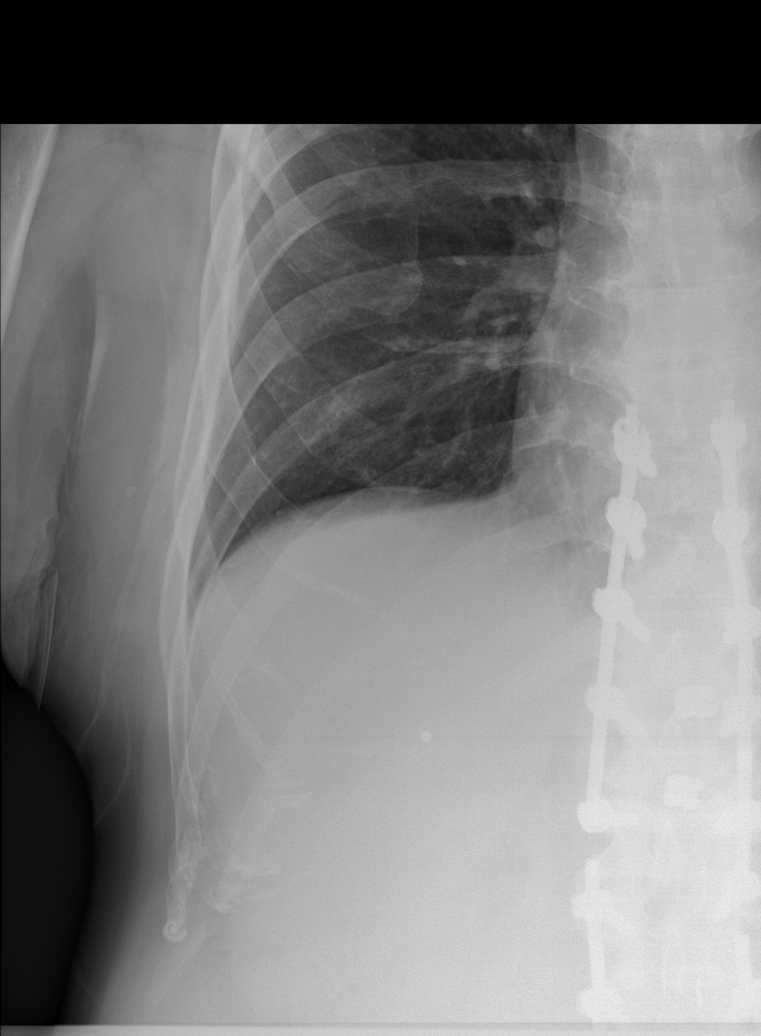

[w ribs obl right (1 of 2)]
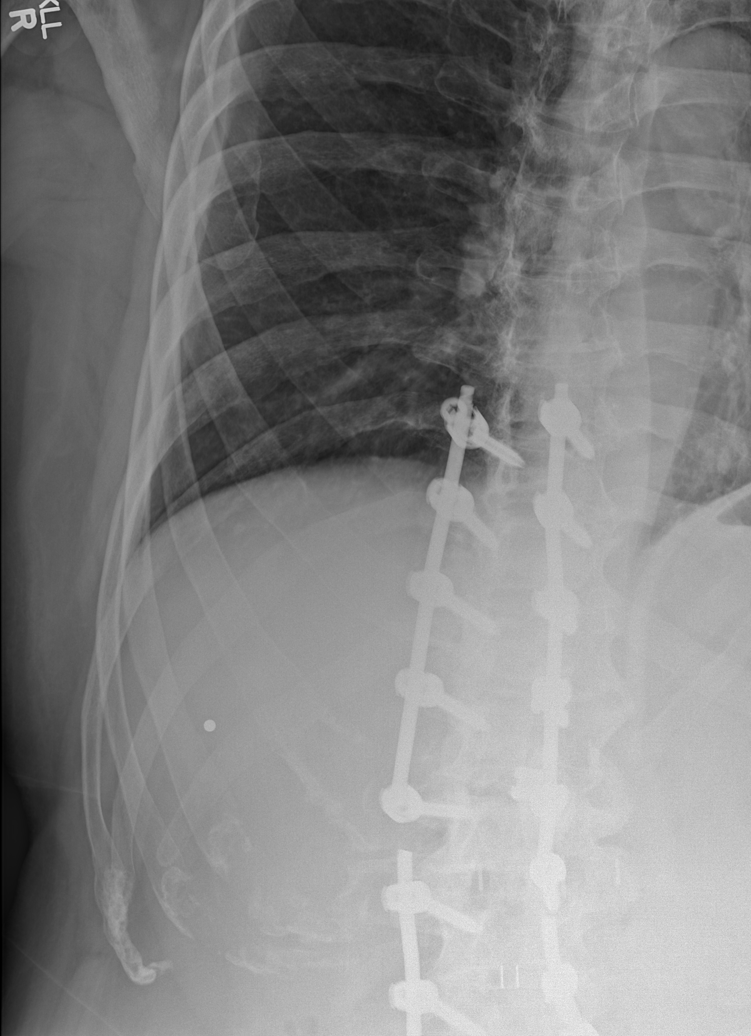

[w ribs obl right (2 of 2)]
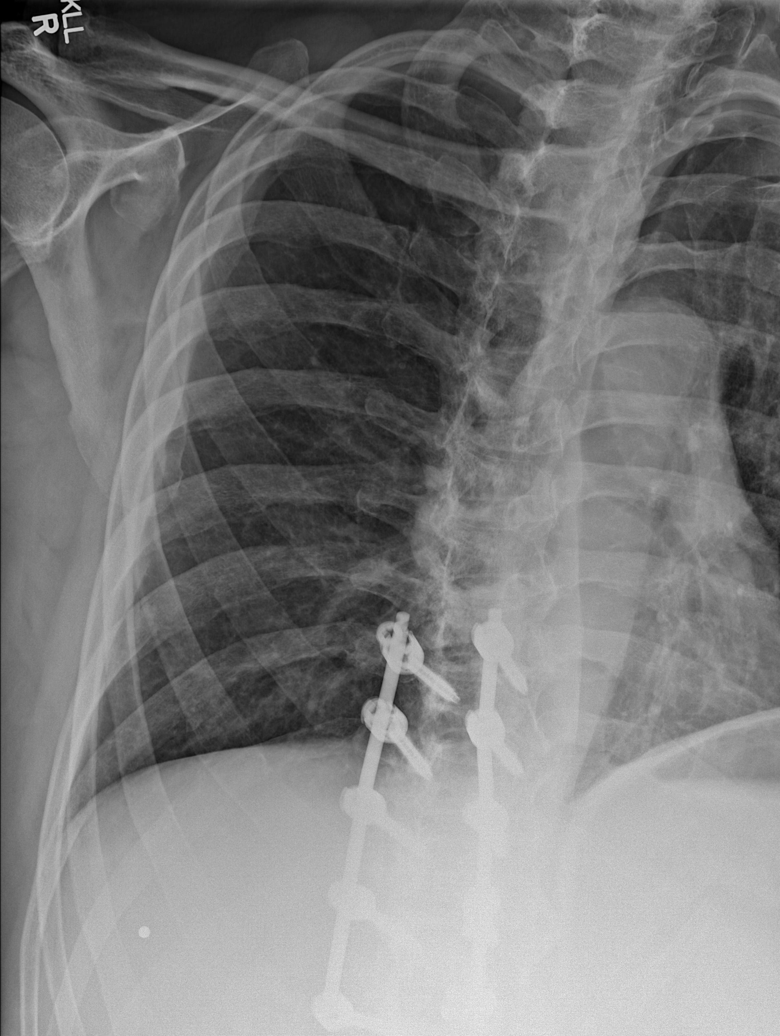

[5 of 5 positions shown; findings below may reference images not displayed]

FINDINGS: Old healed right anterior 6th rib fracture. No acute rib fractures.
Lungs clear. No effusions or pneumothorax. Heart is normal size.
IMPRESSION: No acute findings.  No visualized displaced acute rib fracture.

## 2020-09-20 IMAGING — CR DG HIP (WITH OR WITHOUT PELVIS) 2-3V*R*
2 series · 2 of 2 positions shown · non-contrast
Comparison: [DATE]

CLINICAL DATA: Fall, pain

EXAM:
DG HIP (WITH OR WITHOUT PELVIS) 2-3V RIGHT

[t hip ap right]
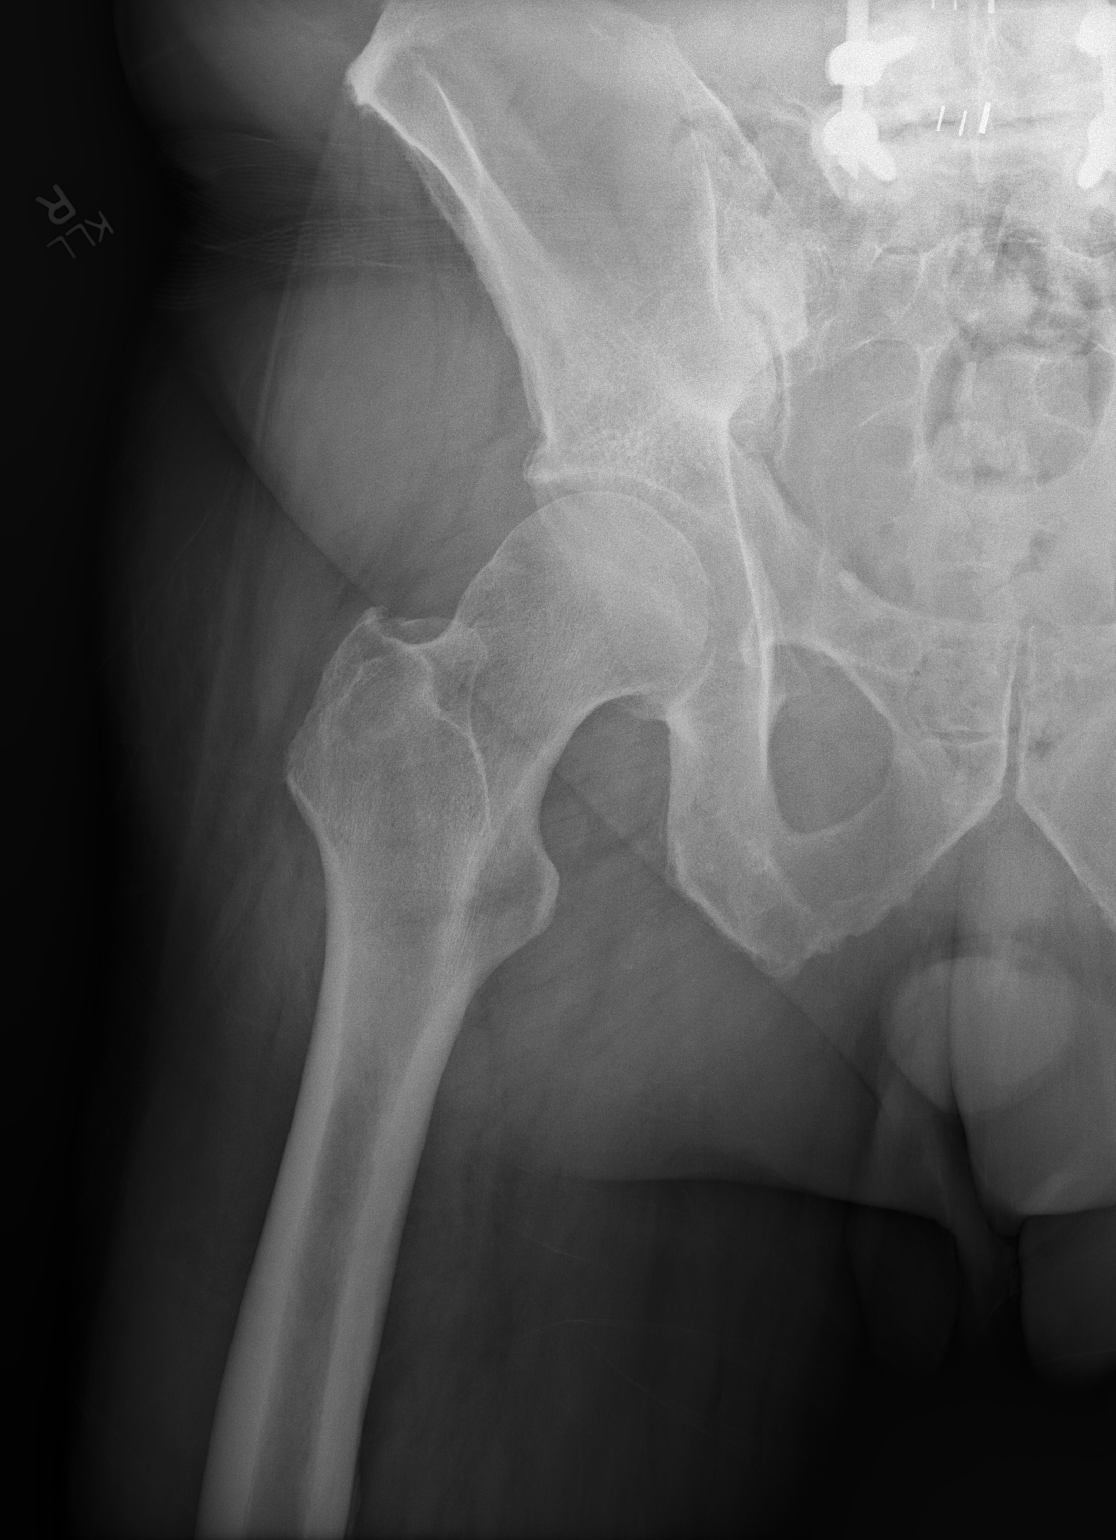

[t hip frog leg right]
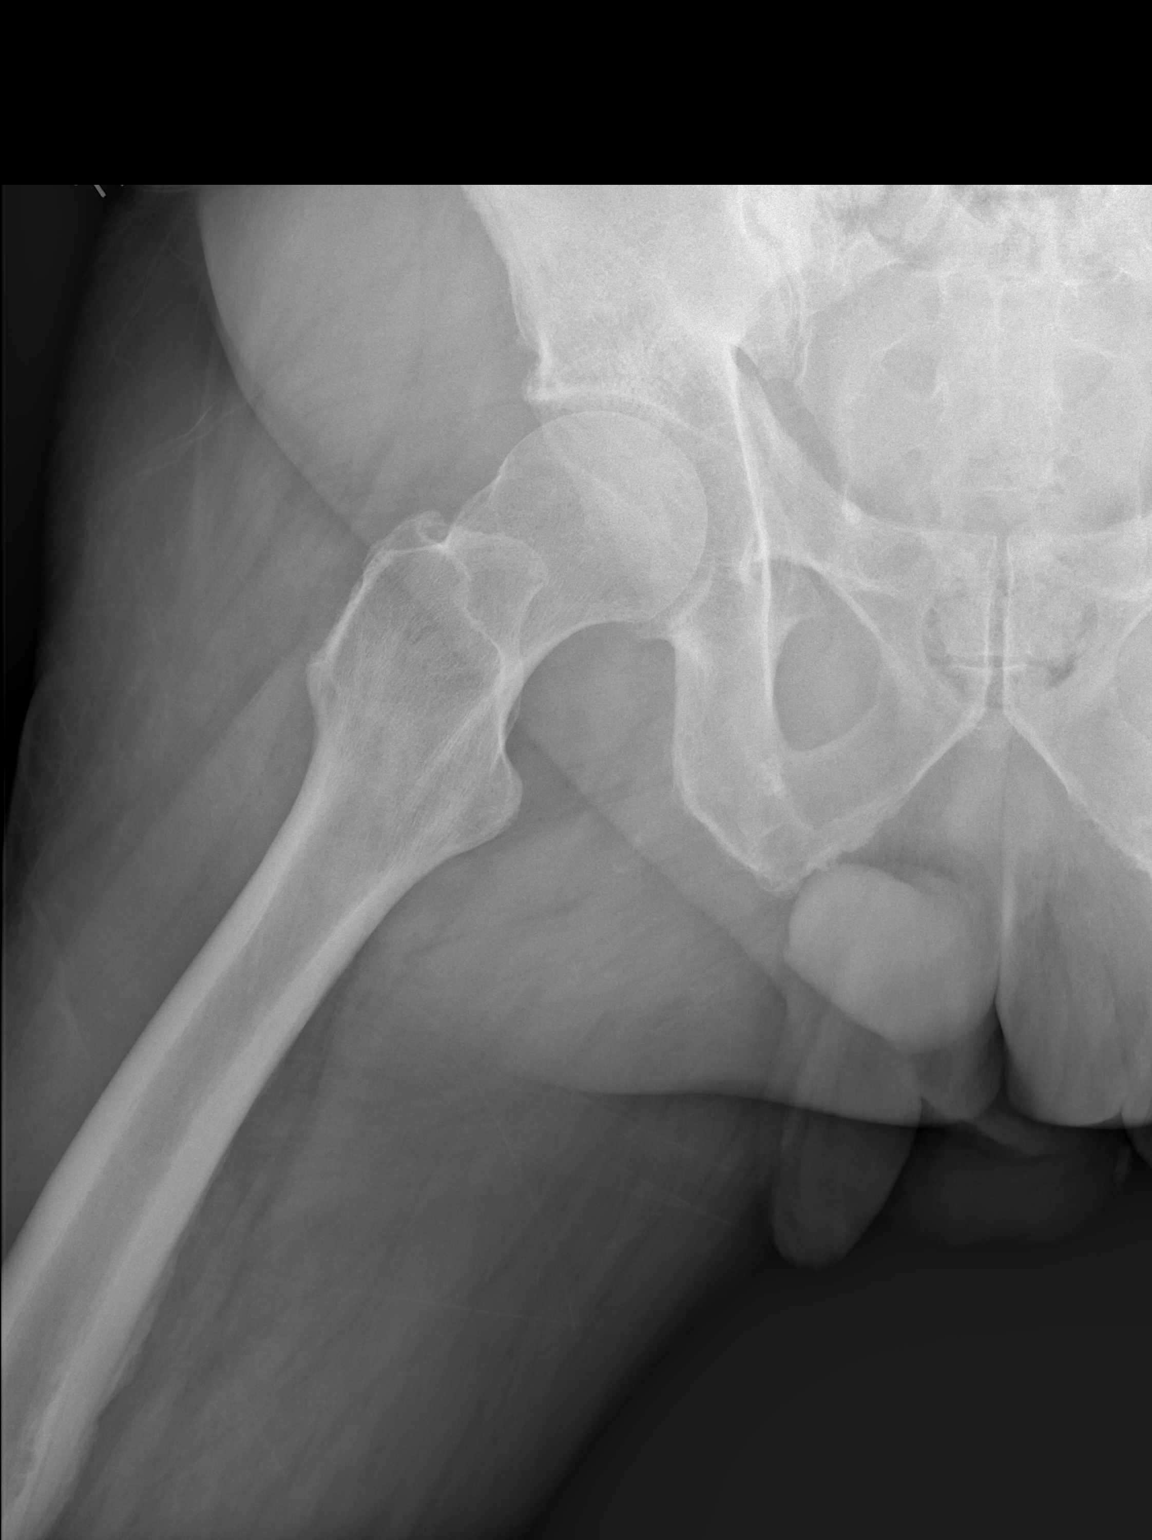

[2 of 2 positions shown; findings below may reference images not displayed]

FINDINGS: There is no evidence of hip fracture or dislocation. There is no
evidence of arthropathy or other focal bone abnormality.
IMPRESSION: Negative.

## 2020-09-20 IMAGING — CR DG HIP (WITH OR WITHOUT PELVIS) 2-3V*L*
4 series · 4 of 4 positions shown · non-contrast
Comparison: None.

CLINICAL DATA: Fall, left hip pain

EXAM:
DG HIP (WITH OR WITHOUT PELVIS) 2-3V LEFT

[t pelvis ap (1 of 2)]
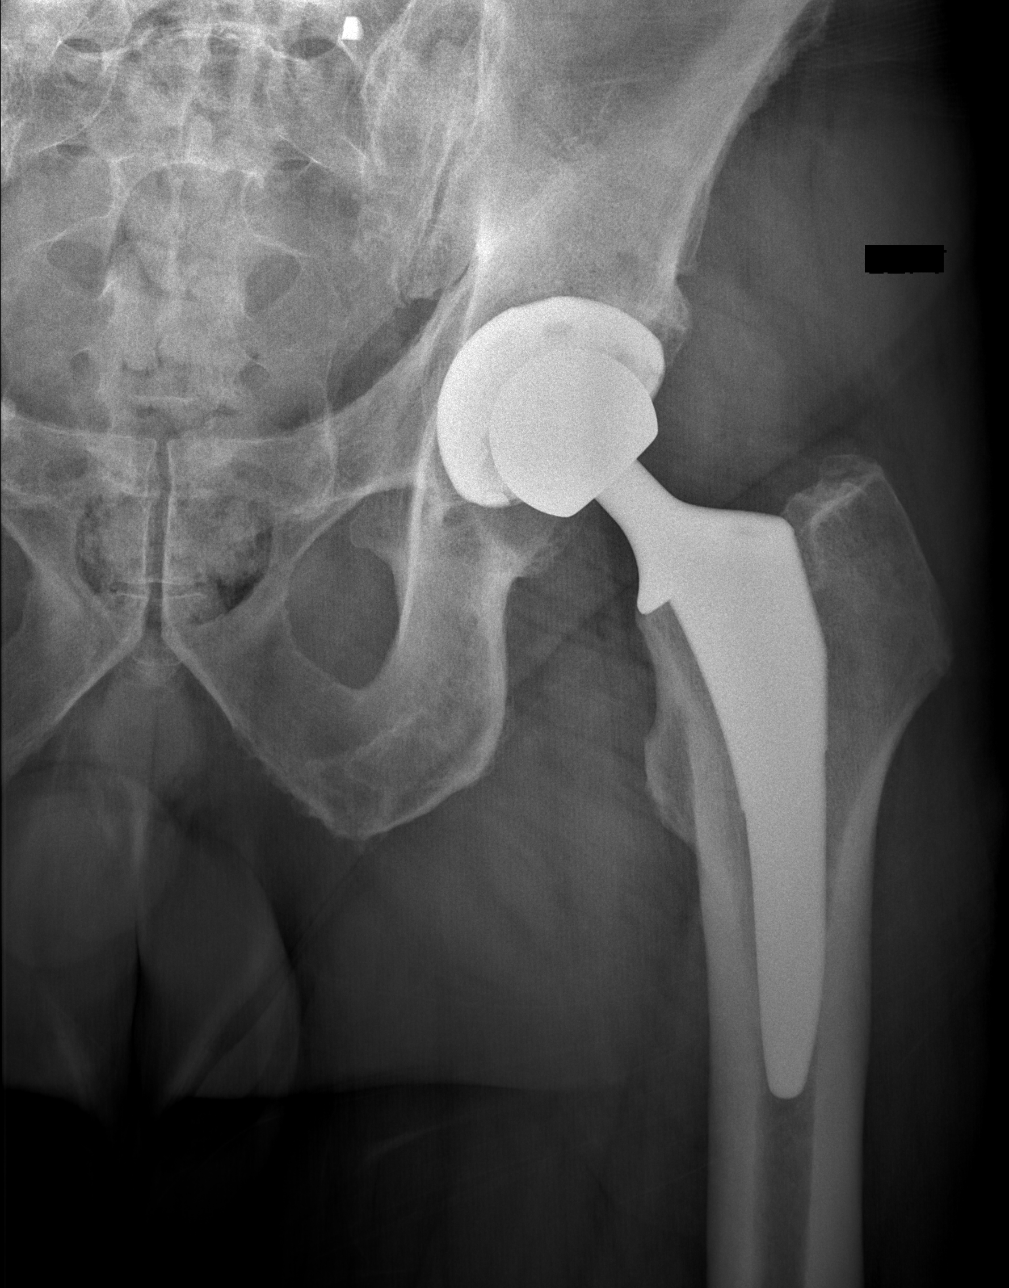

[t hip frog leg left]
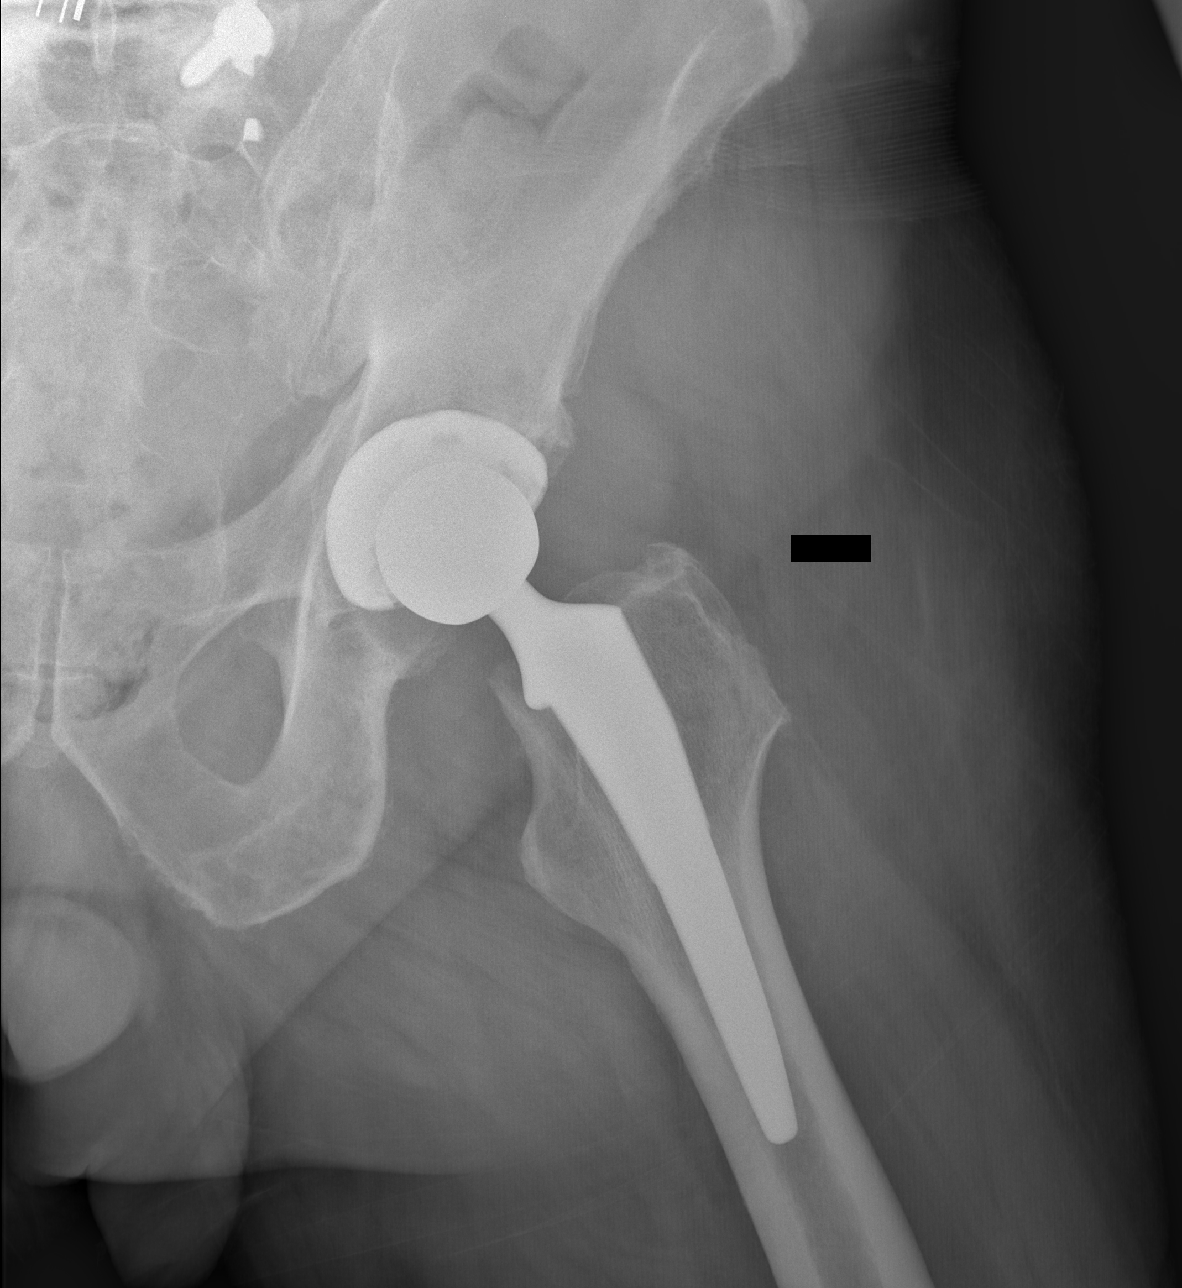

[t pelvis ap (2 of 2)]
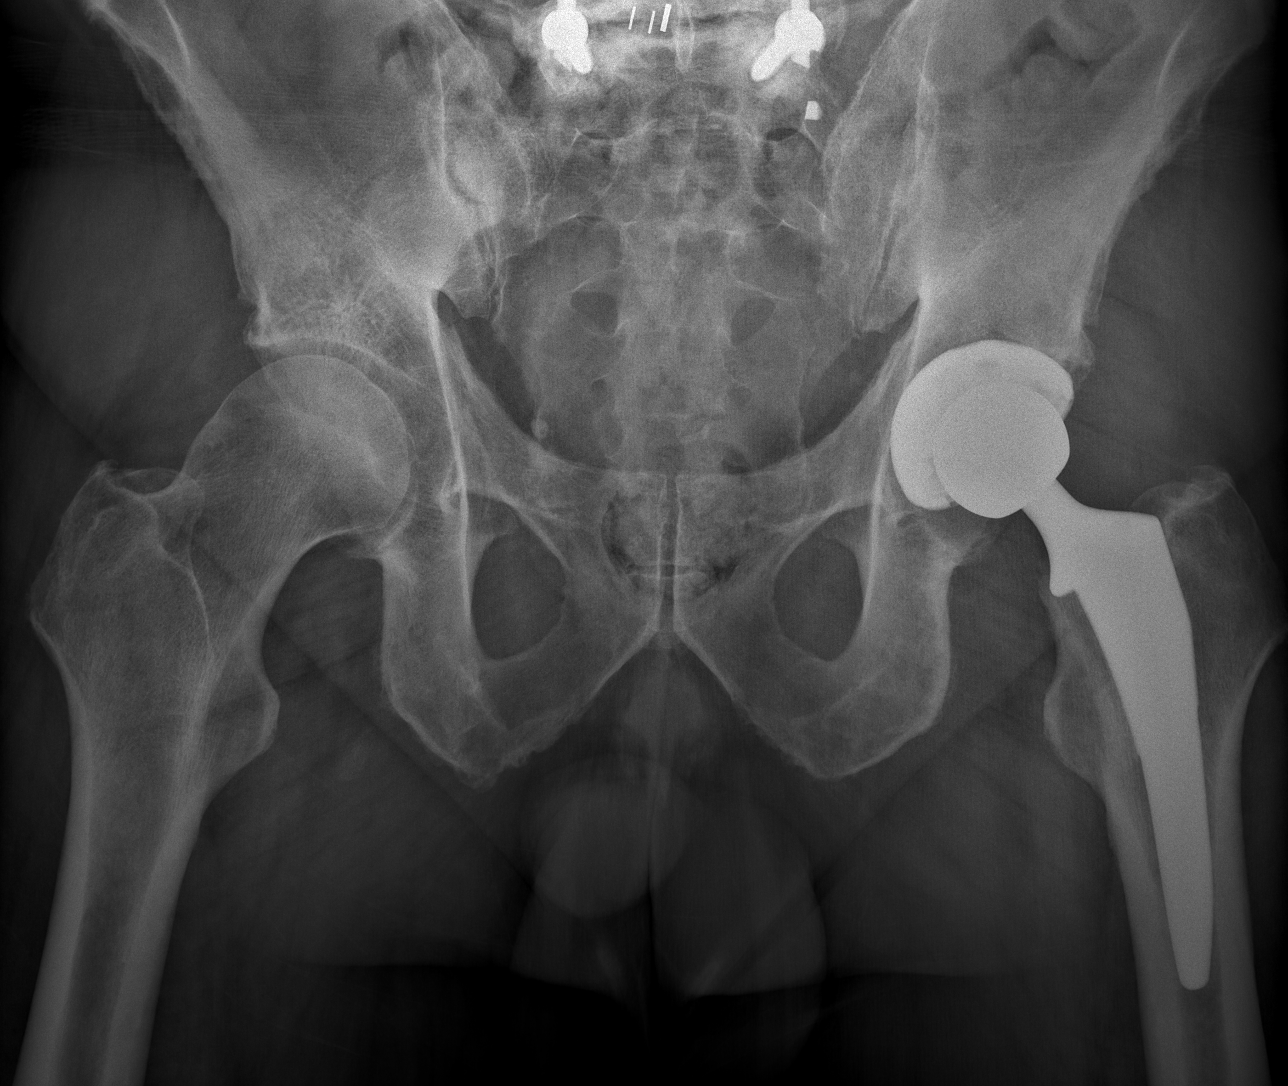

[t hip ap left]
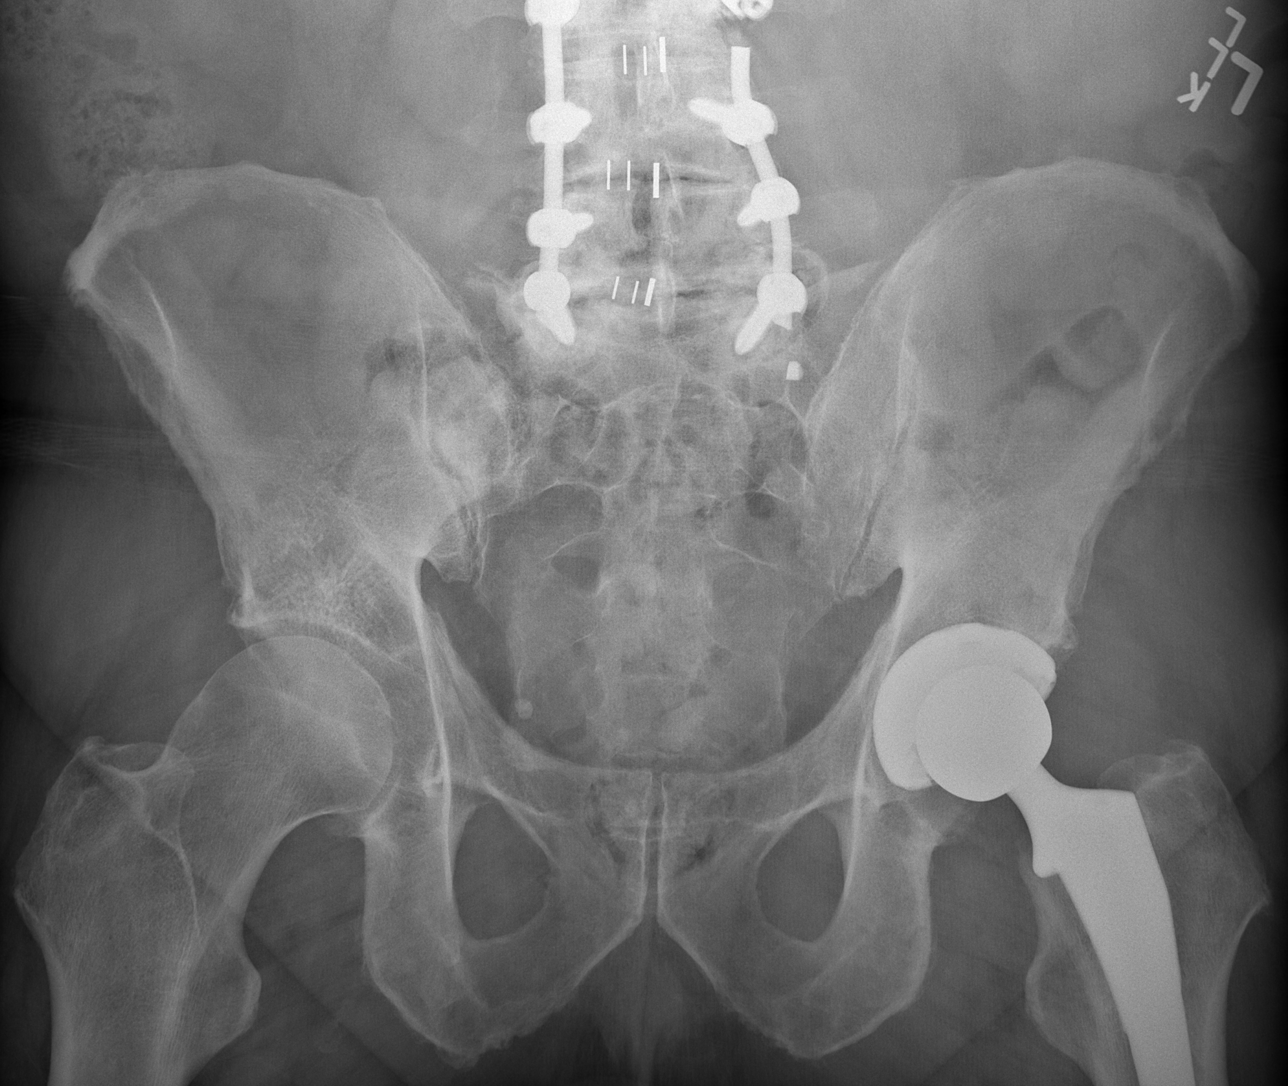

[4 of 4 positions shown; findings below may reference images not displayed]

FINDINGS: Changes of left hip replacement. Normal alignment. No hardware bony
complicating feature. No acute bony abnormality. Specifically, no
fracture, subluxation, or dislocation. Posterior fusion changes in
the lower lumbar spine.
IMPRESSION: Left hip replacement.

No acute bony abnormality.

## 2020-09-20 NOTE — ED Provider Notes (Signed)
[place] DEPT Provider Note: Georgena Spurling, MD, FACEP  CSN: [id number] MRN: [id number] ARRIVAL: 09/20/20 at 2101 ROOM: [place]  Fall   HISTORY OF PRESENT ILLNESS  09/20/20 11:35 PM Bruce Stewart is a 74 y.o. male who had a left THA 3 months ago.  Earlier this evening he fell while getting up out of a seated walker.  He states he did not have the brakes on and it slipped out from under him.  He fell onto his right side.  He has a hematoma to his right lateral thigh, right lateral chest, and pain in his left hip.  He is concerned about a periprosthetic fracture of the left hip.  He rated his pain as a 10 out of 10 in his left hip.  He describes it as aching in nature and worse when ambulating.  At rest his pain is not significant.  He did not lose consciousness.  He is able to ambulate and bear weight.   Past Medical History:  Diagnosis Date  . Anal fissure   . Anemia   . Arthritis   . Complication of anesthesia   . Coronary artery disease   . Diverticulitis   . Family hx of colon cancer   . Kidney stone   . Myocardial infarction ([place]) 10/2019  . PONV (postoperative nausea and vomiting)     Past Surgical History:  Procedure Laterality Date  . back surgery x 6 Other]    . BIOPSY  02/11/2018   Procedure: BIOPSY;  Surgeon: Rogene Houston, MD;  [facility];  Service: Endoscopy;;  ileal  . BIOPSY  06/12/2020   Procedure: BIOPSY;  Surgeon: Rogene Houston, MD;  [facility];  Service: Endoscopy;;  antral, ileal  . CARDIAC CATHETERIZATION  10/2019  . CIRCUMCISION    . COLONOSCOPY N/A 09/12/2015   Procedure: COLONOSCOPY;  Surgeon: Rogene Houston, MD;  [facility];  Service: Endoscopy;  Laterality: N/A;  2:25-moved up to [place] notified pt  . COLONOSCOPY N/A 02/11/2018   Procedure: COLONOSCOPY;  Surgeon: Rogene Houston, MD;  [facility];  Service: Endoscopy;  Laterality: N/A;  9:15-moved to [address] to  notify pt  . COLONOSCOPY WITH PROPOFOL N/A 06/12/2020   Procedure: COLONOSCOPY WITH PROPOFOL;  Surgeon: Rogene Houston, MD;  [facility];  Service: Endoscopy;  Laterality: N/A;  830  . CORONARY ANGIOPLASTY WITH STENT PLACEMENT  10/2019  . ESOPHAGOGASTRODUODENOSCOPY (EGD) WITH PROPOFOL N/A 06/12/2020   Procedure: ESOPHAGOGASTRODUODENOSCOPY (EGD) WITH PROPOFOL;  Surgeon: Rogene Houston, MD;  [facility];  Service: Endoscopy;  Laterality: N/A;  . POLYPECTOMY  06/12/2020   Procedure: POLYPECTOMY;  Surgeon: Rogene Houston, MD;  [facility];  Service: Endoscopy;;  gatric  . repair of anal fissure     spincterotomy  . TOTAL HIP ARTHROPLASTY Left 06/26/2020   Procedure: TOTAL HIP ARTHROPLASTY ANTERIOR APPROACH;  Surgeon: Gaynelle Arabian, MD;  [facility]: [facility];  Service: Orthopedics;  Laterality: Left;  11min    Family History  Problem Relation Age of Onset  . Diabetes Mother   . Colon cancer Father     Social History   Tobacco Use  . Smoking status: Never Smoker  . Smokeless tobacco: Never Used  Vaping Use  . Vaping Use: Never used  Substance Use Topics  . Alcohol use: No    Alcohol/week: 0.0 standard drinks  . Drug use: No  Prior to Admission medications   Medication Sig Start Date End Date Taking? Authorizing Provider  acetaminophen (TYLENOL) 500 MG tablet Take 1,000 mg by mouth every 8 (eight) hours as needed for mild pain or moderate pain.    [provider]  Calcium-Magnesium-Zinc (CAL-MAG-ZINC PO) Take 1 tablet by mouth at bedtime.    [provider]  carvedilol (COREG) 6.25 MG tablet Take 6.25 mg by mouth 2 (two) times daily with a meal.  10/06/19   [provider]  Cholecalciferol 5000 units TABS Take 5,000 Units by mouth daily.     [provider]  cyanocobalamin 1000 MCG tablet Take 1,000 mcg by mouth daily.    [provider]  diclofenac Sodium (VOLTAREN) 1 % GEL Apply 1 application  topically daily as needed (pain).     [provider]  DULoxetine (CYMBALTA) 30 MG capsule Take 30 mg by mouth daily.    [provider]  Fish Oil-Cholecalciferol (FISH OIL + D3 PO) Take 1,400 mg by mouth daily.     [provider]  gabapentin (NEURONTIN) 300 MG capsule Take 300 mg by mouth 2 (two) times daily.     [provider]  methocarbamol (ROBAXIN) 500 MG tablet Take 1 tablet (500 mg total) by mouth every 6 (six) hours as needed for muscle spasms. 06/27/20   Edmisten, Ok Anis, PA  Multiple Vitamin (MULTIVITAMIN WITH MINERALS) TABS tablet Take 1 tablet by mouth daily.    [provider]  pantoprazole (PROTONIX) 40 MG tablet Take 1 tablet (40 mg total) by mouth daily. Patient taking differently: Take 40 mg by mouth 2 (two) times daily.  05/23/20   Laurine Blazer A, PA-C  Probiotic Product (PROBIOTIC DAILY) CAPS Take 1 capsule by mouth daily.    [provider]  rosuvastatin (CRESTOR) 20 MG tablet Take 20 mg by mouth daily.    [provider]  tamsulosin (FLOMAX) 0.4 MG CAPS capsule Take 0.4 mg by mouth in the morning and at bedtime.     [provider]  ticagrelor (BRILINTA) 90 MG TABS tablet Take 1 tablet (90 mg total) by mouth 2 (two) times daily. 06/13/20   Rehman, Mechele Dawley, MD  tiZANidine (ZANAFLEX) 4 MG capsule Take 4 mg by mouth 3 (three) times daily.    [provider]    Allergies Codeine, Orange fruit [citrus], and Bee venom   REVIEW OF SYSTEMS  Negative except as noted here or in the History of Present Illness.   PHYSICAL EXAMINATION  Initial Vital Signs Blood pressure (!) 140/95, pulse 91, temperature 98.7 F (37.1 C), resp. rate 16, SpO2 98 %.  Examination General: Well-developed, well-nourished male in no acute distress; appearance consistent with age of record HENT: normocephalic; atraumatic Eyes: Normal appearance Neck: supple Heart: regular rate and rhythm Lungs: clear to  auscultation bilaterally Abdomen: soft; nondistended; nontender; bowel sounds present; small right flank hematoma:    Extremities: No deformity; pulses normal; large hematoma to right lateral thigh without signs of compartment syndrome:    Neurologic: Awake, alert and oriented; motor function intact in all extremities and symmetric; no facial droop Skin: Warm and dry Psychiatric: Normal mood and affect   RESULTS  Summary of this visit's results, reviewed and interpreted by myself:   EKG Interpretation  Date/Time:    Ventricular Rate:    PR Interval:    QRS Duration:   QT Interval:    QTC Calculation:   R Axis:     Text Interpretation:  Laboratory Studies: No results found for this or any previous visit (from the past 24 hour(s)). Imaging Studies: DG Ribs Unilateral W/Chest Right  Result Date: 09/20/2020 CLINICAL DATA:  Fall, right posterior rib pain EXAM: RIGHT RIBS AND CHEST - 3+ VIEW COMPARISON:  10/04/2019 FINDINGS: Old healed right anterior 6th rib fracture. No acute rib fractures. Lungs clear. No effusions or pneumothorax. Heart is normal size. IMPRESSION: No acute findings.  No visualized displaced acute rib fracture. Electronically Signed   By: Rolm Baptise M.D.   On: 09/20/2020 22:53   DG Hip Unilat With Pelvis 2-3 Views Left  Result Date: 09/20/2020 CLINICAL DATA:  Fall, left hip pain EXAM: DG HIP (WITH OR WITHOUT PELVIS) 2-3V LEFT COMPARISON:  None. FINDINGS: Changes of left hip replacement. Normal alignment. No hardware bony complicating feature. No acute bony abnormality. Specifically, no fracture, subluxation, or dislocation. Posterior fusion changes in the lower lumbar spine. IMPRESSION: Left hip replacement. No acute bony abnormality. Electronically Signed   By: Rolm Baptise M.D.   On: 09/20/2020 22:41   DG Hip Unilat  With Pelvis 2-3 Views Right  Result Date: 09/20/2020 CLINICAL DATA:  Fall, pain EXAM: DG HIP (WITH OR WITHOUT PELVIS) 2-3V RIGHT  COMPARISON:  04/08/2020 FINDINGS: There is no evidence of hip fracture or dislocation. There is no evidence of arthropathy or other focal bone abnormality. IMPRESSION: Negative. Electronically Signed   By: Rolm Baptise M.D.   On: 09/20/2020 22:42    ED COURSE and MDM  Nursing notes, initial and subsequent vitals signs, including pulse oximetry, reviewed and interpreted by myself.  Vitals:   09/20/20 2141 09/20/20 2349  BP: (!) 140/95 (!) 165/78  Pulse: 91 80  Resp: 16 15  Temp: 98.7 F (37.1 C)   SpO2: 98% 100%   Medications - No data to display  No evidence of fracture on plain films.  The patient declined analgesics.  He was advised to follow-up with Dr. Wynelle Link if pain in the left hip persists.  PROCEDURES  Procedures   ED DIAGNOSES     ICD-10-CM   1. Fall in home, initial encounter  W19.XXXA    Y92.009   2. Traumatic hematoma of left thigh, initial encounter  S70.12XA   3. Traumatic hematoma of flank, initial encounter  S30.1XXA   4. Pain due to left hip joint prosthesis, initial encounter [facility])  T84.84XA    Y85.027        Shanon Rosser, MD 09/21/20 0002

## 2020-09-20 NOTE — ED Triage Notes (Signed)
Patient here from home reporting fall today. Walks with walker. Pain to left and right hip radiating down leg. States that he had a hip replacement to left hip recently.

## 2020-09-24 DIAGNOSIS — M25512 Pain in left shoulder: Secondary | ICD-10-CM | POA: Diagnosis not present

## 2020-09-24 DIAGNOSIS — S43102D Unspecified dislocation of left acromioclavicular joint, subsequent encounter: Secondary | ICD-10-CM | POA: Diagnosis not present

## 2020-09-24 DIAGNOSIS — M4722 Other spondylosis with radiculopathy, cervical region: Secondary | ICD-10-CM | POA: Diagnosis not present

## 2020-09-24 DIAGNOSIS — G8929 Other chronic pain: Secondary | ICD-10-CM | POA: Diagnosis not present

## 2020-09-30 ENCOUNTER — Ambulatory Visit (HOSPITAL_COMMUNITY): Payer: Medicare Other

## 2020-10-03 ENCOUNTER — Ambulatory Visit (HOSPITAL_COMMUNITY)
Admission: RE | Admit: 2020-10-03 | Discharge: 2020-10-03 | Disposition: A | Discharge status: Home / Self Care | Payer: Medicare Other | Source: Ambulatory Visit | Attending: Gastroenterology | Admitting: Gastroenterology

## 2020-10-03 ENCOUNTER — Other Ambulatory Visit: Payer: Self-pay

## 2020-10-03 DIAGNOSIS — K529 Noninfective gastroenteritis and colitis, unspecified: Secondary | ICD-10-CM

## 2020-10-03 DIAGNOSIS — K3189 Other diseases of stomach and duodenum: Secondary | ICD-10-CM | POA: Diagnosis not present

## 2020-10-03 DIAGNOSIS — K6389 Other specified diseases of intestine: Secondary | ICD-10-CM | POA: Diagnosis not present

## 2020-10-03 DIAGNOSIS — K5730 Diverticulosis of large intestine without perforation or abscess without bleeding: Secondary | ICD-10-CM | POA: Diagnosis not present

## 2020-10-03 DIAGNOSIS — N281 Cyst of kidney, acquired: Secondary | ICD-10-CM | POA: Diagnosis not present

## 2020-10-03 IMAGING — MR MR [PERSON_NAME] PELVIS W/CM
21 series · 48 of 48 positions shown · IV contrast (10 ml Gadavist)
Comparison: CT [DATE]

CLINICAL DATA: Concern for Crohn's disease. Digestive system
problems 1 year. 74-year-old.

EXAM:
MR ABDOMEN AND PELVIS WITHOUT AND WITH CONTRAST (MR ENTEROGRAPHY)
TECHNIQUE: Multiplanar, multisequence MRI of the abdomen and pelvis was
performed both before and during bolus administration of intravenous
contrast. Negative oral contrast VoLumen was given.
CONTRAST:  10mL GADAVIST GADOBUTROL 1 MMOL/ML IV SOLN

[Series 5: cor haste · coronal · 6.0mm · 1.56mm/px · 1 of 43 slices shown]
[im 1/43]
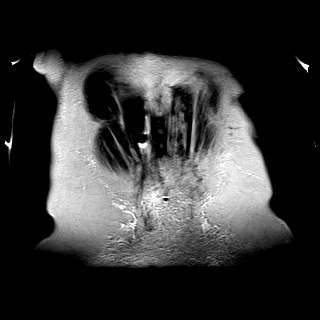

[Series 8: ax haste_comp · axial · 5.0mm · 1.41mm/px · z∈[-376,+110]mm · 2 of 82 slices shown]
[im 1/82]
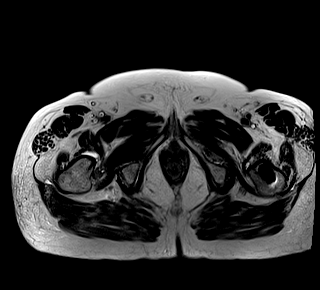
[im 82/82]
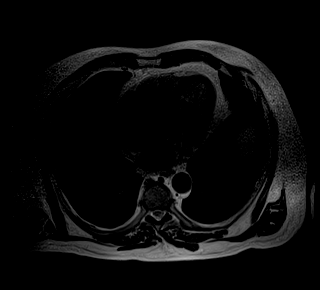

[Series 9: DWI · axial · 6.0mm · 1.68mm/px · 1 of 40 slices shown (1 of 8)]
[im 1/40]
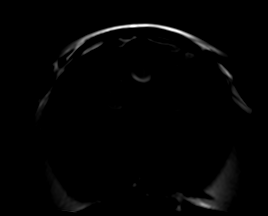

[Series 9: DWI · axial · 6.0mm · 1.68mm/px · 1 of 40 slices shown (2 of 8)]
[im 1/40]
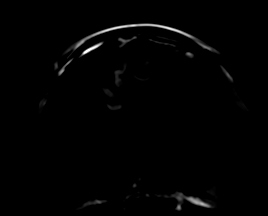

[Series 9: DWI · axial · 6.0mm · 1.68mm/px · 1 of 40 slices shown (3 of 8)]
[im 1/40]
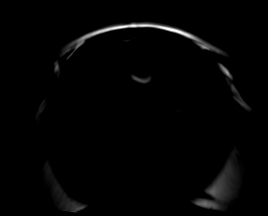

[Series 10: DWI · axial · 6.0mm · 1.68mm/px · 1 of 40 slices shown (4 of 8)]
[im 1/40]
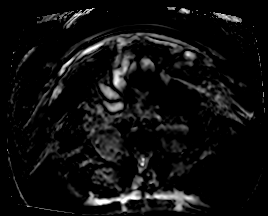

[Series 11: DWI · axial · 6.0mm · 1.68mm/px · 1 of 30 slices shown (5 of 8)]
[im 1/30]
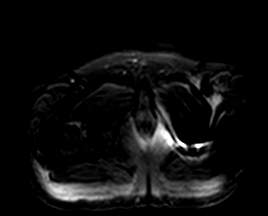

[Series 11: DWI · axial · 6.0mm · 1.68mm/px · 1 of 30 slices shown (6 of 8)]
[im 1/30]
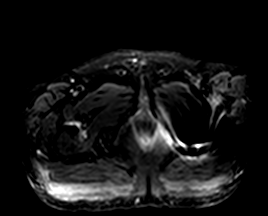

[Series 11: DWI · axial · 6.0mm · 1.68mm/px · 1 of 30 slices shown (7 of 8)]
[im 1/30]
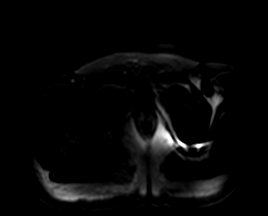

[Series 12: DWI · axial · 6.0mm · 1.68mm/px · 1 of 30 slices shown (8 of 8)]
[im 1/30]
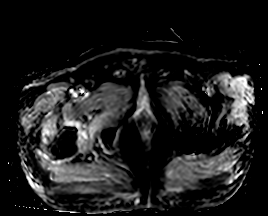

[Series 13: bSSFP · coronal · 6.0mm · 0.98mm/px · 1 of 43 slices shown]
[im 1/43]
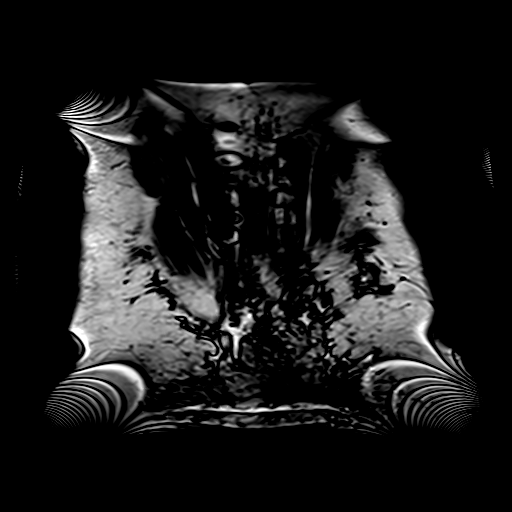

[Series 14: t1_vibe_fs_tra_p4_bh_pre · axial · 3.9mm · 1.41mm/px · z∈[-344,+89]mm · 3 of 112 slices shown]
[im 1/112]
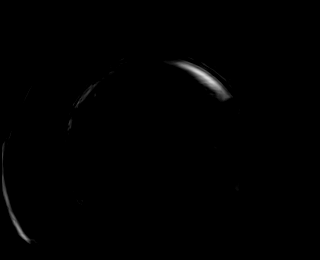
[im 56/112]
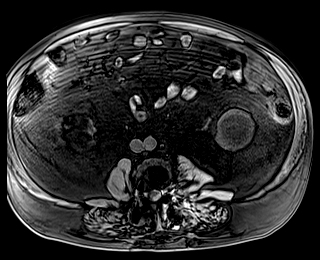
[im 112/112]
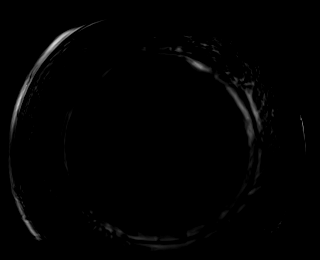

[Series 16: t1_vibe_fs_tra_p4_bh_post · axial · 3.9mm · 1.41mm/px · z∈[-344,+89]mm · 3 of 112 slices shown (1 of 4)]
[im 1/112]
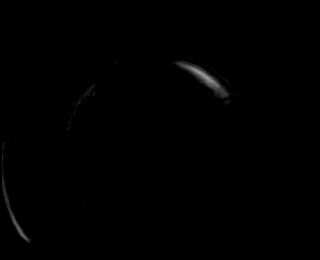
[im 56/112]
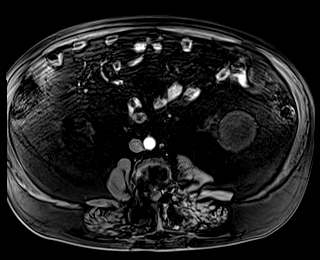
[im 112/112]
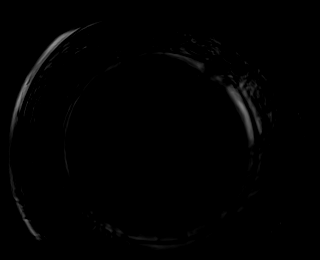

[Series 17: t1_vibe_fs_tra_p4_bh_post_sub · axial · 3.9mm · 1.41mm/px · z∈[-344,+89]mm · 4 of 112 slices shown (1 of 4)]
[im 1/112]
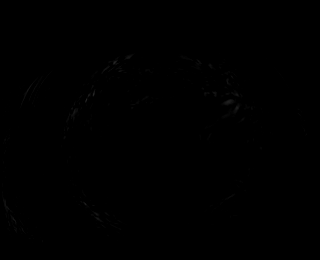
[im 38/112]
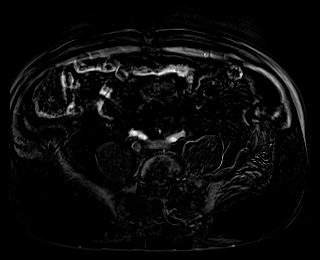
[im 75/112]
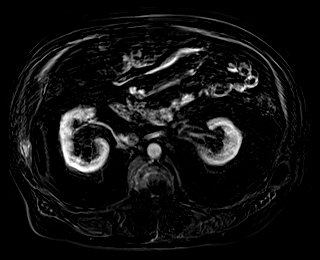
[im 112/112]
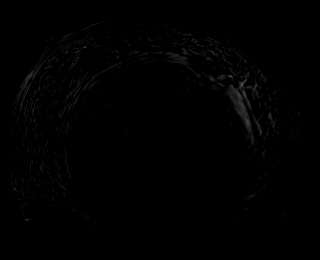

[Series 18: t1_vibe_fs_tra_p4_bh_post · axial · 3.9mm · 1.41mm/px · z∈[-344,+89]mm · 4 of 112 slices shown (2 of 4)]
[im 1/112]
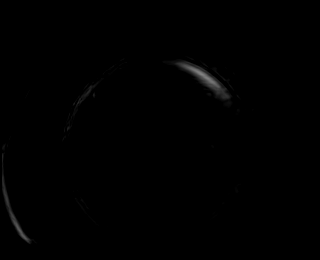
[im 38/112]
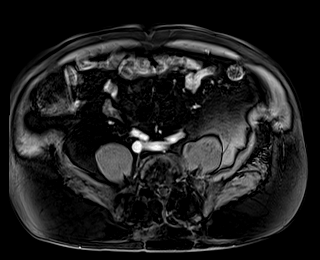
[im 75/112]
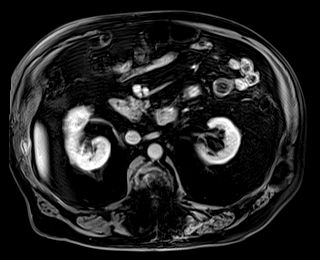
[im 112/112]
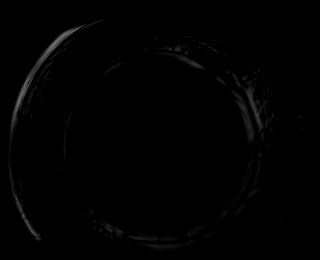

[Series 19: t1_vibe_fs_tra_p4_bh_post_sub · axial · 3.9mm · 1.41mm/px · z∈[-344,+89]mm · 4 of 112 slices shown (2 of 4)]
[im 1/112]
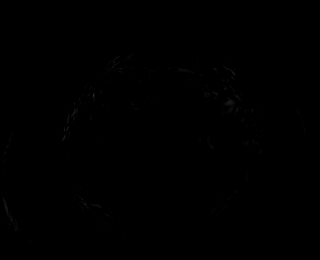
[im 38/112]
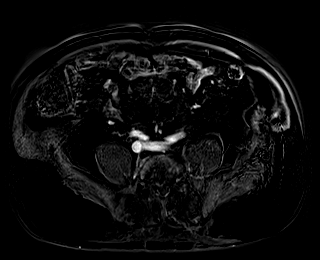
[im 75/112]
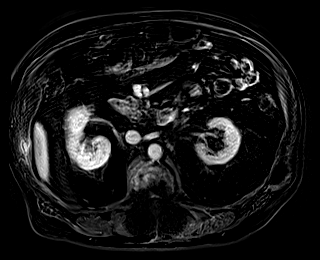
[im 112/112]
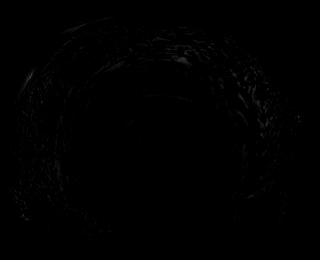

[Series 20: t1_vibe_fs_tra_p4_bh_post · axial · 3.9mm · 1.41mm/px · z∈[-344,+89]mm · 4 of 112 slices shown (3 of 4)]
[im 1/112]
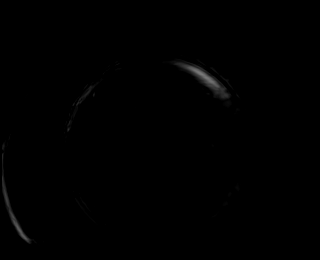
[im 38/112]
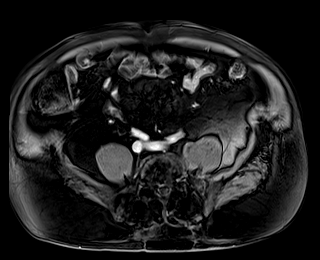
[im 75/112]
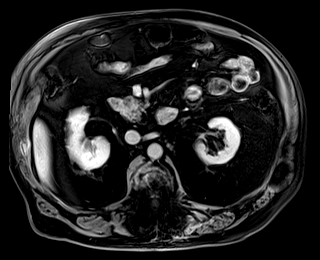
[im 112/112]
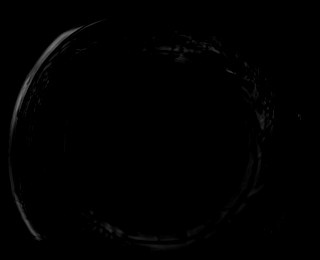

[Series 21: t1_vibe_fs_tra_p4_bh_post_sub · axial · 3.9mm · 1.41mm/px · z∈[-344,+89]mm · 4 of 112 slices shown (3 of 4)]
[im 1/112]
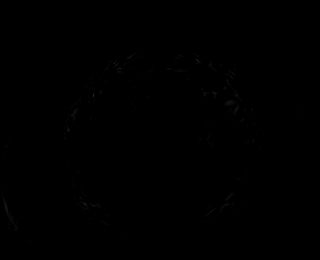
[im 38/112]
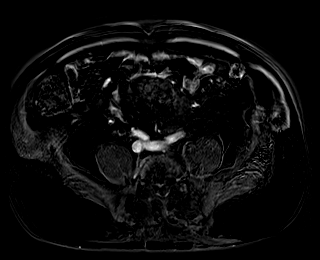
[im 75/112]
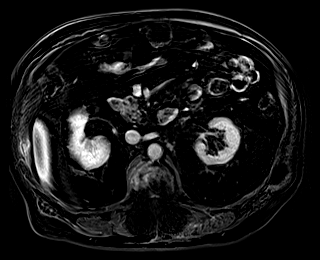
[im 112/112]
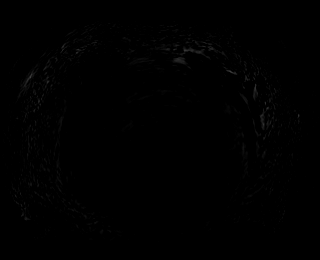

[Series 22: t1_vibe_fs_tra_p4_bh_post · axial · 3.9mm · 1.41mm/px · z∈[-344,+89]mm · 4 of 112 slices shown (4 of 4)]
[im 1/112]
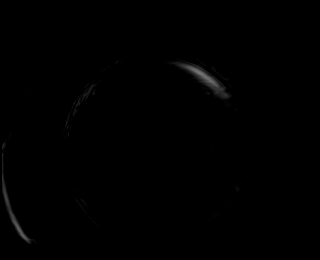
[im 38/112]
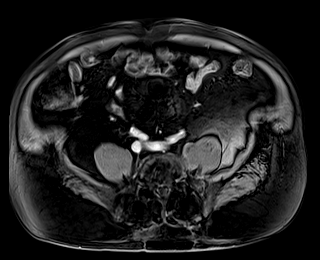
[im 75/112]
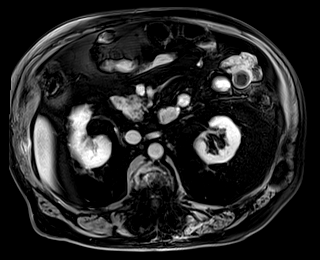
[im 112/112]
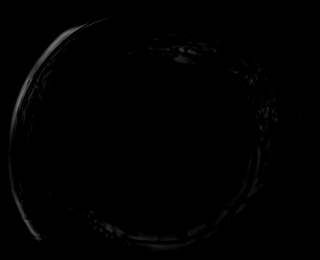

[Series 23: t1_vibe_fs_tra_p4_bh_post_sub · axial · 3.9mm · 1.41mm/px · z∈[-344,+89]mm · 4 of 112 slices shown (4 of 4)]
[im 1/112]
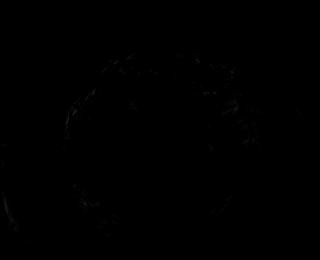
[im 38/112]
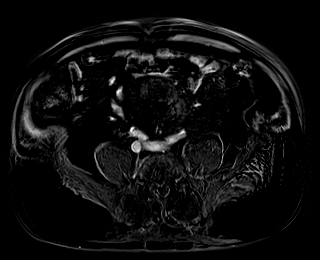
[im 75/112]
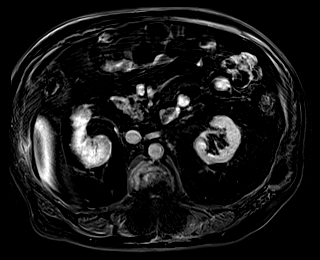
[im 112/112]
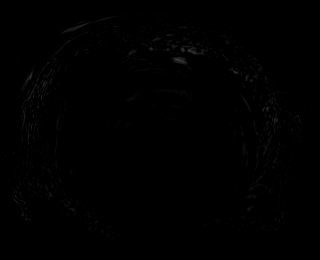

[Series 24: T1 dynamic post-contrast · coronal · 4.0mm · 1.56mm/px · 2 of 80 slices shown]
[im 1/80]
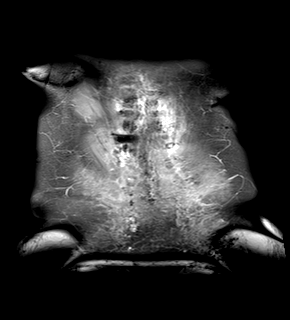
[im 80/80]
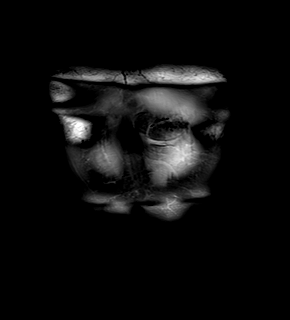

[48 of 48 positions shown; findings below may reference images not displayed]

FINDINGS: COMBINED FINDINGS FOR BOTH MR ABDOMEN AND PELVIS

Lower chest: Lung bases are clear.

Hepatobiliary: No focal hepatic lesion. Normal gallbladder. No
biliary duct dilatation. Common bile duct is normal.

Pancreas: Pancreas is normal. No ductal dilatation. No pancreatic
inflammation.

Spleen: Normal spleen

Adrenals/urinary tract: Adrenal glands and kidneys are normal. Large
nonenhancing cyst of the LEFT kidney. Small nonenhancing cyst of
the RIGHT kidney. The ureters and bladder normal.

Stomach/Bowel: GI track is distended by the oral contrast. Stomach
appears normal without mucosal inflammation. Duodenum normal.

Normal mucosal pattern of the jejunum. No dilatation or stricturing.

There is no dilatation the ileum or terminal ileum. No high-grade
stricturing. No abnormal mucosal enhancement.

There are again demonstrated 3 regions mucosal thickening and
luminal narrowing which are nearly identical to CT scan from
[DATE]. For example 5 cm segment circumferential mucosal
thickening in the distal ileum seen on coronal image 33/5
demonstrates mucosal thickening to 9 mm. While the lumen is mildly
narrowed, there is no significant pre stenotic bowel dilatation. No
evidence acute inflammation on T2 weighted imaging or postcontrast
T1 weighted imaging.

This region near identical to CT image 37/series 5 on [DATE].

Similarly, segment mucosal thickening and luminal narrowing in the
more distal ileum seen on coronal image 37/5 compares to CT
image/series [DATE].

Terminal ileum itself appears normal. There is no stricturing. No
evidence of fistula or abscess.

Ascending, transverse and descending colon appear normal.
Rectosigmoid colon. Scattered diverticula through the sigmoid colon
without acute inflammation

Vascular/Lymphatic: Abdominal aorta is normal caliber. No periportal
or retroperitoneal adenopathy. No pelvic adenopathy.

Reproductive: Unremarkable

Other: No free fluid.

Musculoskeletal: No aggressive osseous lesion.
IMPRESSION: 1. Three discrete segments of mucosal thickening and luminal
narrowing in the distal ileum are nearly identical to CT from
[DATE]. No evidence of acute inflammation. Finding suggest
sequelae of prior infection or inflammation.
2. Terminal ileum itself appears normal.
3. No evidence of inflammation in the stomach, duodenum, jejunum or
colon.
4. Sigmoid diverticulosis evidence evidence diverticulitis.

## 2020-10-03 IMAGING — MR MR [PERSON_NAME] ABD W/CM
21 series · 48 of 48 positions shown · IV contrast (10 ml Gadavist)
Comparison: CT [DATE]

CLINICAL DATA: Concern for Crohn's disease. Digestive system
problems 1 year. 74-year-old.

EXAM:
MR ABDOMEN AND PELVIS WITHOUT AND WITH CONTRAST (MR ENTEROGRAPHY)
TECHNIQUE: Multiplanar, multisequence MRI of the abdomen and pelvis was
performed both before and during bolus administration of intravenous
contrast. Negative oral contrast VoLumen was given.
CONTRAST:  10mL GADAVIST GADOBUTROL 1 MMOL/ML IV SOLN

[Series 5: cor haste · coronal · 6.0mm · 1.56mm/px · 1 of 43 slices shown]
[im 1/43]
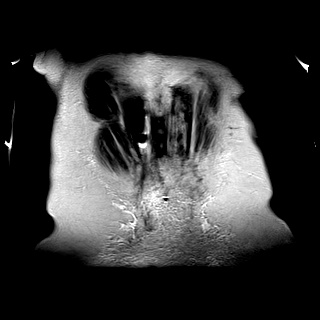

[Series 8: ax haste_comp · axial · 5.0mm · 1.41mm/px · z∈[-376,+110]mm · 2 of 82 slices shown]
[im 1/82]
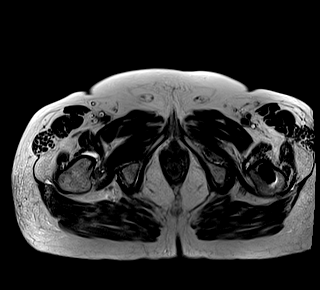
[im 82/82]
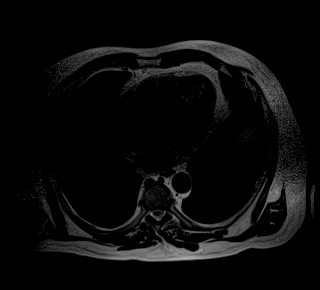

[Series 9: DWI · axial · 6.0mm · 1.68mm/px · 1 of 40 slices shown (1 of 8)]
[im 1/40]
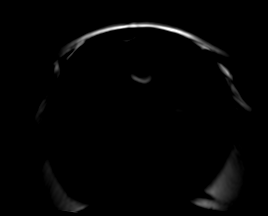

[Series 9: DWI · axial · 6.0mm · 1.68mm/px · 1 of 40 slices shown (2 of 8)]
[im 1/40]
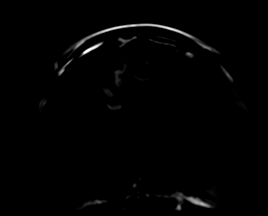

[Series 9: DWI · axial · 6.0mm · 1.68mm/px · 1 of 40 slices shown (3 of 8)]
[im 1/40]
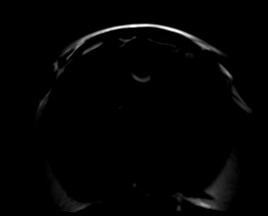

[Series 10: DWI · axial · 6.0mm · 1.68mm/px · 1 of 40 slices shown (4 of 8)]
[im 1/40]
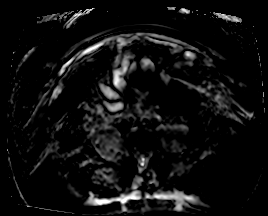

[Series 11: DWI · axial · 6.0mm · 1.68mm/px · 1 of 30 slices shown (5 of 8)]
[im 1/30]
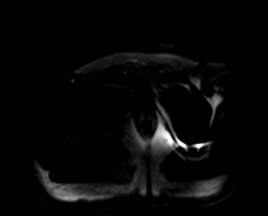

[Series 11: DWI · axial · 6.0mm · 1.68mm/px · 1 of 30 slices shown (6 of 8)]
[im 1/30]
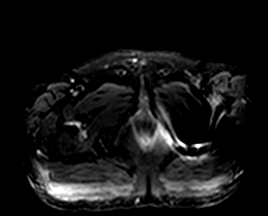

[Series 11: DWI · axial · 6.0mm · 1.68mm/px · 1 of 30 slices shown (7 of 8)]
[im 1/30]
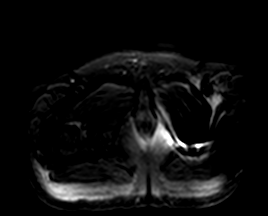

[Series 12: DWI · axial · 6.0mm · 1.68mm/px · 1 of 30 slices shown (8 of 8)]
[im 1/30]
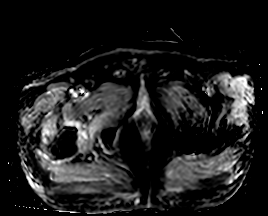

[Series 13: bSSFP · coronal · 6.0mm · 0.98mm/px · 1 of 43 slices shown]
[im 1/43]
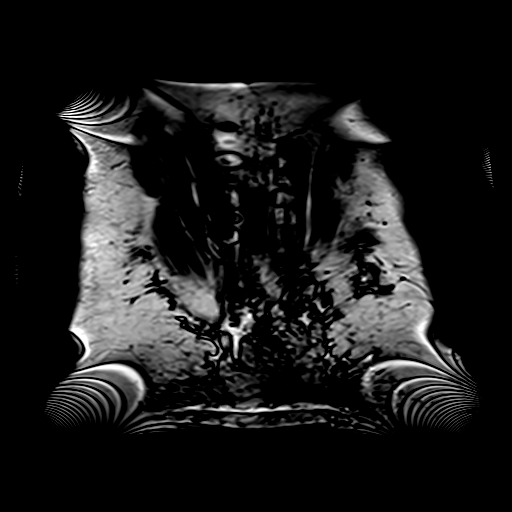

[Series 14: t1_vibe_fs_tra_p4_bh_pre · axial · 3.9mm · 1.41mm/px · z∈[-344,+89]mm · 3 of 112 slices shown]
[im 1/112]
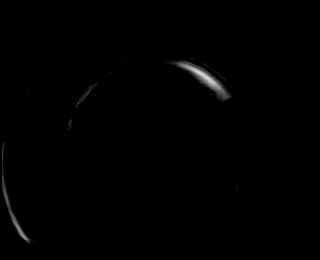
[im 56/112]
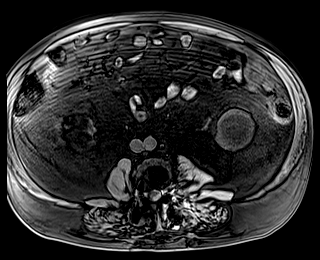
[im 112/112]
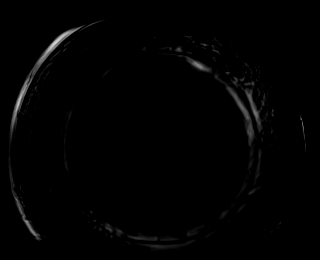

[Series 16: t1_vibe_fs_tra_p4_bh_post · axial · 3.9mm · 1.41mm/px · z∈[-344,+89]mm · 3 of 112 slices shown (1 of 4)]
[im 1/112]
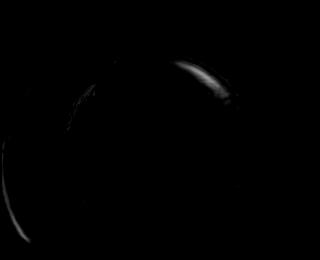
[im 56/112]
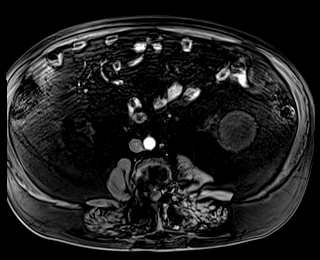
[im 112/112]
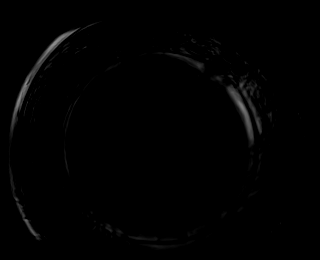

[Series 17: t1_vibe_fs_tra_p4_bh_post_sub · axial · 3.9mm · 1.41mm/px · z∈[-344,+89]mm · 4 of 112 slices shown (1 of 4)]
[im 1/112]
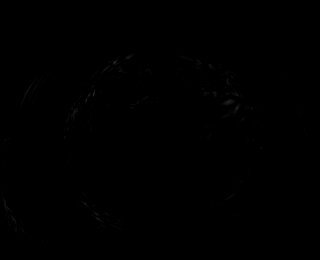
[im 38/112]
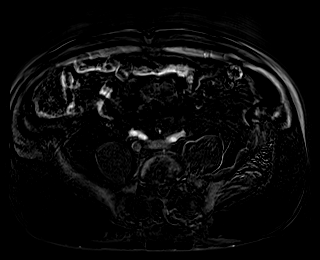
[im 75/112]
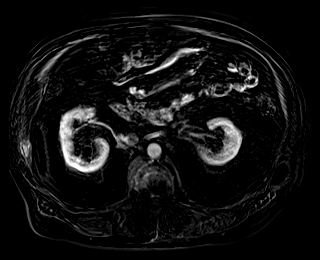
[im 112/112]
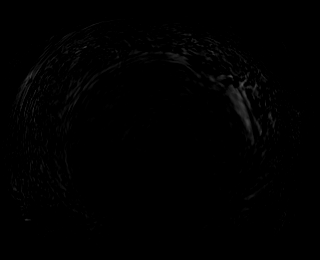

[Series 18: t1_vibe_fs_tra_p4_bh_post · axial · 3.9mm · 1.41mm/px · z∈[-344,+89]mm · 4 of 112 slices shown (2 of 4)]
[im 1/112]
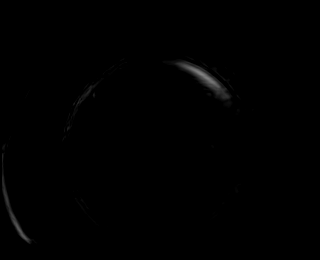
[im 38/112]
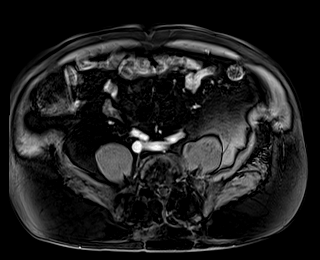
[im 75/112]
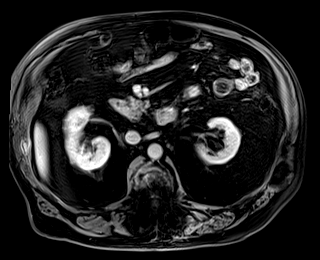
[im 112/112]
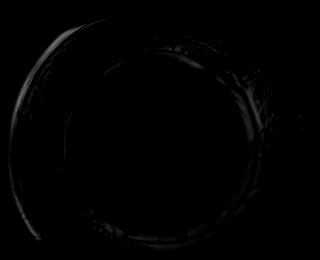

[Series 19: t1_vibe_fs_tra_p4_bh_post_sub · axial · 3.9mm · 1.41mm/px · z∈[-344,+89]mm · 4 of 112 slices shown (2 of 4)]
[im 1/112]
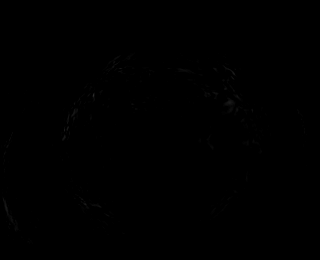
[im 38/112]
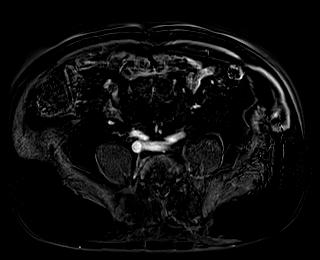
[im 75/112]
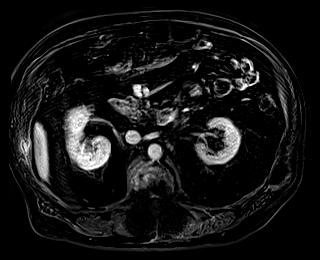
[im 112/112]
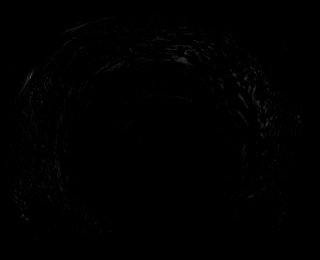

[Series 20: t1_vibe_fs_tra_p4_bh_post · axial · 3.9mm · 1.41mm/px · z∈[-344,+89]mm · 4 of 112 slices shown (3 of 4)]
[im 1/112]
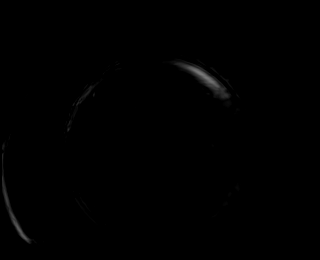
[im 38/112]
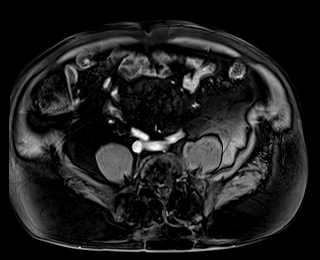
[im 75/112]
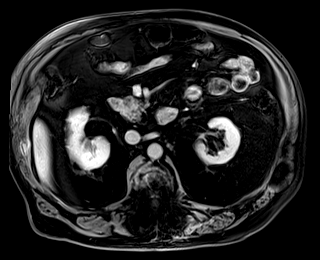
[im 112/112]
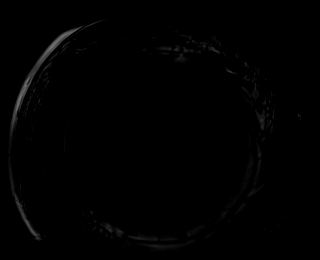

[Series 21: t1_vibe_fs_tra_p4_bh_post_sub · axial · 3.9mm · 1.41mm/px · z∈[-344,+89]mm · 4 of 112 slices shown (3 of 4)]
[im 1/112]
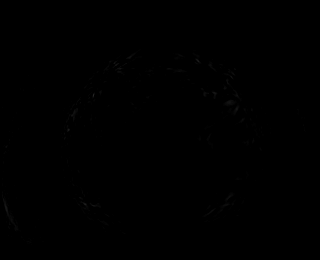
[im 38/112]
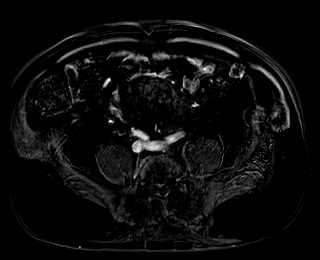
[im 75/112]
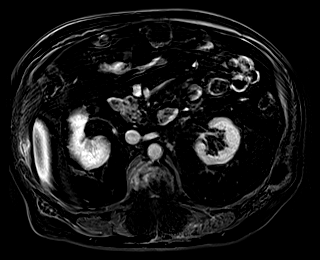
[im 112/112]
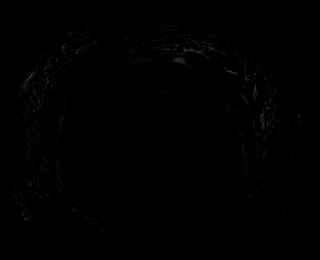

[Series 22: t1_vibe_fs_tra_p4_bh_post · axial · 3.9mm · 1.41mm/px · z∈[-344,+89]mm · 4 of 112 slices shown (4 of 4)]
[im 1/112]
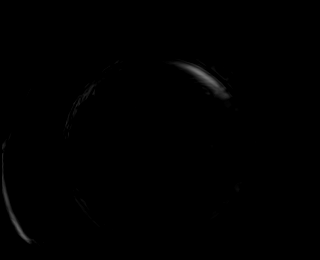
[im 38/112]
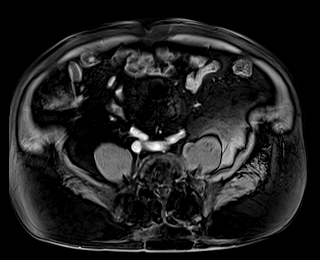
[im 75/112]
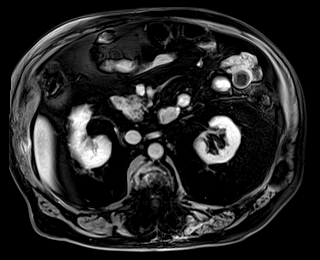
[im 112/112]
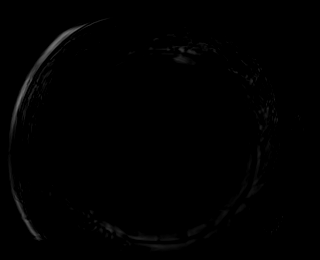

[Series 23: t1_vibe_fs_tra_p4_bh_post_sub · axial · 3.9mm · 1.41mm/px · z∈[-344,+89]mm · 4 of 112 slices shown (4 of 4)]
[im 1/112]
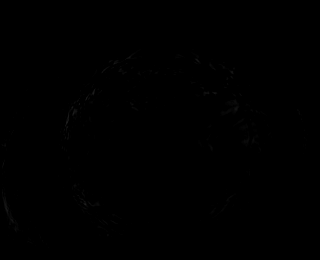
[im 38/112]
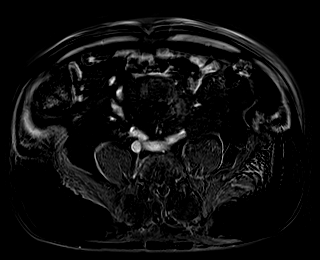
[im 75/112]
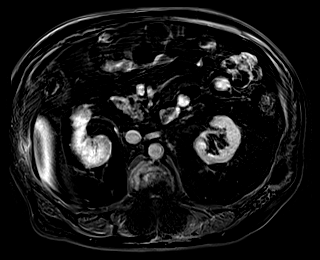
[im 112/112]
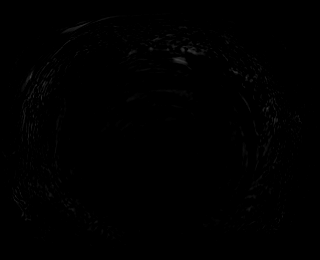

[Series 24: T1 dynamic post-contrast · coronal · 4.0mm · 1.56mm/px · 2 of 80 slices shown]
[im 1/80]
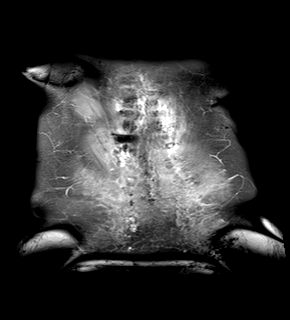
[im 80/80]
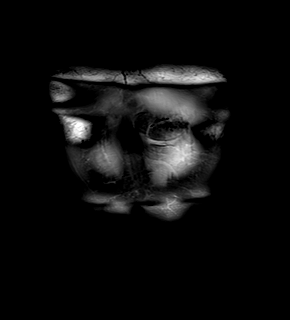

[48 of 48 positions shown; findings below may reference images not displayed]

FINDINGS: COMBINED FINDINGS FOR BOTH MR ABDOMEN AND PELVIS

Lower chest: Lung bases are clear.

Hepatobiliary: No focal hepatic lesion. Normal gallbladder. No
biliary duct dilatation. Common bile duct is normal.

Pancreas: Pancreas is normal. No ductal dilatation. No pancreatic
inflammation.

Spleen: Normal spleen

Adrenals/urinary tract: Adrenal glands and kidneys are normal. Large
nonenhancing cyst of the LEFT kidney. Small nonenhancing cyst of
the RIGHT kidney. The ureters and bladder normal.

Stomach/Bowel: GI track is distended by the oral contrast. Stomach
appears normal without mucosal inflammation. Duodenum normal.

Normal mucosal pattern of the jejunum. No dilatation or stricturing.

There is no dilatation the ileum or terminal ileum. No high-grade
stricturing. No abnormal mucosal enhancement.

There are again demonstrated 3 regions mucosal thickening and
luminal narrowing which are nearly identical to CT scan from
[DATE]. For example 5 cm segment circumferential mucosal
thickening in the distal ileum seen on coronal image 33/5
demonstrates mucosal thickening to 9 mm. While the lumen is mildly
narrowed, there is no significant pre stenotic bowel dilatation. No
evidence acute inflammation on T2 weighted imaging or postcontrast
T1 weighted imaging.

This region near identical to CT image 37/series 5 on [DATE].

Similarly, segment mucosal thickening and luminal narrowing in the
more distal ileum seen on coronal image 37/5 compares to CT
image/series [DATE].

Terminal ileum itself appears normal. There is no stricturing. No
evidence of fistula or abscess.

Ascending, transverse and descending colon appear normal.
Rectosigmoid colon. Scattered diverticula through the sigmoid colon
without acute inflammation

Vascular/Lymphatic: Abdominal aorta is normal caliber. No periportal
or retroperitoneal adenopathy. No pelvic adenopathy.

Reproductive: Unremarkable

Other: No free fluid.

Musculoskeletal: No aggressive osseous lesion.
IMPRESSION: 1. Three discrete segments of mucosal thickening and luminal
narrowing in the distal ileum are nearly identical to CT from
[DATE]. No evidence of acute inflammation. Finding suggest
sequelae of prior infection or inflammation.
2. Terminal ileum itself appears normal.
3. No evidence of inflammation in the stomach, duodenum, jejunum or
colon.
4. Sigmoid diverticulosis evidence evidence diverticulitis.

## 2020-10-03 MED ORDER — GLUCAGON HCL RDNA (DIAGNOSTIC) 1 MG IJ SOLR: 1.0000 mg | Freq: Once | INTRAMUSCULAR | Status: AC

## 2020-10-03 MED ORDER — GADOBUTROL 1 MMOL/ML IV SOLN
10.0000 mL | Freq: Once | INTRAVENOUS | Status: AC | PRN
  Administered 2020-10-03: 10 mL via INTRAVENOUS

## 2020-10-03 MED ORDER — GLUCAGON HCL RDNA (DIAGNOSTIC) 1 MG IJ SOLR
INTRAMUSCULAR | Status: AC
  Administered 2020-10-03: 1 mg via INTRAVENOUS
  Filled 2020-10-03: qty 1

## 2020-10-07 DIAGNOSIS — I25118 Atherosclerotic heart disease of native coronary artery with other forms of angina pectoris: Secondary | ICD-10-CM | POA: Diagnosis not present

## 2020-10-07 DIAGNOSIS — I10 Essential (primary) hypertension: Secondary | ICD-10-CM | POA: Diagnosis not present

## 2020-10-09 ENCOUNTER — Ambulatory Visit (HOSPITAL_COMMUNITY): Payer: Medicare Other

## 2020-10-31 ENCOUNTER — Ambulatory Visit (INDEPENDENT_AMBULATORY_CARE_PROVIDER_SITE_OTHER): Payer: Medicare Other | Admitting: Internal Medicine

## 2020-10-31 ENCOUNTER — Encounter (INDEPENDENT_AMBULATORY_CARE_PROVIDER_SITE_OTHER): Payer: Self-pay | Admitting: Internal Medicine

## 2020-10-31 ENCOUNTER — Other Ambulatory Visit: Payer: Self-pay

## 2020-10-31 VITALS — BP 149/73 | HR 82 | Temp 98.7°F | Ht 76.0 in | Wt 289.5 lb

## 2020-10-31 DIAGNOSIS — D500 Iron deficiency anemia secondary to blood loss (chronic): Secondary | ICD-10-CM

## 2020-10-31 DIAGNOSIS — K529 Noninfective gastroenteritis and colitis, unspecified: Secondary | ICD-10-CM | POA: Diagnosis not present

## 2020-10-31 MED ORDER — BUDESONIDE 3 MG PO CPEP: 9.0000 mg | ORAL_CAPSULE | Freq: Every day | ORAL | 2 refills | Status: DC

## 2020-10-31 NOTE — Patient Instructions (Addendum)
CBC to be done with your next blood work in January 2022. Plan is to continue budesonide 9 mg by mouth every morning for 1 month followed by 6 mg every morning for 1 month followed by 3 mg every morning and stop. Hemoccult x1.

## 2020-10-31 NOTE — Progress Notes (Signed)
Presenting complaint;  Follow-up for abdominal pain and anemia. History of ileitis.  Database and subjective:  Patient is 74 year old Caucasian male who is here for scheduled visit accompanied by his wife to go over his symptoms and recent evaluation that he has undergone for abdominal pain and ileitis. Back in April 2019 he presented with 20-monthhistory of right lower quadrant abdominal pain with diarrhea.  He was found to have ileal ulcers.  Biopsy revealed nonspecific ulceration.  GI pathogen panel was negative.  CRP was normal.  Ileal ulceration felt to be secondary to NSAID therapy. He presented again 4 months ago with anemia and heme positive stool.  He was taking NSAID and was on Brilinta.  He underwent EGD and colonoscopy. EGD revealed gastritis but H. pylori stains were negative.  He had 2 gastric polyps and biopsy revealed fundic gland polyps.  Colonoscopy revealed multiple ileal ulcers sigmoid colon diverticulosis and external hemorrhoids.  Biopsy revealed chronic active ileitis but no granulomas were seen. He was further evaluated with abdominal pelvic CT on 06/19/2020 revealing segmental wall thickening to distal ileum similar to the [facility]  There was also intramural fatty deposition suggesting chronic inflammation.  He also had colonic diverticulosis nonobstructive right renal calculus and left posterolateral abdominal wall hernia containing fat. He saw Dr. CJenetta Downeron 09/12/2020 and underwent MR enterography This study revealed 3 discrete segment submucosal thickening and luminal narrowing involving the distal ileum identical to CT from March 2019 as well as sigmoid diverticulosis.  Dr. CJenetta Downerfelt that these changes could be due to NSAID use.  Patient remains with abdominal pain in right lower quadrant and left mid abdomen.  Pain occurs with bowel movement and may last for 10 minutes.  It is mild and never severe.  He is having  at least 3-4 episodes every week if not daily.  This pain is not associated with diarrhea melena or rectal bleeding.  He also denies nausea or vomiting.  His appetite is good.  He has gained 10 pounds in the last 7 weeks. He has not taken NSAIDs for almost 5 months.  He takes 1 Tylenol tablet every day. Patient says heartburn is well controlled with pantoprazole. He fell off a chair on 09/20/2020 because he forgot to lock the wheels.  He was evaluated emergency room and no bony injury noted. He is on low-dose aspirin but does not take Brilinta anymore.    Current Medications: Outpatient Encounter Medications as of 10/31/2020  Medication Sig  . acetaminophen (TYLENOL) 500 MG tablet Take 1,000 mg by mouth every 8 (eight) hours as needed for mild pain or moderate pain.  .Marland Kitchenaspirin EC 81 MG tablet Take 81 mg by mouth daily. Swallow whole.  . buprenorphine (BUTRANS) 15 MCG/HR Place 1 patch onto the skin once a week.  . Calcium-Magnesium-Zinc (CAL-MAG-ZINC PO) Take 1 tablet by mouth at bedtime.  . carvedilol (COREG) 6.25 MG tablet Take 6.25 mg by mouth 2 (two) times daily with a meal.   . Cholecalciferol 5000 units TABS Take 5,000 Units by mouth daily.   . cyanocobalamin 1000 MCG tablet Take 1,000 mcg by mouth daily.  . diclofenac Sodium (VOLTAREN) 1 % GEL Apply 1 application topically daily as needed (pain).   .Marland KitchendiphenhydrAMINE-APAP, sleep, (TYLENOL PM EXTRA STRENGTH PO) Take by mouth. Patient reports takes 1 by mouth at night.  . DULoxetine (CYMBALTA) 30 MG capsule Take 30 mg by mouth daily.  . Fish Oil-Cholecalciferol (FISH OIL +  D3 PO) Take 1,400 mg by mouth daily.   Marland Kitchen gabapentin (NEURONTIN) 300 MG capsule Take 300 mg by mouth 2 (two) times daily.   . Melatonin 10 MG TABS Take 10 mg by mouth at bedtime.  . methocarbamol (ROBAXIN) 500 MG tablet Take 1 tablet (500 mg total) by mouth every 6 (six) hours as needed for muscle spasms.  . Multiple Vitamin (MULTIVITAMIN WITH MINERALS) TABS tablet Take  1 tablet by mouth daily.  Marland Kitchen olive oil external oil Apply topically as needed. Patient takes 1 by mouth daily. This is a capsule.  . pantoprazole (PROTONIX) 40 MG tablet Take 1 tablet (40 mg total) by mouth daily. (Patient taking differently: Take 40 mg by mouth 2 (two) times daily.)  . Probiotic Product (PROBIOTIC DAILY) CAPS Take 1 capsule by mouth daily.  . rosuvastatin (CRESTOR) 20 MG tablet Take 20 mg by mouth daily.  . tamsulosin (FLOMAX) 0.4 MG CAPS capsule Take 0.4 mg by mouth in the morning and at bedtime.   . Turmeric (QC TUMERIC COMPLEX PO) Take by mouth daily.  Marland Kitchen tiZANidine (ZANAFLEX) 4 MG capsule Take 4 mg by mouth 3 (three) times daily. (Patient not taking: Reported on 10/31/2020)  . [DISCONTINUED] ticagrelor (BRILINTA) 90 MG TABS tablet Take 1 tablet (90 mg total) by mouth 2 (two) times daily. (Patient not taking: Reported on 10/31/2020)   No facility-administered encounter medications on file as of 10/31/2020.     Objective: Blood pressure (!) 149/73, pulse 82, temperature 98.7 F (37.1 C), temperature source Oral, height 6' 4"  (1.93 m), weight 289 lb 8 oz (131.3 kg). Patient is alert and in no acute distress. Patient is wearing a mask. Conjunctiva is pink. Sclera is nonicteric Oropharyngeal mucosa is normal. No neck masses or thyromegaly noted. Cardiac exam with regular rhythm normal S1 and S2. No murmur or gallop noted. Lungs are clear to auscultation. Abdomen is full.  Bowel sounds are normal.  On palpation abdomen is soft without tenderness organomegaly or masses. No LE edema or clubbing noted.  Labs/studies Results:  CBC Latest Ref Rng & Units 09/12/2020 06/27/2020 06/18/2020  WBC 3.8 - 10.8 Thousand/uL 7.0 10.9(H) 7.5  Hemoglobin 13.2 - 17.1 g/dL 12.1(L) 10.6(L) 12.1(L)  Hematocrit 38.5 - 50.0 % 38.0(L) 33.2(L) 37.4(L)  Platelets 140 - 400 Thousand/uL 268 207 232    CMP Latest Ref Rng & Units 09/12/2020 06/27/2020 06/18/2020  Glucose 65 - 139 mg/dL 124 153(H) 88   BUN 7 - 25 mg/dL 19 18 21   Creatinine 0.70 - 1.18 mg/dL 1.56(H) 1.88(H) 1.44(H)  Sodium 135 - 146 mmol/L 139 132(L) 138  Potassium 3.5 - 5.3 mmol/L 4.9 4.8 4.4  Chloride 98 - 110 mmol/L 102 96(L) 103  CO2 20 - 32 mmol/L 28 24 28   Calcium 8.6 - 10.3 mg/dL 9.3 8.2(L) 8.8(L)  Total Protein 6.1 - 8.1 g/dL 6.0(L) - 6.1(L)  Total Bilirubin 0.2 - 1.2 mg/dL 0.3 - 0.3  Alkaline Phos 38 - 126 U/L - - 49  AST 10 - 35 U/L 18 - 20  ALT 9 - 46 U/L 13 - 17    Hepatic Function Latest Ref Rng & Units 09/12/2020 06/18/2020 12/21/2017  Total Protein 6.1 - 8.1 g/dL 6.0(L) 6.1(L) 5.8(L)  Albumin 3.5 - 5.0 g/dL - 3.8 -  AST 10 - 35 U/L 18 20 25   ALT 9 - 46 U/L 13 17 35  Alk Phosphatase 38 - 126 U/L - 49 -  Total Bilirubin 0.2 - 1.2 mg/dL 0.3 0.3 0.4  Lab Results  Component Value Date   CRP 2.4 09/12/2020    Abdominal pelvic CT from 06/19/2020 and MR enterography from 10/04/2020 reviewed  Assessment:  #1.  Ileitis.  He was initially discovered to have ileitis back in April 2019 documented on CT and ileocolonoscopy.  Ileitis was still documented on colonoscopy of August 2021 actually appeared to be more pronounced.  Biopsies on both occasions have been negative for granulomas.  Ileitis presumed to be due to NSAID therapy.  However he has not taken NSAID and almost 5 months and he is still having pain.  He has segmental involvement which makes me think that he has low-grade or indolent Crohn's disease.  His disease is felt to be mild and therefore no indication for Biologics.  Will treat him with budesonide for 12 weeks or so and if he responds to therapy may consider mesalamine which is bioavailable and terminal ileum.  If he does not respond to therapy would consider small bowel given capsule study to reevaluate small bowel.  #2.  Anemia felt to be due to chronic blood loss.  His H&H is coming up.  Will recheck stools.  #3.  Patient is high risk for colorectal carcinoma.  His father in his 64s at the time  of diagnosis.  His next colonoscopy would be in August 2025.   Plan:  Hemoccult x1. He will have CBC along with his other blood work next month. Budesonide 9 mg p.o. daily for 4 weeks followed by 6 mg daily for 4 weeks followed by 3 mg daily for 4 weeks. Patient will continue to refrain from using NSAIDs. Will review imaging studies with Dr. Thornton Papas. Office visit in 3 months.

## 2020-11-07 DIAGNOSIS — M25512 Pain in left shoulder: Secondary | ICD-10-CM | POA: Diagnosis not present

## 2020-11-07 DIAGNOSIS — G8929 Other chronic pain: Secondary | ICD-10-CM | POA: Diagnosis not present

## 2020-11-12 DIAGNOSIS — R7301 Impaired fasting glucose: Secondary | ICD-10-CM | POA: Diagnosis not present

## 2020-11-12 DIAGNOSIS — N183 Chronic kidney disease, stage 3 unspecified: Secondary | ICD-10-CM | POA: Diagnosis not present

## 2020-11-12 DIAGNOSIS — E7800 Pure hypercholesterolemia, unspecified: Secondary | ICD-10-CM | POA: Diagnosis not present

## 2020-11-15 DIAGNOSIS — N644 Mastodynia: Secondary | ICD-10-CM | POA: Diagnosis not present

## 2020-11-15 DIAGNOSIS — D649 Anemia, unspecified: Secondary | ICD-10-CM | POA: Diagnosis not present

## 2020-11-15 DIAGNOSIS — K7581 Nonalcoholic steatohepatitis (NASH): Secondary | ICD-10-CM | POA: Diagnosis not present

## 2020-11-15 DIAGNOSIS — M5430 Sciatica, unspecified side: Secondary | ICD-10-CM | POA: Diagnosis not present

## 2020-11-21 ENCOUNTER — Telehealth (INDEPENDENT_AMBULATORY_CARE_PROVIDER_SITE_OTHER): Payer: Self-pay | Admitting: Internal Medicine

## 2020-11-21 NOTE — Telephone Encounter (Signed)
Patient left voice mail message stating he is trying to get a refill on his Budesonide - states pharmacy needs authorization from Dr Laural Golden - states someone needs to call the [facility] also regarding this refill - patient ph# [phone]

## 2020-11-22 ENCOUNTER — Other Ambulatory Visit (INDEPENDENT_AMBULATORY_CARE_PROVIDER_SITE_OTHER): Payer: Self-pay

## 2020-11-22 DIAGNOSIS — K633 Ulcer of intestine: Secondary | ICD-10-CM

## 2020-11-22 DIAGNOSIS — R195 Other fecal abnormalities: Secondary | ICD-10-CM

## 2020-11-22 DIAGNOSIS — K529 Noninfective gastroenteritis and colitis, unspecified: Secondary | ICD-10-CM

## 2020-11-22 DIAGNOSIS — R112 Nausea with vomiting, unspecified: Secondary | ICD-10-CM

## 2020-11-22 DIAGNOSIS — Z7901 Long term (current) use of anticoagulants: Secondary | ICD-10-CM

## 2020-11-22 DIAGNOSIS — D500 Iron deficiency anemia secondary to blood loss (chronic): Secondary | ICD-10-CM

## 2020-11-22 MED ORDER — BUDESONIDE 3 MG PO CPEP: ORAL_CAPSULE | ORAL | 0 refills | Status: DC

## 2020-11-22 NOTE — Telephone Encounter (Addendum)
Per [facility] go ahead and renew the prescription. I have sent this in to the requested pharmacy and patient is aware.  I spoke with the patient and he states he is currently on the 9 mg of the budesonide, and is needing [place] to send in a rx to [facility] for the tapered dose for 6 mg daily for four weeks,followed by the 3 mg daily for four weeks. He states it was not on the original order so they need an updated rx sent to them stating this please. Thanks so much!

## 2020-12-06 ENCOUNTER — Encounter (INDEPENDENT_AMBULATORY_CARE_PROVIDER_SITE_OTHER): Payer: Self-pay

## 2020-12-10 ENCOUNTER — Telehealth (INDEPENDENT_AMBULATORY_CARE_PROVIDER_SITE_OTHER): Payer: Self-pay | Admitting: Internal Medicine

## 2020-12-10 NOTE — Telephone Encounter (Signed)
Patient would like someone to call him regarding his refill request - ph# [phone]

## 2020-12-10 NOTE — Telephone Encounter (Signed)
Patient was contacting our office to let us know that the [place] will not fill the Budesonide as a [facility] did not prescribe. [facility] has sent [place] a prescription request for this medication, it has been completed and faxed back to [facility]. We did ask that this be addressed urgently. As the patient has only enough medication for tomorrow.  Mr.Boan is aware.

## 2020-12-11 ENCOUNTER — Telehealth (INDEPENDENT_AMBULATORY_CARE_PROVIDER_SITE_OTHER): Payer: Self-pay | Admitting: *Deleted

## 2020-12-11 NOTE — Telephone Encounter (Signed)
Patient called and stated that he had received a call from [facility] asking him to have [place] call him. I called and spoke with a representative , who shared that the patient's medication had been mailed out today. The patient is aware.

## 2020-12-12 ENCOUNTER — Telehealth (INDEPENDENT_AMBULATORY_CARE_PROVIDER_SITE_OTHER): Payer: Self-pay | Admitting: *Deleted

## 2020-12-12 ENCOUNTER — Other Ambulatory Visit: Payer: Self-pay

## 2020-12-12 ENCOUNTER — Ambulatory Visit (INDEPENDENT_AMBULATORY_CARE_PROVIDER_SITE_OTHER): Payer: Self-pay

## 2020-12-12 NOTE — Telephone Encounter (Signed)
   Diagnosis:    Result(s)   Card 1: Positive:  Negative:     Card 2: Positive:  Negative:   Card 3: Positive:  Negative:    Completed by:    HEMOCCULT SENSA DEVELOPER: WKH#:[id number]   EXPIRATION DATE: 2022-06   HEMOCCULT SENSA CARD:  [id number] 1R:   EXPIRATION DATE: 07/22   CARD CONTROL RESULTS:  POSITIVE: Positive NEGATIVE: Negative    ADDITIONAL COMMENTS: Patient was made aware of his results.

## 2020-12-18 ENCOUNTER — Other Ambulatory Visit (INDEPENDENT_AMBULATORY_CARE_PROVIDER_SITE_OTHER): Payer: Self-pay | Admitting: Internal Medicine

## 2020-12-18 DIAGNOSIS — Z7901 Long term (current) use of anticoagulants: Secondary | ICD-10-CM

## 2020-12-18 DIAGNOSIS — R195 Other fecal abnormalities: Secondary | ICD-10-CM

## 2020-12-18 DIAGNOSIS — D500 Iron deficiency anemia secondary to blood loss (chronic): Secondary | ICD-10-CM

## 2020-12-18 DIAGNOSIS — R112 Nausea with vomiting, unspecified: Secondary | ICD-10-CM

## 2020-12-18 DIAGNOSIS — K633 Ulcer of intestine: Secondary | ICD-10-CM

## 2020-12-18 DIAGNOSIS — K529 Noninfective gastroenteritis and colitis, unspecified: Secondary | ICD-10-CM

## 2020-12-18 MED ORDER — BUDESONIDE 3 MG PO CPEP: ORAL_CAPSULE | ORAL | 0 refills | Status: DC

## 2020-12-18 NOTE — Telephone Encounter (Signed)
Printed prescription for budesonide provided to the patient

## 2021-01-06 DIAGNOSIS — E785 Hyperlipidemia, unspecified: Secondary | ICD-10-CM | POA: Diagnosis not present

## 2021-01-06 DIAGNOSIS — R0600 Dyspnea, unspecified: Secondary | ICD-10-CM | POA: Diagnosis not present

## 2021-01-06 DIAGNOSIS — I2510 Atherosclerotic heart disease of native coronary artery without angina pectoris: Secondary | ICD-10-CM | POA: Diagnosis not present

## 2021-01-06 DIAGNOSIS — I10 Essential (primary) hypertension: Secondary | ICD-10-CM | POA: Diagnosis not present

## 2021-01-16 DIAGNOSIS — R0600 Dyspnea, unspecified: Secondary | ICD-10-CM | POA: Diagnosis not present

## 2021-01-16 DIAGNOSIS — I2510 Atherosclerotic heart disease of native coronary artery without angina pectoris: Secondary | ICD-10-CM | POA: Diagnosis not present

## 2021-01-16 DIAGNOSIS — R079 Chest pain, unspecified: Secondary | ICD-10-CM | POA: Diagnosis not present

## 2021-01-17 DIAGNOSIS — R0600 Dyspnea, unspecified: Secondary | ICD-10-CM | POA: Diagnosis not present

## 2021-01-17 DIAGNOSIS — R0609 Other forms of dyspnea: Secondary | ICD-10-CM | POA: Diagnosis not present

## 2021-01-17 DIAGNOSIS — I2510 Atherosclerotic heart disease of native coronary artery without angina pectoris: Secondary | ICD-10-CM | POA: Diagnosis not present

## 2021-02-04 ENCOUNTER — Encounter (INDEPENDENT_AMBULATORY_CARE_PROVIDER_SITE_OTHER): Payer: Self-pay | Admitting: Internal Medicine

## 2021-02-04 ENCOUNTER — Ambulatory Visit (INDEPENDENT_AMBULATORY_CARE_PROVIDER_SITE_OTHER): Payer: Medicare Other | Admitting: Internal Medicine

## 2021-02-04 ENCOUNTER — Other Ambulatory Visit: Payer: Self-pay

## 2021-02-04 VITALS — BP 132/73 | HR 79 | Temp 97.7°F | Ht 76.0 in | Wt 292.1 lb

## 2021-02-04 DIAGNOSIS — K5000 Crohn's disease of small intestine without complications: Secondary | ICD-10-CM

## 2021-02-04 DIAGNOSIS — K5090 Crohn's disease, unspecified, without complications: Secondary | ICD-10-CM | POA: Insufficient documentation

## 2021-02-04 MED ORDER — PREDNISONE 10 MG PO TABS: ORAL_TABLET | ORAL | 0 refills | Status: DC

## 2021-02-04 NOTE — Patient Instructions (Signed)
Please keep daily symptom diary; abdominal pain stool frequency and consistency Physician will call with results of stool test when completed

## 2021-02-04 NOTE — Progress Notes (Signed)
Presenting complaint;  Follow-up for abdominal pain and ileitis.  Database and subjective:  Patient is 75 year old Caucasian male who has a history of chronic right lower quadrant abdominal pain.  About 3 years ago he was noted to have wall thickening on CT.  Colonoscopy revealed ileal ulcers.  Biopsy did not show pathology pneumonic changes of Crohn's disease.  NSAID was stopped.  However there was no improvement in his abdominal pain and/or ileal wall thickening on CT and ileitis on colonoscopy.  He also had MR enterography prior to his last visit and it showed segmental disease with wall thickening and luminal narrowing and distal small bowel. Based on his clinical course I felt that he had small bowel Crohn's disease.  He was begun on budesonide on his last prescription.  He took 9 mg daily for 1 month followed by 6 mg daily for 1 month and he is now on 3 mg daily.  He has maybe a week or 2 weeks worth of medication left.  He reports no improvement whatsoever. He continues to have abdominal pain when he is a bowel movement.  Pain is across lower abdomen more on the right side.  Last week he had 2 episodes of vomiting.  At times he may have nausea.  He generally has 3 bowel movements per day.  If her stool is formed and subsequent to stools are soft.  He has not had any accident.  He denies melena or rectal bleeding.  His appetite is good.  He has gained 3 pounds since his last visit.  He says he uses gel occasionally.  He has not taken NSAIDs in several months.  Current Medications: Outpatient Encounter Medications as of 02/04/2021  Medication Sig  . acetaminophen (TYLENOL) 500 MG tablet Take 1,000 mg by mouth every 8 (eight) hours as needed for mild pain or moderate pain.  Marland Kitchen aspirin EC 81 MG tablet Take 81 mg by mouth daily. Swallow whole.  . budesonide (ENTOCORT EC) 3 MG 24 hr capsule Take two capsules (6 mg) po Qd for 4 weeks, then decrease to one capsule (3 mg) po Qd for 4 weeks.  .  Calcium-Magnesium-Zinc (CAL-MAG-ZINC PO) Take 1 tablet by mouth at bedtime.  . carvedilol (COREG) 6.25 MG tablet Take 6.25 mg by mouth 2 (two) times daily with a meal.   . Cholecalciferol 5000 units TABS Take 5,000 Units by mouth daily.   . cyanocobalamin 1000 MCG tablet Take 1,000 mcg by mouth daily.  . diclofenac Sodium (VOLTAREN) 1 % GEL Apply 1 application topically daily as needed (pain).   Marland Kitchen diphenhydrAMINE-APAP, sleep, (TYLENOL PM EXTRA STRENGTH PO) Take by mouth. Patient reports takes 1 by mouth at night.  . DULoxetine (CYMBALTA) 30 MG capsule Take 30 mg by mouth daily.  Marland Kitchen Fish Oil-Cholecalciferol (FISH OIL + D3 PO) Take 1,400 mg by mouth daily.   Marland Kitchen gabapentin (NEURONTIN) 300 MG capsule Take 300 mg by mouth 2 (two) times daily.   . Melatonin 10 MG TABS Take 10 mg by mouth at bedtime.  . Multiple Vitamin (MULTIVITAMIN WITH MINERALS) TABS tablet Take 1 tablet by mouth daily.  Marland Kitchen olive oil external oil Apply topically as needed. Patient takes 1 by mouth daily. This is a capsule.  . pantoprazole (PROTONIX) 40 MG tablet Take 1 tablet (40 mg total) by mouth daily. (Patient taking differently: Take 40 mg by mouth 2 (two) times daily.)  . Probiotic Product (PROBIOTIC DAILY) CAPS Take 1 capsule by mouth daily.  . rosuvastatin (CRESTOR)  20 MG tablet Take 20 mg by mouth daily.  . tamsulosin (FLOMAX) 0.4 MG CAPS capsule Take 0.4 mg by mouth in the morning and at bedtime.   . Turmeric (QC TUMERIC COMPLEX PO) Take by mouth daily.  . methocarbamol (ROBAXIN) 500 MG tablet Take 1 tablet (500 mg total) by mouth every 6 (six) hours as needed for muscle spasms. (Patient not taking: Reported on 02/04/2021)   No facility-administered encounter medications on file as of 02/04/2021.     Objective: Blood pressure 132/73, pulse 79, temperature 97.7 F (36.5 C), temperature source Oral, height 6' 4"  (1.93 m), weight 292 lb 1.3 oz (132.5 kg). Patient is alert and in no acute distress. He is wearing a  mask. Conjunctiva is pink. Sclera is nonicteric Oropharyngeal mucosa is normal. No neck masses or thyromegaly noted. Cardiac exam with regular rhythm normal S1 and S2. No murmur or gallop noted. Lungs are clear to auscultation. Abdomen  No LE edema or clubbing noted.  Labs/studies Results:  CBC Latest Ref Rng & Units 09/12/2020 06/27/2020 06/18/2020  WBC 3.8 - 10.8 Thousand/uL 7.0 10.9(H) 7.5  Hemoglobin 13.2 - 17.1 g/dL 12.1(L) 10.6(L) 12.1(L)  Hematocrit 38.5 - 50.0 % 38.0(L) 33.2(L) 37.4(L)  Platelets 140 - 400 Thousand/uL 268 207 232    CMP Latest Ref Rng & Units 09/12/2020 06/27/2020 06/18/2020  Glucose 65 - 139 mg/dL 124 153(H) 88  BUN 7 - 25 mg/dL 19 18 21   Creatinine 0.70 - 1.18 mg/dL 1.56(H) 1.88(H) 1.44(H)  Sodium 135 - 146 mmol/L 139 132(L) 138  Potassium 3.5 - 5.3 mmol/L 4.9 4.8 4.4  Chloride 98 - 110 mmol/L 102 96(L) 103  CO2 20 - 32 mmol/L 28 24 28   Calcium 8.6 - 10.3 mg/dL 9.3 8.2(L) 8.8(L)  Total Protein 6.1 - 8.1 g/dL 6.0(L) - 6.1(L)  Total Bilirubin 0.2 - 1.2 mg/dL 0.3 - 0.3  Alkaline Phos 38 - 126 U/L - - 49  AST 10 - 35 U/L 18 - 20  ALT 9 - 46 U/L 13 - 17    Hepatic Function Latest Ref Rng & Units 09/12/2020 06/18/2020 12/21/2017  Total Protein 6.1 - 8.1 g/dL 6.0(L) 6.1(L) 5.8(L)  Albumin 3.5 - 5.0 g/dL - 3.8 -  AST 10 - 35 U/L 18 20 25   ALT 9 - 46 U/L 13 17 35  Alk Phosphatase 38 - 126 U/L - 49 -  Total Bilirubin 0.2 - 1.2 mg/dL 0.3 0.3 0.4    Lab Results  Component Value Date   CRP 2.4 09/12/2020      Assessment:  #1.  Patient has 3-year history of right lower quadrant abdominal pain as well as evidence of ileitis on colonoscopy of April 2019 and August 2021.  He used to be on NSAID therapy but not anymore.  He also had MR enterography in December 2021 revealing wall thickening and luminal narrowing to distal ileum with 3 discrete segments.  He was treated with budesonide but without symptomatic improvement.  Although biopsies did not reveal granulomas  I believe that we are dealing with small bowel Crohn's disease.  Since he has not responded but desonide retreated with prednisone for few weeks and hopefully he will respond. Will obtain baseline fecal calprotectin and also consider oral mesalamine.  His disease activity is deemed to be mild and therefore no indication for Biologics at this time.  Plan:  Fecal calprotectin. Discontinue budesonide. Begin prednisone as soon as stool specimen collected. Starting dose 30 mg by mouth every morning.  He  will drop the dose by 5 mg every week. He will call office if he has any side effects. Patient advised to keep daily symptom diary while he is on prednisone. Office visit in 3 months.

## 2021-03-05 DIAGNOSIS — Z13220 Encounter for screening for lipoid disorders: Secondary | ICD-10-CM | POA: Diagnosis not present

## 2021-03-05 DIAGNOSIS — R5383 Other fatigue: Secondary | ICD-10-CM | POA: Diagnosis not present

## 2021-03-05 DIAGNOSIS — N183 Chronic kidney disease, stage 3 unspecified: Secondary | ICD-10-CM | POA: Diagnosis not present

## 2021-03-05 DIAGNOSIS — E559 Vitamin D deficiency, unspecified: Secondary | ICD-10-CM | POA: Diagnosis not present

## 2021-03-05 DIAGNOSIS — D519 Vitamin B12 deficiency anemia, unspecified: Secondary | ICD-10-CM | POA: Diagnosis not present

## 2021-03-05 DIAGNOSIS — D649 Anemia, unspecified: Secondary | ICD-10-CM | POA: Diagnosis not present

## 2021-03-12 DIAGNOSIS — M170 Bilateral primary osteoarthritis of knee: Secondary | ICD-10-CM | POA: Diagnosis not present

## 2021-03-12 DIAGNOSIS — K7581 Nonalcoholic steatohepatitis (NASH): Secondary | ICD-10-CM | POA: Diagnosis not present

## 2021-03-12 DIAGNOSIS — Z1389 Encounter for screening for other disorder: Secondary | ICD-10-CM | POA: Diagnosis not present

## 2021-03-12 DIAGNOSIS — D649 Anemia, unspecified: Secondary | ICD-10-CM | POA: Diagnosis not present

## 2021-03-12 DIAGNOSIS — R7301 Impaired fasting glucose: Secondary | ICD-10-CM | POA: Diagnosis not present

## 2021-03-31 HISTORY — PX: SPINE SURGERY: SHX786

## 2021-04-01 DIAGNOSIS — Z79891 Long term (current) use of opiate analgesic: Secondary | ICD-10-CM | POA: Diagnosis not present

## 2021-05-13 ENCOUNTER — Other Ambulatory Visit (INDEPENDENT_AMBULATORY_CARE_PROVIDER_SITE_OTHER): Payer: Self-pay

## 2021-05-13 DIAGNOSIS — K633 Ulcer of intestine: Secondary | ICD-10-CM

## 2021-05-13 MED ORDER — PANTOPRAZOLE SODIUM 40 MG PO TBEC: 40.0000 mg | DELAYED_RELEASE_TABLET | Freq: Every day | ORAL | 3 refills | Status: DC

## 2021-06-03 ENCOUNTER — Ambulatory Visit (INDEPENDENT_AMBULATORY_CARE_PROVIDER_SITE_OTHER): Payer: Medicare Other | Admitting: Internal Medicine

## 2021-06-03 ENCOUNTER — Encounter (INDEPENDENT_AMBULATORY_CARE_PROVIDER_SITE_OTHER): Payer: Self-pay | Admitting: Internal Medicine

## 2021-06-03 ENCOUNTER — Other Ambulatory Visit: Payer: Self-pay

## 2021-06-03 VITALS — BP 121/70 | HR 87 | Temp 98.3°F | Ht 76.0 in | Wt 297.0 lb

## 2021-06-03 DIAGNOSIS — K5000 Crohn's disease of small intestine without complications: Secondary | ICD-10-CM | POA: Diagnosis not present

## 2021-06-03 MED ORDER — MESALAMINE ER 500 MG PO CPCR: 1000.0000 mg | ORAL_CAPSULE | Freq: Four times a day (QID) | ORAL | 11 refills | Status: DC

## 2021-06-03 NOTE — Patient Instructions (Signed)
Physician will call with results blood and stool test.  If Pentasa/mesalamine does not work would consider using Purinethol/6-mercaptopurine if enzyme level in your blood is within normal limits or at least 50% of normal.

## 2021-06-03 NOTE — Progress Notes (Signed)
Presenting complaint;  Follow for ileal Crohn's disease  Database and subjective:  Patient is 75 year old Caucasian male who is here for scheduled visit accompanied by his wife Bruce Stewart.  He was last seen on 02/04/2021. Patient was evaluated for right lower quadrant abdominal pain back in February 2019 when he also complained of having 4 bowel movements a day and stools were mushy to soft. Abdominal pelvic CT in March 2019 revealed sigmoid colon diverticulosis without diverticulitis and segmental wall thickening to distal ileum.  There were 3 separate segments with thickening. He underwent colonoscopy in April 2019 revealing 2 small ulcers involving terminal ileum.  Biopsy revealed ulceration without any features of Crohn's disease.  It was concluded that these ulcers were due to NSAID and he was advised to discontinue taking NSAIDs. He was seen again in July last year for heme positive stool and iron deficiency anemia. He underwent EGD and colonoscopy in August 2021. EGD revealed gastritis and fundal polyps.  Biopsy revealed fundic gland polyps and antral biopsy was negative for H. pylori gastritis.  Showed reactive changes. Colonoscopy revealed multiple ileal ulcers.  Biopsies revealed severely active chronic ileitis felt to be nonspecific as no granulomas were seen. I felt these changes were were concerning for ileal Crohn's disease.  Patient was not taking NSAIDs when he had a colonoscopy. He had another CT also in August 2021 which revealed several distinct areas of segmental wall thickening involving distal small bowel along with mild intramural fatty deposition within the affected bowel loops compatible with chronic inflammation. Patient return for follow-up visit in November 2021 and was seen by Dr. Jenetta Downer.  He had MR enterography in December 2021 revealing 3 discrete segments of mucosal thickening and luminal narrowing in the distal small bowel identical to changes seen on CT of January 19, 2018 and also August 2021. I saw patient on on 10/31/2020 and felt that he had ileal Crohn's disease and he was begun on budesonide.  He took 9 mg for 1 month followed by 6 mg daily for 1 month followed by 3 mg daily for 1 month.  He reported no change in right lower quadrant abdominal pain all stool frequency. On his last visit he was begun on prednisone at a dose of 30 mg daily for 1 week followed by 5 mg drop every week.  He finished prednisone towards end of May or first week of June this year.  Patient states he felt a lot better when he was on prednisone.  He stopped having pain and stool frequency decreased.  Now he is back to his baseline.  He also reports reduction in joint pain while he was on prednisone. He is having anywhere from 2-5 bowel movements per day.  Stools are soft.  He denies melena or rectal bleeding or nocturnal bowel movement.  He remains with pain in right lower quadrant abdomen which she describes as dull aching pain.  He has good appetite.  He has gained 5 pounds since his last visit.  He is not taking any NSAIDs.   Current Medications: Outpatient Encounter Medications as of 06/03/2021  Medication Sig   acetaminophen (TYLENOL) 500 MG tablet Take 1,000 mg by mouth every 8 (eight) hours as needed for mild pain or moderate pain.   aspirin EC 81 MG tablet Take 81 mg by mouth daily. Swallow whole.   buprenorphine (BUTRANS) 5 MCG/HR PTWK Place 1 patch onto the skin once a week.   Calcium Carbonate Antacid (TUMS PO) Take by mouth. Takes  prn   Calcium-Magnesium-Zinc (CAL-MAG-ZINC PO) Take 1 tablet by mouth at bedtime.   carvedilol (COREG) 6.25 MG tablet Take 6.25 mg by mouth 2 (two) times daily with a meal.    Cholecalciferol 5000 units TABS Take 5,000 Units by mouth daily.    cyanocobalamin 1000 MCG tablet Take 1,000 mcg by mouth daily.   diclofenac Sodium (VOLTAREN) 1 % GEL Apply 1 application topically daily as needed (pain).    diphenhydrAMINE-APAP, sleep, (TYLENOL PM EXTRA  STRENGTH PO) Take by mouth. Patient reports takes 1 by mouth at night.   DULoxetine (CYMBALTA) 30 MG capsule Take 30 mg by mouth daily.   Ferrous Sulfate (IRON) 325 (65 Fe) MG TABS Take by mouth. One daily   Fish Oil-Cholecalciferol (FISH OIL + D3 PO) Take 1,400 mg by mouth daily.    gabapentin (NEURONTIN) 300 MG capsule Take 300 mg by mouth 2 (two) times daily.    Melatonin 10 MG TABS Take 10 mg by mouth at bedtime.   Multiple Vitamin (MULTIVITAMIN WITH MINERALS) TABS tablet Take 1 tablet by mouth daily.   olive oil external oil Apply topically as needed. Patient takes 1 by mouth daily. This is a capsule.   Probiotic Product (PROBIOTIC DAILY) CAPS Take 1 capsule by mouth daily.   rosuvastatin (CRESTOR) 20 MG tablet Take 20 mg by mouth daily.   tamsulosin (FLOMAX) 0.4 MG CAPS capsule Take 0.4 mg by mouth in the morning and at bedtime.    Turmeric (QC TUMERIC COMPLEX PO) Take by mouth daily.   [DISCONTINUED] pantoprazole (PROTONIX) 40 MG tablet Take 1 tablet (40 mg total) by mouth daily.   [DISCONTINUED] methocarbamol (ROBAXIN) 500 MG tablet Take 1 tablet (500 mg total) by mouth every 6 (six) hours as needed for muscle spasms. (Patient not taking: Reported on 02/04/2021)   [DISCONTINUED] predniSONE (DELTASONE) 10 MG tablet 30 mg every morning for one week 25 mg every morning for one week 20 mg every morning for one week 15 mg every morning for one week 10 mg every morning for one week  5 mg every morning for one week and stop   No facility-administered encounter medications on file as of 06/03/2021.     Objective: Blood pressure 121/70, pulse 87, temperature 98.3 F (36.8 C), temperature source Oral, height 6' 4"  (1.93 m), weight 297 lb (134.7 kg). Patient is alert and in no acute distress. He is wearing a mask. Conjunctiva is pink. Sclera is nonicteric Oropharyngeal mucosa is normal. No neck masses or thyromegaly noted. Cardiac exam with regular rhythm normal S1 and S2. No murmur or  gallop noted. Lungs are clear to auscultation. Abdomen is obese.  Bowel sounds are normal.  On palpation abdomen is soft.  He has mild tenderness and fullness in right lower quadrant.  No guarding.  No organomegaly or masses. No LE edema or clubbing noted.  Labs/studies Results:   CBC Latest Ref Rng & Units 09/12/2020 06/27/2020 06/18/2020  WBC 3.8 - 10.8 Thousand/uL 7.0 10.9(H) 7.5  Hemoglobin 13.2 - 17.1 g/dL 12.1(L) 10.6(L) 12.1(L)  Hematocrit 38.5 - 50.0 % 38.0(L) 33.2(L) 37.4(L)  Platelets 140 - 400 Thousand/uL 268 207 232    CMP Latest Ref Rng & Units 09/12/2020 06/27/2020 06/18/2020  Glucose 65 - 139 mg/dL 124 153(H) 88  BUN 7 - 25 mg/dL 19 18 21   Creatinine 0.70 - 1.18 mg/dL 1.56(H) 1.88(H) 1.44(H)  Sodium 135 - 146 mmol/L 139 132(L) 138  Potassium 3.5 - 5.3 mmol/L 4.9 4.8 4.4  Chloride 98 -  110 mmol/L 102 96(L) 103  CO2 20 - 32 mmol/L 28 24 28   Calcium 8.6 - 10.3 mg/dL 9.3 8.2(L) 8.8(L)  Total Protein 6.1 - 8.1 g/dL 6.0(L) - 6.1(L)  Total Bilirubin 0.2 - 1.2 mg/dL 0.3 - 0.3  Alkaline Phos 38 - 126 U/L - - 49  AST 10 - 35 U/L 18 - 20  ALT 9 - 46 U/L 13 - 17    Hepatic Function Latest Ref Rng & Units 09/12/2020 06/18/2020 12/21/2017  Total Protein 6.1 - 8.1 g/dL 6.0(L) 6.1(L) 5.8(L)  Albumin 3.5 - 5.0 g/dL - 3.8 -  AST 10 - 35 U/L 18 20 25   ALT 9 - 46 U/L 13 17 35  Alk Phosphatase 38 - 126 U/L - 49 -  Total Bilirubin 0.2 - 1.2 mg/dL 0.3 0.3 0.4      Assessment:  #1.  Ileal Crohn's disease.  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  However his clinical course and follow-up colonoscopy last year leaves no doubt as to the diagnosis.  He had complete symptomatic improvement with prednisone.  He did not respond to budesonide.  His symptoms are mild.  Therefore biologic therapy would not be recommended.  Choices would be oral mesalamine which is bioavailable and distal small bowel or 6-MP.  #2.   History of iron deficiency anemia.  Hemoglobin 6 months ago was 12.1 g.  Plan:  Patient will go to the lab for further following TPMT assay. Fecal calprotectin. Begin Pentasa 1 g by mouth 4 times a day.  Prescription sent to patient's pharmacy in [place] [facility]). Follow-up in 3 months.

## 2021-06-09 DIAGNOSIS — K5000 Crohn's disease of small intestine without complications: Secondary | ICD-10-CM | POA: Diagnosis not present

## 2021-06-11 ENCOUNTER — Telehealth (INDEPENDENT_AMBULATORY_CARE_PROVIDER_SITE_OTHER): Payer: Self-pay | Admitting: *Deleted

## 2021-06-11 NOTE — Telephone Encounter (Signed)
  Pt was prescribed mesalamine '500mg'$  take 2 qid by dr Laural Golden. Pt gets meds from [place] and they did not have that dose so they chagned to '1200mg'$  3 daily. Pt wanted to clarify if he could take that way before starting med and also is having a spine stimulator next week on the 19th and was told to stop all meds one week prior (ont he 12th) except his pain patch, aspirin and heart med. Pt wants to know should he wait til after stimulator to start this med.

## 2021-06-12 DIAGNOSIS — M25561 Pain in right knee: Secondary | ICD-10-CM | POA: Diagnosis not present

## 2021-06-12 DIAGNOSIS — M25562 Pain in left knee: Secondary | ICD-10-CM | POA: Diagnosis not present

## 2021-06-12 DIAGNOSIS — M170 Bilateral primary osteoarthritis of knee: Secondary | ICD-10-CM | POA: Diagnosis not present

## 2021-06-12 NOTE — Telephone Encounter (Signed)
I called and talked with patient. He was given mesalamine preparation which may not be bioavailable and distal small bowel that he can use it in see if it helps. He may not see benefit for 7 weeks.  If his pain and diarrhea improves then we know that the medication is working otherwise we will look at other options.

## 2021-06-17 LAB — THIOPURINE METHYLTRANSFERASE (TPMT), RBC: Thiopurine Methyltransferase, RBC: 15 nmol/hr/mL RBC

## 2021-07-09 DIAGNOSIS — K2100 Gastro-esophageal reflux disease with esophagitis, without bleeding: Secondary | ICD-10-CM | POA: Diagnosis not present

## 2021-07-09 DIAGNOSIS — N183 Chronic kidney disease, stage 3 unspecified: Secondary | ICD-10-CM | POA: Diagnosis not present

## 2021-07-09 DIAGNOSIS — R5383 Other fatigue: Secondary | ICD-10-CM | POA: Diagnosis not present

## 2021-07-09 DIAGNOSIS — M170 Bilateral primary osteoarthritis of knee: Secondary | ICD-10-CM | POA: Diagnosis not present

## 2021-07-09 DIAGNOSIS — M25561 Pain in right knee: Secondary | ICD-10-CM | POA: Diagnosis not present

## 2021-07-09 DIAGNOSIS — E7800 Pure hypercholesterolemia, unspecified: Secondary | ICD-10-CM | POA: Diagnosis not present

## 2021-07-09 DIAGNOSIS — M25562 Pain in left knee: Secondary | ICD-10-CM | POA: Diagnosis not present

## 2021-07-16 DIAGNOSIS — M170 Bilateral primary osteoarthritis of knee: Secondary | ICD-10-CM | POA: Diagnosis not present

## 2021-07-16 DIAGNOSIS — M25562 Pain in left knee: Secondary | ICD-10-CM | POA: Diagnosis not present

## 2021-07-16 DIAGNOSIS — M25561 Pain in right knee: Secondary | ICD-10-CM | POA: Diagnosis not present

## 2021-07-21 DIAGNOSIS — N183 Chronic kidney disease, stage 3 unspecified: Secondary | ICD-10-CM | POA: Diagnosis not present

## 2021-07-21 DIAGNOSIS — Z0001 Encounter for general adult medical examination with abnormal findings: Secondary | ICD-10-CM | POA: Diagnosis not present

## 2021-07-21 DIAGNOSIS — R7301 Impaired fasting glucose: Secondary | ICD-10-CM | POA: Diagnosis not present

## 2021-07-21 DIAGNOSIS — Z23 Encounter for immunization: Secondary | ICD-10-CM | POA: Diagnosis not present

## 2021-07-21 DIAGNOSIS — K5090 Crohn's disease, unspecified, without complications: Secondary | ICD-10-CM | POA: Diagnosis not present

## 2021-07-21 DIAGNOSIS — R197 Diarrhea, unspecified: Secondary | ICD-10-CM | POA: Diagnosis not present

## 2021-07-21 DIAGNOSIS — N138 Other obstructive and reflux uropathy: Secondary | ICD-10-CM | POA: Diagnosis not present

## 2021-07-24 DIAGNOSIS — M25561 Pain in right knee: Secondary | ICD-10-CM | POA: Diagnosis not present

## 2021-07-24 DIAGNOSIS — M170 Bilateral primary osteoarthritis of knee: Secondary | ICD-10-CM | POA: Diagnosis not present

## 2021-07-24 DIAGNOSIS — M25562 Pain in left knee: Secondary | ICD-10-CM | POA: Diagnosis not present

## 2021-07-29 DIAGNOSIS — I2510 Atherosclerotic heart disease of native coronary artery without angina pectoris: Secondary | ICD-10-CM | POA: Diagnosis not present

## 2021-07-29 DIAGNOSIS — E785 Hyperlipidemia, unspecified: Secondary | ICD-10-CM | POA: Diagnosis not present

## 2021-07-29 DIAGNOSIS — I10 Essential (primary) hypertension: Secondary | ICD-10-CM | POA: Diagnosis not present

## 2021-09-02 ENCOUNTER — Other Ambulatory Visit: Payer: Self-pay

## 2021-09-02 ENCOUNTER — Ambulatory Visit (INDEPENDENT_AMBULATORY_CARE_PROVIDER_SITE_OTHER): Payer: No Typology Code available for payment source | Admitting: Internal Medicine

## 2021-09-02 ENCOUNTER — Encounter (INDEPENDENT_AMBULATORY_CARE_PROVIDER_SITE_OTHER): Payer: Self-pay | Admitting: Internal Medicine

## 2021-09-02 VITALS — BP 133/76 | HR 81 | Temp 98.1°F | Ht 76.0 in | Wt 295.9 lb

## 2021-09-02 DIAGNOSIS — K5000 Crohn's disease of small intestine without complications: Secondary | ICD-10-CM | POA: Diagnosis not present

## 2021-09-02 DIAGNOSIS — D649 Anemia, unspecified: Secondary | ICD-10-CM | POA: Diagnosis not present

## 2021-09-02 NOTE — Patient Instructions (Signed)
Physician will call with results of blood  work when completed. Remember to take Pentasa/mesalamine 1 g three times a day

## 2021-09-02 NOTE — Progress Notes (Signed)
Presenting complaint;  Follow-up for small bowel Crohn's disease.  Database and subjective:  Patient is 75 year old Caucasian male who is here for scheduled visit accompanied by his wife Hassan Rowan. Patient presented over 3-1/2 years ago with right lower quadrant abdominal pain diarrhea and abnormal CT revealing thickening to distal small bowel. Colonoscopy in April 2019 revealed ulceration of terminal ileum and biopsy revealed nonspecific changes.  He also had sigmoid colon diverticulosis.  These changes were felt to be due to NSAID therapy but his ileitis has never resolved off NSAID therapy. Repeat colonoscopy in August last year revealed multiple ulcers involving terminal ileum sigmoid diverticulosis and external hemorrhoids.  Ileal biopsy reveals severely active chronic nonspecific ileitis with ulceration.  No granulomas or dysplasia noted.   Follow-up CT on 06/19/2020 revealed several distinct areas of segmental wall thickening involving distal ileum.  There was mild intramural fatty deposition compatible with chronic inflammation.  These changes were concerning for Crohn's disease. He did not take NSAIDs for more than 5 months and had MR enterography in December 2021 revealed 3 discrete segments of mucosal thickening and luminal narrowing at distal terminal ileum.  Sigmoid diverticulosis without diverticulitis. Patient was treated with budesonide and reported no improvement.  Therefore in April 2022 he was begun on prednisone and on a follow-up visit in August 2022 he reported significant improvement.  I therefore recommended Pentasa.  We also considered immunomodulator.  TPMT assay was normal.  He has been on Pentasa but he is only taking it twice a day.  He has noted decrease in intensity of pain in right lower quadrant.  He is not having any diarrhea.  On most days he has 1-3 bowel movements.  For stool is always formed and subsequent stools are soft.  He denies melena or rectal bleeding. He  takes Tylenol every day but no more than 2 g/day for joint pain.  He states he takes Tums no more than 3-4 times in a month. He also complains of pain and stiffness in small joints of his hands.  He also has noted numbness.  He has office visit with his joint specialist.  His wife is concerned that he may have carpal tunnel syndrome. His appetite remains good.  He has not lost any weight.   Current Medications: Outpatient Encounter Medications as of 09/02/2021  Medication Sig   acetaminophen (TYLENOL) 500 MG tablet Take 1,000 mg by mouth every 8 (eight) hours as needed for mild pain or moderate pain.   aspirin EC 81 MG tablet Take 81 mg by mouth daily. Swallow whole.   buprenorphine (BUTRANS) 5 MCG/HR PTWK Place 1 patch onto the skin once a week.   Calcium Carbonate Antacid (TUMS PO) Take by mouth. Takes prn   Calcium-Magnesium-Zinc (CAL-MAG-ZINC PO) Take 1 tablet by mouth at bedtime.   carvedilol (COREG) 6.25 MG tablet Take 6.25 mg by mouth 2 (two) times daily with a meal.    Cholecalciferol 5000 units TABS Take 5,000 Units by mouth daily.    cyanocobalamin 1000 MCG tablet Take 1,000 mcg by mouth daily.   diclofenac Sodium (VOLTAREN) 1 % GEL Apply 1 application topically daily as needed (pain).    DULoxetine (CYMBALTA) 30 MG capsule Take 30 mg by mouth daily.   Ferrous Sulfate (IRON) 325 (65 Fe) MG TABS Take by mouth. One daily   Fish Oil-Cholecalciferol (FISH OIL + D3 PO) Take 1,400 mg by mouth daily.    gabapentin (NEURONTIN) 300 MG capsule Take 300 mg by mouth at bedtime.  Melatonin 10 MG TABS Take 10 mg by mouth at bedtime.   mesalamine (PENTASA) 500 MG CR capsule Take 2 capsules (1,000 mg total) by mouth 4 (four) times daily. (Patient taking differently: Take 1,000 mg by mouth 3 (three) times daily.)   Multiple Vitamin (MULTIVITAMIN WITH MINERALS) TABS tablet Take 1 tablet by mouth daily.   olive oil external oil Apply topically as needed. Patient takes 1 by mouth daily. This is a  capsule.   rosuvastatin (CRESTOR) 20 MG tablet Take 20 mg by mouth daily.   tamsulosin (FLOMAX) 0.4 MG CAPS capsule Take 0.4 mg by mouth in the morning and at bedtime.    Turmeric (QC TUMERIC COMPLEX PO) Take by mouth daily.   [DISCONTINUED] diphenhydrAMINE-APAP, sleep, (TYLENOL PM EXTRA STRENGTH PO) Take by mouth. Patient reports takes 1 by mouth at night. (Patient not taking: Reported on 09/02/2021)   No facility-administered encounter medications on file as of 09/02/2021.     Objective: Blood pressure 133/76, pulse 81, temperature 98.1 F (36.7 C), temperature source Oral, height 6' 4"  (1.93 m), weight 295 lb 14.4 oz (134.2 kg). Patient is alert and in no acute distress. Conjunctiva is pink. Sclera is nonicteric Oropharyngeal mucosa is normal. No neck masses or thyromegaly noted. Cardiac exam with regular rhythm normal S1 and S2. No murmur or gallop noted. Lungs are clear to auscultation. Abdomen is full.  Bowel sounds are normal.  On palpation abdomen is soft and nontender with organomegaly or masses. No LE edema or clubbing noted.  Labs/studies Results:   CBC Latest Ref Rng & Units 09/12/2020 06/27/2020 06/18/2020  WBC 3.8 - 10.8 Thousand/uL 7.0 10.9(H) 7.5  Hemoglobin 13.2 - 17.1 g/dL 12.1(L) 10.6(L) 12.1(L)  Hematocrit 38.5 - 50.0 % 38.0(L) 33.2(L) 37.4(L)  Platelets 140 - 400 Thousand/uL 268 207 232    CMP Latest Ref Rng & Units 09/12/2020 06/27/2020 06/18/2020  Glucose 65 - 139 mg/dL 124 153(H) 88  BUN 7 - 25 mg/dL 19 18 21   Creatinine 0.70 - 1.18 mg/dL 1.56(H) 1.88(H) 1.44(H)  Sodium 135 - 146 mmol/L 139 132(L) 138  Potassium 3.5 - 5.3 mmol/L 4.9 4.8 4.4  Chloride 98 - 110 mmol/L 102 96(L) 103  CO2 20 - 32 mmol/L 28 24 28   Calcium 8.6 - 10.3 mg/dL 9.3 8.2(L) 8.8(L)  Total Protein 6.1 - 8.1 g/dL 6.0(L) - 6.1(L)  Total Bilirubin 0.2 - 1.2 mg/dL 0.3 - 0.3  Alkaline Phos 38 - 126 U/L - - 49  AST 10 - 35 U/L 18 - 20  ALT 9 - 46 U/L 13 - 17    Hepatic Function Latest Ref  Rng & Units 09/12/2020 06/18/2020 12/21/2017  Total Protein 6.1 - 8.1 g/dL 6.0(L) 6.1(L) 5.8(L)  Albumin 3.5 - 5.0 g/dL - 3.8 -  AST 10 - 35 U/L 18 20 25   ALT 9 - 46 U/L 13 17 35  Alk Phosphatase 38 - 126 U/L - 49 -  Total Bilirubin 0.2 - 1.2 mg/dL 0.3 0.3 0.4    Lab Results  Component Value Date   CRP 2.4 09/12/2020    No recent CBC on file.  Assessment:  #1.  Terminal ileitis since April 2019.  Initially it was felt to be due to NSAID but ileitis has persisted along with RLQ pain despite stopping NSAID.  Imaging suggests Crohn's disease.  He experienced excellent symptomatic response with short course of prednisone but not with budesonide prior to that.  He is on Pentasa/mesalamine with symptomatic improvement.  #2.  Anemia.  Plan:  Patient advised to take Pentasa at least 1 g 3 times a day if not 4 times daily. He will go to the lab for CBC with differential and comprehensive chemistry panel. Office visit in 6 months.

## 2021-09-03 LAB — COMPREHENSIVE METABOLIC PANEL
AG Ratio: 2.6 (calc) — ABNORMAL HIGH (ref 1.0–2.5)
ALT: 11 U/L (ref 9–46)
AST: 15 U/L (ref 10–35)
Albumin: 4.1 g/dL (ref 3.6–5.1)
Alkaline phosphatase (APISO): 55 U/L (ref 35–144)
BUN/Creatinine Ratio: 14 (calc) (ref 6–22)
BUN: 20 mg/dL (ref 7–25)
CO2: 29 mmol/L (ref 20–32)
Calcium: 9 mg/dL (ref 8.6–10.3)
Chloride: 101 mmol/L (ref 98–110)
Creat: 1.43 mg/dL — ABNORMAL HIGH (ref 0.70–1.28)
Globulin: 1.6 g/dL (calc) — ABNORMAL LOW (ref 1.9–3.7)
Glucose, Bld: 121 mg/dL (ref 65–139)
Potassium: 5 mmol/L (ref 3.5–5.3)
Sodium: 140 mmol/L (ref 135–146)
Total Bilirubin: 0.3 mg/dL (ref 0.2–1.2)
Total Protein: 5.7 g/dL — ABNORMAL LOW (ref 6.1–8.1)

## 2021-09-03 LAB — CBC WITH DIFFERENTIAL/PLATELET
Absolute Monocytes: 459 cells/uL (ref 200–950)
Basophils Absolute: 39 cells/uL (ref 0–200)
Basophils Relative: 0.7 %
Eosinophils Absolute: 140 cells/uL (ref 15–500)
Eosinophils Relative: 2.5 %
HCT: 38.7 % (ref 38.5–50.0)
Hemoglobin: 12.3 g/dL — ABNORMAL LOW (ref 13.2–17.1)
Lymphs Abs: 1781 cells/uL (ref 850–3900)
MCH: 29.1 pg (ref 27.0–33.0)
MCHC: 31.8 g/dL — ABNORMAL LOW (ref 32.0–36.0)
MCV: 91.7 fL (ref 80.0–100.0)
MPV: 11.1 fL (ref 7.5–12.5)
Monocytes Relative: 8.2 %
Neutro Abs: 3181 cells/uL (ref 1500–7800)
Neutrophils Relative %: 56.8 %
Platelets: 216 10*3/uL (ref 140–400)
RBC: 4.22 10*6/uL (ref 4.20–5.80)
RDW: 12.6 % (ref 11.0–15.0)
Total Lymphocyte: 31.8 %
WBC: 5.6 10*3/uL (ref 3.8–10.8)

## 2021-09-04 ENCOUNTER — Ambulatory Visit (INDEPENDENT_AMBULATORY_CARE_PROVIDER_SITE_OTHER): Payer: Medicare Other | Admitting: Internal Medicine

## 2021-09-04 ENCOUNTER — Other Ambulatory Visit (INDEPENDENT_AMBULATORY_CARE_PROVIDER_SITE_OTHER): Payer: Self-pay | Admitting: Internal Medicine

## 2021-09-04 DIAGNOSIS — D801 Nonfamilial hypogammaglobulinemia: Secondary | ICD-10-CM

## 2021-10-22 DIAGNOSIS — M542 Cervicalgia: Secondary | ICD-10-CM | POA: Diagnosis not present

## 2021-10-22 DIAGNOSIS — M4722 Other spondylosis with radiculopathy, cervical region: Secondary | ICD-10-CM | POA: Diagnosis not present

## 2021-10-29 DIAGNOSIS — R200 Anesthesia of skin: Secondary | ICD-10-CM | POA: Diagnosis not present

## 2021-10-29 DIAGNOSIS — M4802 Spinal stenosis, cervical region: Secondary | ICD-10-CM | POA: Diagnosis not present

## 2021-10-29 DIAGNOSIS — M2578 Osteophyte, vertebrae: Secondary | ICD-10-CM | POA: Diagnosis not present

## 2021-10-29 DIAGNOSIS — M542 Cervicalgia: Secondary | ICD-10-CM | POA: Diagnosis not present

## 2021-10-29 DIAGNOSIS — M47812 Spondylosis without myelopathy or radiculopathy, cervical region: Secondary | ICD-10-CM | POA: Diagnosis not present

## 2021-11-06 DIAGNOSIS — M4722 Other spondylosis with radiculopathy, cervical region: Secondary | ICD-10-CM | POA: Diagnosis not present

## 2021-11-06 DIAGNOSIS — M542 Cervicalgia: Secondary | ICD-10-CM | POA: Diagnosis not present

## 2021-11-19 DIAGNOSIS — N183 Chronic kidney disease, stage 3 unspecified: Secondary | ICD-10-CM | POA: Diagnosis not present

## 2021-11-19 DIAGNOSIS — Z13220 Encounter for screening for lipoid disorders: Secondary | ICD-10-CM | POA: Diagnosis not present

## 2021-11-19 DIAGNOSIS — R5383 Other fatigue: Secondary | ICD-10-CM | POA: Diagnosis not present

## 2021-11-19 DIAGNOSIS — E7800 Pure hypercholesterolemia, unspecified: Secondary | ICD-10-CM | POA: Diagnosis not present

## 2021-11-27 DIAGNOSIS — N138 Other obstructive and reflux uropathy: Secondary | ICD-10-CM | POA: Diagnosis not present

## 2021-11-27 DIAGNOSIS — K5090 Crohn's disease, unspecified, without complications: Secondary | ICD-10-CM | POA: Diagnosis not present

## 2021-11-27 DIAGNOSIS — M545 Low back pain, unspecified: Secondary | ICD-10-CM | POA: Diagnosis not present

## 2021-11-27 DIAGNOSIS — R7301 Impaired fasting glucose: Secondary | ICD-10-CM | POA: Diagnosis not present

## 2021-11-27 DIAGNOSIS — R197 Diarrhea, unspecified: Secondary | ICD-10-CM | POA: Diagnosis not present

## 2022-01-27 DIAGNOSIS — I350 Nonrheumatic aortic (valve) stenosis: Secondary | ICD-10-CM | POA: Diagnosis not present

## 2022-01-27 DIAGNOSIS — I10 Essential (primary) hypertension: Secondary | ICD-10-CM | POA: Diagnosis not present

## 2022-01-27 DIAGNOSIS — E785 Hyperlipidemia, unspecified: Secondary | ICD-10-CM | POA: Diagnosis not present

## 2022-01-27 DIAGNOSIS — I2510 Atherosclerotic heart disease of native coronary artery without angina pectoris: Secondary | ICD-10-CM | POA: Diagnosis not present

## 2022-03-03 ENCOUNTER — Ambulatory Visit (INDEPENDENT_AMBULATORY_CARE_PROVIDER_SITE_OTHER): Payer: Non-veteran care | Admitting: Internal Medicine

## 2022-03-13 ENCOUNTER — Other Ambulatory Visit (INDEPENDENT_AMBULATORY_CARE_PROVIDER_SITE_OTHER): Payer: Self-pay | Admitting: Internal Medicine

## 2022-03-13 DIAGNOSIS — K633 Ulcer of intestine: Secondary | ICD-10-CM

## 2022-03-16 NOTE — Telephone Encounter (Signed)
Spoke with the patient he state he still takes this and will need a refill.  ?

## 2022-03-16 NOTE — Telephone Encounter (Signed)
Last seen 09/02/2021, has follow up appointment on 04/07/2022. ?

## 2022-04-07 ENCOUNTER — Ambulatory Visit (INDEPENDENT_AMBULATORY_CARE_PROVIDER_SITE_OTHER): Payer: Non-veteran care | Admitting: Internal Medicine

## 2022-05-18 DIAGNOSIS — R7301 Impaired fasting glucose: Secondary | ICD-10-CM | POA: Diagnosis not present

## 2022-05-18 DIAGNOSIS — E782 Mixed hyperlipidemia: Secondary | ICD-10-CM | POA: Diagnosis not present

## 2022-05-18 DIAGNOSIS — D649 Anemia, unspecified: Secondary | ICD-10-CM | POA: Diagnosis not present

## 2022-05-18 DIAGNOSIS — N189 Chronic kidney disease, unspecified: Secondary | ICD-10-CM | POA: Diagnosis not present

## 2022-05-18 DIAGNOSIS — K2100 Gastro-esophageal reflux disease with esophagitis, without bleeding: Secondary | ICD-10-CM | POA: Diagnosis not present

## 2022-05-19 DIAGNOSIS — R5383 Other fatigue: Secondary | ICD-10-CM | POA: Diagnosis not present

## 2022-05-19 DIAGNOSIS — Z13220 Encounter for screening for lipoid disorders: Secondary | ICD-10-CM | POA: Diagnosis not present

## 2022-05-19 DIAGNOSIS — E7800 Pure hypercholesterolemia, unspecified: Secondary | ICD-10-CM | POA: Diagnosis not present

## 2022-05-25 DIAGNOSIS — Z23 Encounter for immunization: Secondary | ICD-10-CM | POA: Diagnosis not present

## 2022-05-25 DIAGNOSIS — K5090 Crohn's disease, unspecified, without complications: Secondary | ICD-10-CM | POA: Diagnosis not present

## 2022-05-25 DIAGNOSIS — N183 Chronic kidney disease, stage 3 unspecified: Secondary | ICD-10-CM | POA: Diagnosis not present

## 2022-05-25 DIAGNOSIS — R7301 Impaired fasting glucose: Secondary | ICD-10-CM | POA: Diagnosis not present

## 2022-05-25 DIAGNOSIS — N138 Other obstructive and reflux uropathy: Secondary | ICD-10-CM | POA: Diagnosis not present

## 2022-05-25 DIAGNOSIS — M545 Low back pain, unspecified: Secondary | ICD-10-CM | POA: Diagnosis not present

## 2022-05-25 DIAGNOSIS — R197 Diarrhea, unspecified: Secondary | ICD-10-CM | POA: Diagnosis not present

## 2022-06-01 ENCOUNTER — Encounter (INDEPENDENT_AMBULATORY_CARE_PROVIDER_SITE_OTHER): Payer: Self-pay

## 2022-06-01 ENCOUNTER — Encounter (INDEPENDENT_AMBULATORY_CARE_PROVIDER_SITE_OTHER): Payer: Self-pay | Admitting: Gastroenterology

## 2022-06-01 ENCOUNTER — Ambulatory Visit (INDEPENDENT_AMBULATORY_CARE_PROVIDER_SITE_OTHER): Payer: Self-pay | Admitting: Gastroenterology

## 2022-06-01 DIAGNOSIS — Z538 Procedure and treatment not carried out for other reasons: Secondary | ICD-10-CM

## 2022-06-01 NOTE — Progress Notes (Unsigned)
Patient left the office before being seen. Encounter was avoided.

## 2022-06-02 DIAGNOSIS — Z538 Procedure and treatment not carried out for other reasons: Secondary | ICD-10-CM | POA: Insufficient documentation

## 2022-06-10 ENCOUNTER — Ambulatory Visit (INDEPENDENT_AMBULATORY_CARE_PROVIDER_SITE_OTHER): Payer: Medicare Other | Admitting: Gastroenterology

## 2022-06-10 ENCOUNTER — Encounter (INDEPENDENT_AMBULATORY_CARE_PROVIDER_SITE_OTHER): Payer: Self-pay | Admitting: Gastroenterology

## 2022-06-10 ENCOUNTER — Other Ambulatory Visit (INDEPENDENT_AMBULATORY_CARE_PROVIDER_SITE_OTHER): Payer: Self-pay

## 2022-06-10 ENCOUNTER — Encounter (INDEPENDENT_AMBULATORY_CARE_PROVIDER_SITE_OTHER): Payer: Self-pay

## 2022-06-10 VITALS — BP 170/84 | HR 79 | Temp 98.7°F | Ht 76.0 in | Wt 290.0 lb

## 2022-06-10 DIAGNOSIS — K5000 Crohn's disease of small intestine without complications: Secondary | ICD-10-CM | POA: Diagnosis not present

## 2022-06-10 NOTE — Patient Instructions (Signed)
Schedule colonoscopy Check with your insurance which medication will be covered for Small bowel Crohn's disease - Clarita Leber, Skyrizi, Remicade, Humira or Cimzia. Continue mesalamine 500 mg TID

## 2022-06-10 NOTE — Progress Notes (Signed)
Maylon Peppers, M.D. Gastroenterology & Hepatology [facility] [address] [place], [place] [address]  Primary Care Physician: Manon Hilding, MD [place] [address]  I will communicate my assessment and recommendations to the referring MD via EMR.  Problems: moderate Crohn's ileitis  History of Present Illness: Bruce Stewart is a 76 y.o. male with PMH moderate Crohn's ileitis, coronary artery disease, history of colon cancer, urolithiasis, who presents for follow up of Crohn's disease.  The patient was last seen on 09/02/2021. At that time, the patient was advised to continue Pentasa 1 g 3 times a day.  Labs on 09/02/2021 showed hemoglobin of 12.3, MCV of 91, platelets 216, although cell count of 5.6, CMP with a creatinine of 1.43, total bili 0.3, alkaline phosphatase 55, AST 15, ALT 11, normal electrolytes.  Patient reports that in the last 6 months he has presented only 2 episodes of diarrhea. States the first one was a week ago which he described as soft BMs. Today he had some watery bowel movements, so he took some Imodium which decreased his bowel movements effectively. He only presented some pain in his LLQ.  Endorsed having some episodes of pain to lower abdomen in the right lower quadrant in the past.  He reports that he has presented chronic flatulence and bloating despite taking his medication compliantly.  He is currently taking Pentasa 500 mg TID.  Has not taken any BC or Goody powders, no NSAIDs.  The patient denies having any nausea, vomiting, fever, chills, hematochezia, melena, hematemesis, jaundice, pruritus or weight loss.  Last EGD: 06/12/2020, normal esophagus, erythema granularity in the antrum (moderate gastropathy with negative H. pylori staining), with presence of 2 polyps in the gastric fundus (biopsies showed fundic gland polyps), normal duodenal bulb. Last Colonoscopy: 06/12/2020, multiple  ulcers located intermittently (biopsies showed changes consistent with severe ileitis with architectural distortion), diverticulosis, external hemorrhoids.  Past Medical History: Past Medical History:  Diagnosis Date   Anal fissure    Anemia    Arthritis    Complication of anesthesia    Coronary artery disease    Diverticulitis    Family hx of colon cancer    Kidney stone    Myocardial infarction ([place]) 10/2019   PONV (postoperative nausea and vomiting)     Past Surgical History: Past Surgical History:  Procedure Laterality Date   back surgery x 6 Other]     BIOPSY  02/11/2018   Procedure: BIOPSY;  Surgeon: Rogene Houston, MD;  [facility];  Service: Endoscopy;;  ileal   BIOPSY  06/12/2020   Procedure: BIOPSY;  Surgeon: Rogene Houston, MD;  [facility];  Service: Endoscopy;;  antral, ileal   CARDIAC CATHETERIZATION  10/2019   CIRCUMCISION     COLONOSCOPY N/A 09/12/2015   Procedure: COLONOSCOPY;  Surgeon: Rogene Houston, MD;  [facility];  Service: Endoscopy;  Laterality: N/A;  2:25-moved up to 77 Ann notified pt   COLONOSCOPY N/A 02/11/2018   Procedure: COLONOSCOPY;  Surgeon: Rogene Houston, MD;  [facility];  Service: Endoscopy;  Laterality: N/A;  9:15-moved to 1015 Ann to notify pt   COLONOSCOPY WITH PROPOFOL N/A 06/12/2020   Procedure: COLONOSCOPY WITH PROPOFOL;  Surgeon: Rogene Houston, MD;  [facility];  Service: Endoscopy;  Laterality: N/A;  830   CORONARY ANGIOPLASTY WITH STENT PLACEMENT  10/2019   ESOPHAGOGASTRODUODENOSCOPY (EGD) WITH PROPOFOL N/A 06/12/2020  Procedure: ESOPHAGOGASTRODUODENOSCOPY (EGD) WITH PROPOFOL;  Surgeon: Rogene Houston, MD;  [facility];  Service: Endoscopy;  Laterality: N/A;   POLYPECTOMY  06/12/2020   Procedure: POLYPECTOMY;  Surgeon: Rogene Houston, MD;  [facility];  Service: Endoscopy;;  gatric   repair of anal fissure     spincterotomy   SPINE  SURGERY N/A 03/31/2021   spine stimulator   TOTAL HIP ARTHROPLASTY Left 06/26/2020   Procedure: TOTAL HIP ARTHROPLASTY ANTERIOR APPROACH;  Surgeon: Gaynelle Arabian, MD;  Location: [facility];  Service: Orthopedics;  Laterality: Left;  196mn    Family History: Family History  Problem Relation Age of Onset   Diabetes Mother    Colon cancer Father     Social History: Social History   Tobacco Use  Smoking Status Never  Smokeless Tobacco Never   Social History   Substance and Sexual Activity  Alcohol Use No   Alcohol/week: 0.0 standard drinks of alcohol   Social History   Substance and Sexual Activity  Drug Use No    Allergies: Allergies  Allergen Reactions   Codeine Itching   Orange Fruit [Citrus] Itching   Bee Venom Rash    Medications: Current Outpatient Medications  Medication Sig Dispense Refill   acetaminophen (TYLENOL) 500 MG tablet Take 1,000 mg by mouth every 8 (eight) hours as needed for mild pain or moderate pain.     aspirin EC 81 MG tablet Take 81 mg by mouth daily. Swallow whole.     buprenorphine (BUTRANS) 5 MCG/HR PTWK Place 1 patch onto the skin once a week.     Calcium Carbonate Antacid (TUMS PO) Take by mouth. Takes prn     Calcium-Magnesium-Zinc (CAL-MAG-ZINC PO) Take 1 tablet by mouth at bedtime.     carvedilol (COREG) 6.25 MG tablet Take 6.25 mg by mouth 2 (two) times daily with a meal.      Cholecalciferol 5000 units TABS Take 5,000 Units by mouth daily.      cyanocobalamin 1000 MCG tablet Take 1,000 mcg by mouth daily.     diclofenac Sodium (VOLTAREN) 1 % GEL Apply 1 application topically daily as needed (pain).      DULoxetine (CYMBALTA) 30 MG capsule Take 30 mg by mouth daily.     Ferrous Sulfate (IRON) 325 (65 Fe) MG TABS Take by mouth. One daily     Fish Oil-Cholecalciferol (FISH OIL + D3 PO) Take 1,400 mg by mouth daily.      gabapentin (NEURONTIN) 300 MG capsule Take 300 mg by mouth at bedtime. (Patient not taking: Reported on 06/01/2022)      Melatonin 10 MG TABS Take 10 mg by mouth at bedtime.     mesalamine (PENTASA) 500 MG CR capsule Take 2 capsules (1,000 mg total) by mouth 4 (four) times daily. (Patient taking differently: Take 1,000 mg by mouth 3 (three) times daily.) 240 capsule 11   Multiple Vitamin (MULTIVITAMIN WITH MINERALS) TABS tablet Take 1 tablet by mouth daily.     olive oil external oil Apply topically as needed. Patient takes 1 by mouth daily. This is a capsule.     pantoprazole (PROTONIX) 40 MG tablet TAKE 1 TABLET BY MOUTH  DAILY 100 tablet 0   pregabalin (LYRICA) 100 MG capsule Take 100 mg by mouth 2 (two) times daily.     rosuvastatin (CRESTOR) 20 MG tablet Take 20 mg by mouth daily.     tamsulosin (FLOMAX) 0.4 MG CAPS capsule Take 0.4 mg by mouth in  the morning and at bedtime. One in am and two QHS.     Turmeric (QC TUMERIC COMPLEX PO) Take by mouth daily.     No current facility-administered medications for this visit.    Review of Systems: GENERAL: negative for malaise, night sweats HEENT: No changes in hearing or vision, no nose bleeds or other nasal problems. NECK: Negative for lumps, goiter, pain and significant neck swelling RESPIRATORY: Negative for cough, wheezing CARDIOVASCULAR: Negative for chest pain, leg swelling, palpitations, orthopnea GI: SEE HPI MUSCULOSKELETAL: Negative for joint pain or swelling, back pain, and muscle pain. SKIN: Negative for lesions, rash PSYCH: Negative for sleep disturbance, mood disorder and recent psychosocial stressors. HEMATOLOGY Negative for prolonged bleeding, bruising easily, and swollen nodes. ENDOCRINE: Negative for cold or heat intolerance, polyuria, polydipsia and goiter. NEURO: negative for tremor, gait imbalance, syncope and seizures. The remainder of the review of systems is noncontributory.   Physical Exam: BP (!) 170/84 (BP Location: Left Arm, Patient Position: Sitting, Cuff Size: Large)   Pulse 79   Temp 98.7 F (37.1 C) (Oral)   Ht '6\' 4"'$   (1.93 m)   Wt 290 lb (131.5 kg)   BMI 35.30 kg/m  GENERAL: The patient is AO x3, in no acute distress. HEENT: Head is normocephalic and atraumatic. EOMI are intact. Mouth is well hydrated and without lesions. NECK: Supple. No masses LUNGS: Clear to auscultation. No presence of rhonchi/wheezing/rales. Adequate chest expansion HEART: RRR, normal s1 and s2. ABDOMEN: Soft, nontender, no guarding, no peritoneal signs, and nondistended. BS +. No masses. EXTREMITIES: Without any cyanosis, clubbing, rash, lesions or edema. NEUROLOGIC: AOx3, no focal motor deficit. SKIN: no jaundice, no rashes  Imaging/Labs: as above  I personally reviewed and interpreted the available labs, imaging and endoscopic files.  Impression and Plan: Bruce Stewart is a 76 y.o. male with PMH mild to moderate Crohn's ileitis, coronary artery disease, history of colon cancer, urolithiasis, who presents for follow up of Crohn's disease.  The patient has presented a relatively indolent disease with presence of mild to moderate endoscopic changes in the most recent colonoscopy.  He was initially considered in ileitis related to NSAIDs but biopsy showed epithelial changes consistent with small bowel Crohn's disease.  He is currently on mesalamine and has presented very sporadic symptoms.  We had a thorough discussion regarding the current need to determine if he achieved endoscopic hopefully histologic remission.  For this, we will need to proceed with a colonoscopy which the patient is agreeable to proceed with.  For now he will continue with mesalamine compounds.  We had a very thorough discussion about the advanced options for management of Crohn's disease. Bruce Stewart will require treatment with steroid-sparing medications. Discussion was held regarding benefits and side effects of immunomodulators/biologicals (including infections, malignancies such as lymphoma, skin cancer, tolerance to medication), as well as need  to control her disease clinically and endoscopically (ideally microscopically as well) to avoid recurrence of symptomatology.  He will reach his insurance to check which medications may be covered as a first-line for moderate Crohn's disease.  -Schedule colonoscopy -Patient will check with insurance which medication will be covered for Small bowel Crohn's disease - Clarita Leber, Skyrizi, Remicade, Humira or Cimzia. - Continue mesalamine 500 mg TID  All questions were answered.      Bruce Quale, MD Gastroenterology and Hepatology [facility]

## 2022-06-11 ENCOUNTER — Encounter (INDEPENDENT_AMBULATORY_CARE_PROVIDER_SITE_OTHER): Payer: Self-pay

## 2022-06-11 ENCOUNTER — Other Ambulatory Visit (INDEPENDENT_AMBULATORY_CARE_PROVIDER_SITE_OTHER): Payer: Self-pay

## 2022-06-11 ENCOUNTER — Other Ambulatory Visit (INDEPENDENT_AMBULATORY_CARE_PROVIDER_SITE_OTHER): Payer: Self-pay | Admitting: Internal Medicine

## 2022-06-11 DIAGNOSIS — K633 Ulcer of intestine: Secondary | ICD-10-CM

## 2022-06-16 ENCOUNTER — Telehealth (INDEPENDENT_AMBULATORY_CARE_PROVIDER_SITE_OTHER): Payer: Self-pay | Admitting: *Deleted

## 2022-06-16 NOTE — Telephone Encounter (Signed)
Patient called in - schd for TCS 07/03/22 - said he got notification in mail from [facility] that they had approved procedure, Mr Ratterree said this needs to be approved through [employer]

## 2022-07-01 ENCOUNTER — Encounter (HOSPITAL_COMMUNITY): Payer: Non-veteran care

## 2022-07-03 ENCOUNTER — Encounter (HOSPITAL_COMMUNITY): Admission: RE | Payer: Self-pay | Source: Home / Self Care

## 2022-07-03 ENCOUNTER — Ambulatory Visit (HOSPITAL_COMMUNITY): Admission: RE | Admit: 2022-07-03 | Payer: Medicare Other | Source: Home / Self Care | Admitting: Gastroenterology

## 2022-07-03 SURGERY — COLONOSCOPY WITH PROPOFOL
Anesthesia: Monitor Anesthesia Care

## 2022-07-28 DIAGNOSIS — E785 Hyperlipidemia, unspecified: Secondary | ICD-10-CM | POA: Diagnosis not present

## 2022-07-28 DIAGNOSIS — I10 Essential (primary) hypertension: Secondary | ICD-10-CM | POA: Diagnosis not present

## 2022-07-28 DIAGNOSIS — I2510 Atherosclerotic heart disease of native coronary artery without angina pectoris: Secondary | ICD-10-CM | POA: Diagnosis not present

## 2022-07-28 DIAGNOSIS — I350 Nonrheumatic aortic (valve) stenosis: Secondary | ICD-10-CM | POA: Diagnosis not present

## 2022-08-10 DIAGNOSIS — J340 Abscess, furuncle and carbuncle of nose: Secondary | ICD-10-CM | POA: Diagnosis not present

## 2022-08-10 DIAGNOSIS — G4700 Insomnia, unspecified: Secondary | ICD-10-CM | POA: Diagnosis not present

## 2022-08-27 ENCOUNTER — Telehealth (INDEPENDENT_AMBULATORY_CARE_PROVIDER_SITE_OTHER): Payer: Self-pay | Admitting: *Deleted

## 2022-08-27 NOTE — Telephone Encounter (Signed)
Patient left message to see if he was suppose to continue pantoprazole that dr Laural Golden gave him. I see Dr. Jenetta Downer did send in refills on 8/23. I left patient a message to call back to discuss that he needs to continue med.   [phone]

## 2022-08-27 NOTE — Telephone Encounter (Signed)
Patient notified

## 2022-08-31 NOTE — Telephone Encounter (Signed)
error 

## 2022-09-10 ENCOUNTER — Telehealth (INDEPENDENT_AMBULATORY_CARE_PROVIDER_SITE_OTHER): Payer: Self-pay | Admitting: *Deleted

## 2022-09-10 NOTE — Telephone Encounter (Signed)
Requesting refill on mesalamine to [facility]. Last seen in office 06/10/22. Patient said it was last filled by [facility] doctor but that dr has retired. I confirmed with him how he was taking because our med list has two different directions and He said he was taking mesalamine 1200 mg 3 daily.

## 2022-09-11 ENCOUNTER — Other Ambulatory Visit (INDEPENDENT_AMBULATORY_CARE_PROVIDER_SITE_OTHER): Payer: Self-pay | Admitting: Gastroenterology

## 2022-09-11 DIAGNOSIS — K5000 Crohn's disease of small intestine without complications: Secondary | ICD-10-CM

## 2022-09-11 MED ORDER — MESALAMINE 1.2 G PO TBEC: 3.6000 g | DELAYED_RELEASE_TABLET | Freq: Every day | ORAL | 2 refills | Status: DC

## 2022-09-11 NOTE — Telephone Encounter (Signed)
Patient aware med refilled and he states he has not checked with insurance yet. Will call back when he decides.

## 2022-09-11 NOTE — Telephone Encounter (Signed)
Will refill medication. We discussed the possibility of starting biologicals in last appointment, has he made his mind about this? Has he checked with his insurance?

## 2022-10-14 NOTE — Telephone Encounter (Signed)
Referral hasn't been rec'd, left patient a message advising him of this.

## 2022-10-14 NOTE — Telephone Encounter (Signed)
Pt called and left a VM stating the [facility] was supposed to send over his approval for him to have a colonoscopy done. Fowarding to [employer]

## 2022-11-10 ENCOUNTER — Telehealth: Payer: Self-pay | Admitting: *Deleted

## 2022-11-10 NOTE — Telephone Encounter (Signed)
Received [facility] referral auth. [place] pt, LMOVM to call back to schedule TCS with Dr. Jenetta Downer ASA 3

## 2022-11-11 NOTE — Telephone Encounter (Signed)
Called pt. He has been scheduled for 2/2 at 12:30pm with Dr. Jenetta Downer. Aware to hold iron x 10 days. He already his his prep (clenpiq) at home.

## 2022-11-12 ENCOUNTER — Encounter: Payer: Self-pay | Admitting: *Deleted

## 2022-11-19 DIAGNOSIS — R7301 Impaired fasting glucose: Secondary | ICD-10-CM | POA: Diagnosis not present

## 2022-11-19 DIAGNOSIS — N183 Chronic kidney disease, stage 3 unspecified: Secondary | ICD-10-CM | POA: Diagnosis not present

## 2022-11-19 DIAGNOSIS — K2100 Gastro-esophageal reflux disease with esophagitis, without bleeding: Secondary | ICD-10-CM | POA: Diagnosis not present

## 2022-11-26 DIAGNOSIS — R030 Elevated blood-pressure reading, without diagnosis of hypertension: Secondary | ICD-10-CM | POA: Diagnosis not present

## 2022-11-26 DIAGNOSIS — Z23 Encounter for immunization: Secondary | ICD-10-CM | POA: Diagnosis not present

## 2022-11-26 DIAGNOSIS — K5090 Crohn's disease, unspecified, without complications: Secondary | ICD-10-CM | POA: Diagnosis not present

## 2022-11-26 DIAGNOSIS — N1832 Chronic kidney disease, stage 3b: Secondary | ICD-10-CM | POA: Diagnosis not present

## 2022-11-26 DIAGNOSIS — M545 Low back pain, unspecified: Secondary | ICD-10-CM | POA: Diagnosis not present

## 2022-11-26 DIAGNOSIS — R197 Diarrhea, unspecified: Secondary | ICD-10-CM | POA: Diagnosis not present

## 2022-11-26 DIAGNOSIS — R7301 Impaired fasting glucose: Secondary | ICD-10-CM | POA: Diagnosis not present

## 2022-11-26 DIAGNOSIS — N138 Other obstructive and reflux uropathy: Secondary | ICD-10-CM | POA: Diagnosis not present

## 2022-11-30 NOTE — Patient Instructions (Signed)
Bruce Stewart  11/30/2022     '@PREFPERIOPPHARMACY'$ @   Your procedure is scheduled on  12/04/2022.   Report to [facility] at  1030  A.M.   Call this number if you have problems the morning of surgery:  [phone]  If you experience any cold or flu symptoms such as cough, fever, chills, shortness of breath, etc. between now and your scheduled surgery, please notify us at the above number.   Remember:  Follow the diet and prep instructions given to you by the office.     Take these medicines the morning of surgery with A SIP OF WATER      carvedilol, cymbalta, lialda, pantoprazole, lyrica, flomax.     Do not wear jewelry, make-up or nail polish.  Do not wear lotions, powders, or perfumes, or deodorant.  Do not shave 48 hours prior to surgery.  Men may shave face and neck.  Do not bring valuables to the hospital.  [facility] is not responsible for any belongings or valuables.  Contacts, dentures or bridgework may not be worn into surgery.  Leave your suitcase in the car.  After surgery it may be brought to your room.  For patients admitted to the hospital, discharge time will be determined by your treatment team.  Patients discharged the day of surgery will not be allowed to drive home and must have someone with them for 24 hours.    Special instructions:   DO NOT smoke tobacco or vape for 24 hours before your procedure.  Please read over the following fact sheets that you were given. Anesthesia Post-op Instructions and Care and Recovery After Surgery      Colonoscopy, Adult, Care After The following information offers guidance on how to care for yourself after your procedure. Your health care provider may also give you more specific instructions. If you have problems or questions, contact your health care provider. What can I expect after the procedure? After the procedure, it is common to have: A small amount of blood in your stool for 24 hours after the  procedure. Some gas. Mild cramping or bloating of your abdomen. Follow these instructions at home: Eating and drinking  Drink enough fluid to keep your urine pale yellow. Follow instructions from your health care provider about eating or drinking restrictions. Resume your normal diet as told by your health care provider. Avoid heavy or fried foods that are hard to digest. Activity Rest as told by your health care provider. Avoid sitting for a long time without moving. Get up to take short walks every 1-2 hours. This is important to improve blood flow and breathing. Ask for help if you feel weak or unsteady. Return to your normal activities as told by your health care provider. Ask your health care provider what activities are safe for you. Managing cramping and bloating  Try walking around when you have cramps or feel bloated. If directed, apply heat to your abdomen as told by your health care provider. Use the heat source that your health care provider recommends, such as a moist heat pack or a heating pad. Place a towel between your skin and the heat source. Leave the heat on for 20-30 minutes. Remove the heat if your skin turns bright red. This is especially important if you are unable to feel pain, heat, or cold. You have a greater risk of getting burned. General instructions If you were given a sedative during the procedure, it  can affect you for several hours. Do not drive or operate machinery until your health care provider says that it is safe. For the first 24 hours after the procedure: Do not sign important documents. Do not drink alcohol. Do your regular daily activities at a slower pace than normal. Eat soft foods that are easy to digest. Take over-the-counter and prescription medicines only as told by your health care provider. Keep all follow-up visits. This is important. Contact a health care provider if: You have blood in your stool 2-3 days after the procedure. Get  help right away if: You have more than a small spotting of blood in your stool. You have large blood clots in your stool. You have swelling of your abdomen. You have nausea or vomiting. You have a fever. You have increasing pain in your abdomen that is not relieved with medicine. These symptoms may be an emergency. Get help right away. Call 911. Do not wait to see if the symptoms will go away. Do not drive yourself to the hospital. Summary After the procedure, it is common to have a small amount of blood in your stool. You may also have mild cramping and bloating of your abdomen. If you were given a sedative during the procedure, it can affect you for several hours. Do not drive or operate machinery until your health care provider says that it is safe. Get help right away if you have a lot of blood in your stool, nausea or vomiting, a fever, or increased pain in your abdomen. This information is not intended to replace advice given to you by your health care provider. Make sure you discuss any questions you have with your health care provider. Document Revised: 06/11/2021 Document Reviewed: 06/11/2021 Elsevier Patient Education  [place] After The following information offers guidance on how to care for yourself after your procedure. Your health care provider may also give you more specific instructions. If you have problems or questions, contact your health care provider. What can I expect after the procedure? After the procedure, it is common to have: Tiredness. Little or no memory about what happened during or after the procedure. Impaired judgment when it comes to making decisions. Nausea or vomiting. Some trouble with balance. Follow these instructions at home: For the time period you were told by your health care provider:  Rest. Do not participate in activities where you could fall or become injured. Do not drive or use machinery. Do  not drink alcohol. Do not take sleeping pills or medicines that cause drowsiness. Do not make important decisions or sign legal documents. Do not take care of children on your own. Medicines Take over-the-counter and prescription medicines only as told by your health care provider. If you were prescribed antibiotics, take them as told by your health care provider. Do not stop using the antibiotic even if you start to feel better. Eating and drinking Follow instructions from your health care provider about what you may eat and drink. Drink enough fluid to keep your urine pale yellow. If you vomit: Drink clear fluids slowly and in small amounts as you are able. Clear fluids include water, ice chips, low-calorie sports drinks, and fruit juice that has water added to it (diluted fruit juice). Eat light and bland foods in small amounts as you are able. These foods include bananas, applesauce, rice, lean meats, toast, and crackers. General instructions  Have a responsible adult stay with you for the time you  are told. It is important to have someone help care for you until you are awake and alert. If you have sleep apnea, surgery and some medicines can increase your risk for breathing problems. Follow instructions from your health care provider about wearing your sleep device: When you are sleeping. This includes during daytime naps. While taking prescription pain medicines, sleeping medicines, or medicines that make you drowsy. Do not use any products that contain nicotine or tobacco. These products include cigarettes, chewing tobacco, and vaping devices, such as e-cigarettes. If you need help quitting, ask your health care provider. Contact a health care provider if: You feel nauseous or vomit every time you eat or drink. You feel light-headed. You are still sleepy or having trouble with balance after 24 hours. You get a rash. You have a fever. You have redness or swelling around the IV  site. Get help right away if: You have trouble breathing. You have new confusion after you get home. These symptoms may be an emergency. Get help right away. Call 911. Do not wait to see if the symptoms will go away. Do not drive yourself to the hospital. This information is not intended to replace advice given to you by your health care provider. Make sure you discuss any questions you have with your health care provider. Document Revised: 03/16/2022 Document Reviewed: 03/16/2022 Elsevier Patient Education  [place].

## 2022-12-01 ENCOUNTER — Encounter (HOSPITAL_COMMUNITY): Payer: Self-pay

## 2022-12-01 ENCOUNTER — Encounter (HOSPITAL_COMMUNITY)
Admission: RE | Admit: 2022-12-01 | Discharge: 2022-12-01 | Disposition: A | Discharge status: Home / Self Care | Payer: No Typology Code available for payment source | Source: Ambulatory Visit | Attending: Gastroenterology | Admitting: Gastroenterology

## 2022-12-01 VITALS — BP 146/72 | HR 64 | Temp 97.9°F | Resp 20 | Ht 76.0 in | Wt 285.0 lb

## 2022-12-01 DIAGNOSIS — I10 Essential (primary) hypertension: Secondary | ICD-10-CM | POA: Insufficient documentation

## 2022-12-01 DIAGNOSIS — D500 Iron deficiency anemia secondary to blood loss (chronic): Secondary | ICD-10-CM | POA: Diagnosis not present

## 2022-12-01 DIAGNOSIS — Z01818 Encounter for other preprocedural examination: Secondary | ICD-10-CM | POA: Diagnosis present

## 2022-12-01 HISTORY — DX: Personal history of urinary calculi: Z87.442

## 2022-12-01 LAB — CBC WITH DIFFERENTIAL/PLATELET
Abs Immature Granulocytes: 0.02 10*3/uL (ref 0.00–0.07)
Basophils Absolute: 0 10*3/uL (ref 0.0–0.1)
Basophils Relative: 0 %
Eosinophils Absolute: 0.1 10*3/uL (ref 0.0–0.5)
Eosinophils Relative: 2 %
HCT: 39.2 % (ref 39.0–52.0)
Hemoglobin: 12.3 g/dL — ABNORMAL LOW (ref 13.0–17.0)
Immature Granulocytes: 0 %
Lymphocytes Relative: 29 %
Lymphs Abs: 1.6 10*3/uL (ref 0.7–4.0)
MCH: 29.9 pg (ref 26.0–34.0)
MCHC: 31.4 g/dL (ref 30.0–36.0)
MCV: 95.4 fL (ref 80.0–100.0)
Monocytes Absolute: 0.4 10*3/uL (ref 0.1–1.0)
Monocytes Relative: 7 %
Neutro Abs: 3.3 10*3/uL (ref 1.7–7.7)
Neutrophils Relative %: 62 %
Platelets: 154 10*3/uL (ref 150–400)
RBC: 4.11 MIL/uL — ABNORMAL LOW (ref 4.22–5.81)
RDW: 14.8 % (ref 11.5–15.5)
WBC: 5.4 10*3/uL (ref 4.0–10.5)
nRBC: 0 % (ref 0.0–0.2)

## 2022-12-01 NOTE — Progress Notes (Signed)
   12/01/22 1142  OBSTRUCTIVE SLEEP APNEA  Have you ever been diagnosed with sleep apnea through a sleep study? No  Do you snore loudly (loud enough to be heard through closed doors)?  1  Do you often feel tired, fatigued, or sleepy during the daytime (such as falling asleep during driving or talking to someone)? 0  Has anyone observed you stop breathing during your sleep? 1  Do you have, or are you being treated for high blood pressure? 1  BMI more than 35 kg/m2? 0  Age > 50 (1-yes) 1  Neck circumference greater than:Male 16 inches or larger, Male 17inches or larger? 0  Male Gender (Yes=1) 1  Obstructive Sleep Apnea Score 5  Score 5 or greater  Results sent to PCP

## 2022-12-04 ENCOUNTER — Ambulatory Visit (HOSPITAL_COMMUNITY)
Admission: RE | Admit: 2022-12-04 | Discharge: 2022-12-04 | Disposition: A | Discharge status: Home / Self Care | Payer: No Typology Code available for payment source | Attending: Gastroenterology | Admitting: Gastroenterology

## 2022-12-04 ENCOUNTER — Ambulatory Visit (HOSPITAL_COMMUNITY): Payer: No Typology Code available for payment source | Admitting: Anesthesiology

## 2022-12-04 ENCOUNTER — Encounter (HOSPITAL_COMMUNITY)
Admission: RE | Disposition: A | Discharge status: Home / Self Care | Payer: Self-pay | Source: Home / Self Care | Attending: Gastroenterology

## 2022-12-04 DIAGNOSIS — I252 Old myocardial infarction: Secondary | ICD-10-CM | POA: Insufficient documentation

## 2022-12-04 DIAGNOSIS — K648 Other hemorrhoids: Secondary | ICD-10-CM | POA: Diagnosis not present

## 2022-12-04 DIAGNOSIS — Z800 Family history of malignant neoplasm of digestive organs: Secondary | ICD-10-CM

## 2022-12-04 DIAGNOSIS — K5730 Diverticulosis of large intestine without perforation or abscess without bleeding: Secondary | ICD-10-CM | POA: Insufficient documentation

## 2022-12-04 DIAGNOSIS — I2510 Atherosclerotic heart disease of native coronary artery without angina pectoris: Secondary | ICD-10-CM | POA: Insufficient documentation

## 2022-12-04 DIAGNOSIS — K5000 Crohn's disease of small intestine without complications: Secondary | ICD-10-CM

## 2022-12-04 DIAGNOSIS — Z85038 Personal history of other malignant neoplasm of large intestine: Secondary | ICD-10-CM | POA: Insufficient documentation

## 2022-12-04 DIAGNOSIS — Z955 Presence of coronary angioplasty implant and graft: Secondary | ICD-10-CM | POA: Diagnosis not present

## 2022-12-04 DIAGNOSIS — K529 Noninfective gastroenteritis and colitis, unspecified: Secondary | ICD-10-CM | POA: Diagnosis not present

## 2022-12-04 DIAGNOSIS — K5090 Crohn's disease, unspecified, without complications: Secondary | ICD-10-CM

## 2022-12-04 DIAGNOSIS — I452 Bifascicular block: Secondary | ICD-10-CM | POA: Insufficient documentation

## 2022-12-04 DIAGNOSIS — Z79899 Other long term (current) drug therapy: Secondary | ICD-10-CM | POA: Diagnosis not present

## 2022-12-04 DIAGNOSIS — M1990 Unspecified osteoarthritis, unspecified site: Secondary | ICD-10-CM | POA: Diagnosis not present

## 2022-12-04 HISTORY — PX: BIOPSY: SHX5522

## 2022-12-04 HISTORY — PX: COLONOSCOPY WITH PROPOFOL: SHX5780

## 2022-12-04 LAB — C-REACTIVE PROTEIN: CRP: 0.8 mg/dL (ref <1.0)

## 2022-12-04 LAB — HEPATITIS B SURFACE ANTIGEN: Hepatitis B Surface Ag: NONREACTIVE

## 2022-12-04 SURGERY — COLONOSCOPY WITH PROPOFOL
Anesthesia: General

## 2022-12-04 MED ORDER — LIDOCAINE 2% (20 MG/ML) 5 ML SYRINGE
INTRAMUSCULAR | Status: DC | PRN
  Administered 2022-12-04: 50 mg via INTRAVENOUS

## 2022-12-04 MED ORDER — PHENYLEPHRINE 80 MCG/ML (10ML) SYRINGE FOR IV PUSH (FOR BLOOD PRESSURE SUPPORT)
PREFILLED_SYRINGE | INTRAVENOUS | Status: DC | PRN
  Administered 2022-12-04: 80 ug via INTRAVENOUS

## 2022-12-04 MED ORDER — PROPOFOL 500 MG/50ML IV EMUL
INTRAVENOUS | Status: DC | PRN
  Administered 2022-12-04: 175 ug/kg/min via INTRAVENOUS

## 2022-12-04 MED ORDER — LACTATED RINGERS IV SOLN: INTRAVENOUS | Status: DC

## 2022-12-04 MED ORDER — PROPOFOL 10 MG/ML IV BOLUS
INTRAVENOUS | Status: DC | PRN
  Administered 2022-12-04: 60 mg via INTRAVENOUS

## 2022-12-04 NOTE — Anesthesia Postprocedure Evaluation (Signed)
Anesthesia Post Note  Patient: Bruce Stewart  Procedure(s) Performed: COLONOSCOPY WITH PROPOFOL BIOPSY  Patient location during evaluation: Phase II Anesthesia Type: General Level of consciousness: awake and alert and oriented Pain management: pain level controlled Vital Signs Assessment: post-procedure vital signs reviewed and stable Respiratory status: spontaneous breathing, nonlabored ventilation and respiratory function stable Cardiovascular status: blood pressure returned to baseline and stable Postop Assessment: no apparent nausea or vomiting Anesthetic complications: no  No notable events documented.   Last Vitals:  Vitals:   12/04/22 1300 12/04/22 1322  BP: 106/67 (!) 145/72  Pulse: 95 75  Resp: 20   Temp: 37 C   SpO2: 91% 94%    Last Pain:  Vitals:   12/04/22 1322  TempSrc:   PainSc: 0-No pain                 Coron Rossano C Karyss Frese

## 2022-12-04 NOTE — H&P (Signed)
Bruce Stewart is an 77 y.o. male.   Chief Complaint: Abdominal pain and diarrhea, history of Crohn's disease HPI: Bruce Stewart is a 77 y.o. male with PMH moderate Crohn's ileitis, coronary artery disease, history of colon cancer, urolithiasis, who presents for follow up of Crohn's disease.  Patient reports that he has presented intermittent episodes of abdominal pain his abdomen.  Has had some episodes of watery diarrhea without blood in his stool.  Denies any nausea, vomiting, fever, chills, weight loss.  Past Medical History:  Diagnosis Date   Anal fissure    Anemia    Arthritis    Complication of anesthesia    Coronary artery disease    Diverticulitis    Family hx of colon cancer    History of kidney stones    Kidney stone    Myocardial infarction ([place]) 10/2019   PONV (postoperative nausea and vomiting)     Past Surgical History:  Procedure Laterality Date   back surgery x 6 Other]     BIOPSY  02/11/2018   Procedure: BIOPSY;  Surgeon: Rogene Houston, MD;  Location: [facility];  Service: Endoscopy;;  ileal   BIOPSY  06/12/2020   Procedure: BIOPSY;  Surgeon: Rogene Houston, MD;  Location: [facility];  Service: Endoscopy;;  antral, ileal   CARDIAC CATHETERIZATION  10/2019   CIRCUMCISION     COLONOSCOPY N/A 09/12/2015   Procedure: COLONOSCOPY;  Surgeon: Rogene Houston, MD;  Location: [facility];  Service: Endoscopy;  Laterality: N/A;  2:25-moved up to 71 Ann notified pt   COLONOSCOPY N/A 02/11/2018   Procedure: COLONOSCOPY;  Surgeon: Rogene Houston, MD;  Location: [facility];  Service: Endoscopy;  Laterality: N/A;  9:15-moved to 1015 Ann to notify pt   COLONOSCOPY WITH PROPOFOL N/A 06/12/2020   Procedure: COLONOSCOPY WITH PROPOFOL;  Surgeon: Rogene Houston, MD;  Location: [facility];  Service: Endoscopy;  Laterality: N/A;  [place] WITH STENT PLACEMENT  10/2019   ESOPHAGOGASTRODUODENOSCOPY (EGD) WITH PROPOFOL N/A 06/12/2020    Procedure: ESOPHAGOGASTRODUODENOSCOPY (EGD) WITH PROPOFOL;  Surgeon: Rogene Houston, MD;  [facility]: [facility];  Service: Endoscopy;  Laterality: N/A;   POLYPECTOMY  06/12/2020   Procedure: POLYPECTOMY;  Surgeon: Rogene Houston, MD;  Location: [facility];  Service: Endoscopy;;  gatric   repair of anal fissure     spincterotomy   SPINAL CORD STIMULATOR IMPLANT     SPINE SURGERY N/A 03/31/2021   spine stimulator   TOTAL HIP ARTHROPLASTY Left 06/26/2020   Procedure: TOTAL HIP ARTHROPLASTY ANTERIOR APPROACH;  Surgeon: Gaynelle Arabian, MD;  Location: [facility];  Service: Orthopedics;  Laterality: Left;  167mn    Family History  Problem Relation Age of Onset   Diabetes Mother    Colon cancer Father    Social History:  reports that he has never smoked. He has never used smokeless tobacco. He reports that he does not drink alcohol and does not use drugs.  Allergies:  Allergies  Allergen Reactions   Codeine Itching   Orange Fruit [Citrus] Itching   Bee Venom Rash    Medications Prior to Admission  Medication Sig Dispense Refill   acetaminophen (TYLENOL) 500 MG tablet Take 1,000 mg by mouth every 8 (eight) hours as needed for mild pain or moderate pain.     aspirin EC 81 MG tablet Take 81 mg by mouth daily. Swallow whole.     buprenorphine (BUTRANS) 10 MCG/HR  Chesaning Place 1 patch onto the skin once a week.     Calcium Carbonate Antacid (TUMS PO) Take 1 tablet by mouth daily as needed (heartburn).     Calcium-Magnesium-Zinc (CAL-MAG-ZINC PO) Take 1 tablet by mouth at bedtime.     carboxymethylcellulose (REFRESH PLUS) 0.5 % SOLN Place 1 drop into both eyes 3 (three) times daily as needed (dry eyes).     carvedilol (COREG) 6.25 MG tablet Take 6.25 mg by mouth 2 (two) times daily with a meal.      Cholecalciferol 5000 units TABS Take 5,000 Units by mouth daily.      cyanocobalamin 1000 MCG tablet Take 1,000 mcg by mouth daily.     diclofenac Sodium (VOLTAREN) 1 % GEL Apply 1 application  topically daily as needed (pain).      DULoxetine (CYMBALTA) 30 MG capsule Take 30 mg by mouth daily.     Fish Oil-Cholecalciferol (FISH OIL + D3 PO) Take 1,400 mg by mouth daily.      Melatonin 10 MG TABS Take 10 mg by mouth at bedtime.     mesalamine (LIALDA) 1.2 g EC tablet Take 3 tablets (3.6 g total) by mouth daily with breakfast. (Patient taking differently: Take 1.2 g by mouth in the morning, at noon, and at bedtime.) 270 tablet 2   Multiple Vitamin (MULTIVITAMIN WITH MINERALS) TABS tablet Take 1 tablet by mouth daily.     OVER THE COUNTER MEDICATION Take 1 tablet by mouth daily. Keto plus ACV Gummie     pantoprazole (PROTONIX) 40 MG tablet TAKE 1 TABLET BY MOUTH DAILY 100 tablet 2   pregabalin (LYRICA) 100 MG capsule Take 100 mg by mouth 3 (three) times daily.     rosuvastatin (CRESTOR) 20 MG tablet Take 20 mg by mouth daily.     tamsulosin (FLOMAX) 0.4 MG CAPS capsule Take 0.4 mg by mouth in the morning and at bedtime.     traZODone (DESYREL) 50 MG tablet Take 50 mg by mouth at bedtime.     Turmeric (QC TUMERIC COMPLEX PO) Take 1 capsule by mouth daily.     Ferrous Sulfate (IRON) 325 (65 Fe) MG TABS Take 325 mg by mouth daily.      No results found for this or any previous visit (from the past 48 hour(s)). No results found.  Review of Systems  Gastrointestinal:  Positive for abdominal pain and diarrhea.  All other systems reviewed and are negative.   Blood pressure (!) 160/82, pulse 78, temperature 98.8 F (37.1 C), temperature source Oral, resp. rate 18, SpO2 94 %. Physical Exam  GENERAL: The patient is AO x3, in no acute distress. HEENT: Head is normocephalic and atraumatic. EOMI are intact. Mouth is well hydrated and without lesions. NECK: Supple. No masses LUNGS: Clear to auscultation. No presence of rhonchi/wheezing/rales. Adequate chest expansion HEART: RRR, normal s1 and s2. ABDOMEN: Soft, nontender, no guarding, no peritoneal signs, and nondistended. BS +. No  masses. EXTREMITIES: Without any cyanosis, clubbing, rash, lesions or edema. NEUROLOGIC: AOx3, no focal motor deficit. SKIN: no jaundice, no rashes  Assessment/Plan Bruce Stewart is a 77 y.o. male with PMH moderate Crohn's ileitis, coronary artery disease, history of colon cancer, urolithiasis, who presents for follow up of Crohn's disease.  Will proceed with colonoscopy.  Harvel Quale, MD 12/04/2022, 11:49 AM

## 2022-12-04 NOTE — Op Note (Signed)
[facility] Patient Name: Bruce Stewart Procedure Date: 12/04/2022 12:11 PM MRN: [id number] Date of Birth: 01-18-1946 Attending MD: Maylon Peppers , , [id number] CSN: [id number] Age: 77 Admit Type: Outpatient Procedure:                Colonoscopy Indications:              Follow-up of Crohn's disease of the small bowel Providers:                Maylon Peppers, Caprice Kluver, Ladoris Gene                            Technician, Technician Referring MD:              Medicines:                Monitored Anesthesia Care Complications:            No immediate complications. Estimated Blood Loss:     Estimated blood loss: none. Procedure:                Pre-Anesthesia Assessment:                           - Prior to the procedure, a History and Physical                            was performed, and patient medications, allergies                            and sensitivities were reviewed. The patient's                            tolerance of previous anesthesia was reviewed.                           - The risks and benefits of the procedure and the                            sedation options and risks were discussed with the                            patient. All questions were answered and informed                            consent was obtained.                           - ASA Grade Assessment: III - A patient with severe                            systemic disease.                           After obtaining informed consent, the colonoscope                            was passed under direct vision. Throughout the  procedure, the patient's blood pressure, pulse, and                            oxygen saturations were monitored continuously. The                            [id number]-[id number] ([id number]) was introduced through the                            anus and advanced to the the terminal ileum. The                            colonoscopy was performed without  difficulty. The                            patient tolerated the procedure well. Scope In: 12:28:59 PM Scope Out: 12:51:45 PM Scope Withdrawal Time: 0 hours 16 minutes 19 seconds  Total Procedure Duration: 0 hours 22 minutes 46 seconds  Findings:      The perianal and digital rectal examinations were normal.      Diffuse inflammation characterized by altered vascularity, congestion       (edema), erythema and aphthous ulcerations was found in the terminal       ileum. The inflammation was moderate in severity. Biopsies were taken       with a cold forceps for histology.      A few small-mouthed diverticula were found in the sigmoid colon and       descending colon.      Non-bleeding internal hemorrhoids were found during retroflexion. The       hemorrhoids were small. Impression:               - Crohn's disease with ileitis. Inflammation was                            found. This was moderate in severity. Biopsied.                           - Diverticulosis in the sigmoid colon and in the                            descending colon.                           - Non-bleeding internal hemorrhoids. Moderate Sedation:      Per Anesthesia Care      Per Anesthesia Care Recommendation:           - Discharge patient to home (ambulatory).                           - Resume previous diet.                           - Await pathology results.                           - Continue present medications.                           -  Check CRP, hep B s Ag and Quantiferon                           - Read about Clarita Leber, Skyrizi, Remicade,                            Humira or Cimzia - will need to make a decision                            regarding which medication he can take to control                            his disease.                           - Repeat colonoscopy 6 months after starting                            medication to evaluate the response to therapy. Procedure Code(s):         --- Professional ---                           [phone], Colonoscopy, flexible; with biopsy, single                            or multiple Diagnosis Code(s):        --- Professional ---                           K50.00, Crohn's disease of small intestine without                            complications                           K64.8, Other hemorrhoids                           K57.30, Diverticulosis of large intestine without                            perforation or abscess without bleeding CPT copyright 2022 [employer]. All rights reserved. The codes documented in this report are preliminary and upon coder review may  be revised to meet current compliance requirements. Maylon Peppers, MD Maylon Peppers,  12/04/2022 1:00:21 PM This report has been signed electronically. Number of Addenda: 0

## 2022-12-04 NOTE — Transfer of Care (Signed)
Immediate Anesthesia Transfer of Care Note  Patient: Bruce Stewart  Procedure(s) Performed: COLONOSCOPY WITH PROPOFOL BIOPSY  Patient Location: PACU  Anesthesia Type:General  Level of Consciousness: awake  Airway & Oxygen Therapy: Patient Spontanous Breathing  Post-op Assessment: Report given to RN and Post -op Vital signs reviewed and stable  Post vital signs: Reviewed and stable  Last Vitals:  Vitals Value Taken Time  BP    Temp    Pulse    Resp    SpO2      Last Pain:  Vitals:   12/04/22 1222  TempSrc:   PainSc: 0-No pain      Patients Stated Pain Goal: 5 (33/91/79 2178)  Complications: No notable events documented.

## 2022-12-04 NOTE — Discharge Instructions (Addendum)
You are being discharged to home.  Resume your previous diet.  We are waiting for your pathology results.  Continue your present medications.  Read about Clarita Leber, Skyrizi, Remicade, Humira or Cimzia - will need to make a decision regarding which medication he can take to control your disease. Your physician has recommended a repeat colonoscopy 6 months after starting medication to evaluate the response to therapy.

## 2022-12-04 NOTE — Anesthesia Preprocedure Evaluation (Addendum)
Anesthesia Evaluation  Patient identified by MRN, date of birth, ID band Patient awake    Reviewed: Allergy & Precautions, H&P , NPO status , Patient's Chart, lab work & pertinent test results, reviewed documented beta blocker date and time   History of Anesthesia Complications (+) PONV and history of anesthetic complications  Airway Mallampati: I  TM Distance: >3 FB Neck ROM: Full    Dental  (+) Dental Advisory Given   Pulmonary neg pulmonary ROS   Pulmonary exam normal breath sounds clear to auscultation       Cardiovascular Exercise Tolerance: Good (-) hypertensionPt. on home beta blockers + CAD, + Past MI and + Cardiac Stents  + dysrhythmias (PVCs on preop 3 lead EKG)  Rhythm:Irregular Rate:Normal + Systolic murmurs [id number] 11:29:49 [facility] ROUTINE RECORD 03-11-1946 (26 yr) Male Caucasian Vent. rate 67 BPM PR interval 168 ms QRS duration 164 ms QT/QTcB 406/429 ms P-R-T axes 41 -64 -6 Normal sinus rhythm Right bundle branch block Left anterior fascicular block  Bifascicular block  Minimal voltage criteria for LVH, may be normal variant ( R in aVL ) Septal infarct , age undetermined Abnormal ECG When compared with ECG of 10-Jun-2020 11:04, PREVIOUS ECG IS PRESENT Left ventricular hypertrophy New since previous tracing ... Referred by: Consuello Masse Confirmed By: Kirk Ruths   Neuro/Psych negative neurological ROS  negative psych ROS   GI/Hepatic Neg liver ROS, Bowel prep,,,Crohn's disease   Endo/Other  negative endocrine ROS    Renal/GU Renal disease  negative genitourinary   Musculoskeletal  (+) Arthritis , Osteoarthritis,    Abdominal   Peds negative pediatric ROS (+)  Hematology  (+) Blood dyscrasia, anemia   Anesthesia Other Findings Spinal cord stimulator Back sx  Reproductive/Obstetrics negative OB ROS                              Anesthesia Physical Anesthesia Plan  ASA: 3  Anesthesia Plan: General   Post-op Pain Management: Minimal or no pain anticipated   Induction: Intravenous  PONV Risk Score and Plan: Treatment may vary due to age or medical condition  Airway Management Planned: Nasal Cannula and Natural Airway  Additional Equipment:   Intra-op Plan:   Post-operative Plan:   Informed Consent: I have reviewed the patients History and Physical, chart, labs and discussed the procedure including the risks, benefits and alternatives for the proposed anesthesia with the patient or authorized representative who has indicated his/her understanding and acceptance.     Dental advisory given  Plan Discussed with: CRNA and Surgeon  Anesthesia Plan Comments:         Anesthesia Quick Evaluation

## 2022-12-07 LAB — SURGICAL PATHOLOGY

## 2022-12-09 LAB — QUANTIFERON-TB GOLD PLUS (RQFGPL)
QuantiFERON Mitogen Value: 2.7 IU/mL
QuantiFERON Nil Value: 0.03 IU/mL
QuantiFERON TB1 Ag Value: 0.03 IU/mL
QuantiFERON TB2 Ag Value: 0.04 IU/mL

## 2022-12-09 LAB — QUANTIFERON-TB GOLD PLUS: QuantiFERON-TB Gold Plus: NEGATIVE

## 2022-12-10 ENCOUNTER — Encounter (INDEPENDENT_AMBULATORY_CARE_PROVIDER_SITE_OTHER): Payer: Self-pay | Admitting: Gastroenterology

## 2022-12-10 ENCOUNTER — Ambulatory Visit (INDEPENDENT_AMBULATORY_CARE_PROVIDER_SITE_OTHER): Payer: No Typology Code available for payment source | Admitting: Gastroenterology

## 2022-12-10 VITALS — BP 129/72 | HR 85 | Temp 97.8°F | Ht 76.0 in | Wt 288.1 lb

## 2022-12-10 DIAGNOSIS — K50019 Crohn's disease of small intestine with unspecified complications: Secondary | ICD-10-CM | POA: Diagnosis not present

## 2022-12-10 NOTE — Patient Instructions (Addendum)
Will start authorization process for Stelara induction 520 mg and maintenance 90 mg every 8 weeks Stop mesalamine once you receive the first dose of Stelara

## 2022-12-10 NOTE — Progress Notes (Signed)
Maylon Peppers, M.D. Gastroenterology & Hepatology [facility] [address] [place], [place] [address]  Primary Care Physician: Manon Hilding, MD [place] [address]  I will communicate my assessment and recommendations to the referring MD via EMR.  Problems: moderate Crohn's ileitis  History of Present Illness: Bruce Stewart is a 77 y.o. male with PMH moderate Crohn's ileitis, coronary artery disease, history of colon cancer, urolithiasis, who presents for follow up of Crohn's disease.   The patient was last seen on 06/10/2022. At that time, the patient was advised to schedule colonoscopy and he was also advised to read about the potential medications he could use for management of his chronic disease instead of mesalamine (this medication was continued in the interim).  Patient underwent colonoscopy on 12/04/2022 which showed presence of inflammation, aphthous ulcers, edema and erythema in the terminal ileum which was moderate in severity consistent with active small bowel Crohn's disease (biopsies consistent with this), there was diverticulosis in the sigmoid and descending colon, as well as internal hemorrhoids.  Patient reports feeling well. States once a week he may have >3 bowel movements per day, for which he takes Imodium (may take up to 3 pills per day). States stools are usually mushy but not watery. Can have 1-5 Bms per day. No nighttime symptoms. Sometimes can have some pain in the RLQ when have a BM - usually lasts for 10 minutes. The patient denies having any nausea, vomiting, fever, chills, hematochezia, melena, hematemesis, abdominal distention, abdominal pain, jaundice, pruritus or weight loss.   Last EGD: 06/12/2020, normal esophagus, erythema granularity in the antrum (moderate gastropathy with negative H. pylori staining), with presence of 2 polyps in the gastric fundus (biopsies showed fundic gland polyps), normal  duodenal bulb.  Last Colonoscopy: as above  Past Medical History: Past Medical History:  Diagnosis Date   Anal fissure    Anemia    Arthritis    Complication of anesthesia    Coronary artery disease    Diverticulitis    Family hx of colon cancer    History of kidney stones    Kidney stone    Myocardial infarction ([place]) 10/2019   PONV (postoperative nausea and vomiting)     Past Surgical History: Past Surgical History:  Procedure Laterality Date   back surgery x 6 Other]     BIOPSY  02/11/2018   Procedure: BIOPSY;  Surgeon: Rogene Houston, MD;  [facility];  Service: Endoscopy;;  ileal   BIOPSY  06/12/2020   Procedure: BIOPSY;  Surgeon: Rogene Houston, MD;  [facility];  Service: Endoscopy;;  antral, ileal   CARDIAC CATHETERIZATION  10/2019   CIRCUMCISION     COLONOSCOPY N/A 09/12/2015   Procedure: COLONOSCOPY;  Surgeon: Rogene Houston, MD;  [facility];  Service: Endoscopy;  Laterality: N/A;  2:25-moved up to 11 Ann notified pt   COLONOSCOPY N/A 02/11/2018   Procedure: COLONOSCOPY;  Surgeon: Rogene Houston, MD;  [facility];  Service: Endoscopy;  Laterality: N/A;  9:15-moved to 1015 Ann to notify pt   COLONOSCOPY WITH PROPOFOL N/A 06/12/2020   Procedure: COLONOSCOPY WITH PROPOFOL;  Surgeon: Rogene Houston, MD;  [facility];  Service: Endoscopy;  Laterality: N/A;  [place] WITH STENT PLACEMENT  10/2019   ESOPHAGOGASTRODUODENOSCOPY (EGD) WITH PROPOFOL N/A 06/12/2020   Procedure: ESOPHAGOGASTRODUODENOSCOPY (EGD) WITH PROPOFOL;  Surgeon: Rogene Houston,  MD;  [facility];  Service: Endoscopy;  Laterality: N/A;   POLYPECTOMY  06/12/2020   Procedure: POLYPECTOMY;  Surgeon: Rogene Houston, MD;  [facility];  Service: Endoscopy;;  gatric   repair of anal fissure     spincterotomy   SPINAL CORD STIMULATOR IMPLANT     SPINE SURGERY N/A 03/31/2021   spine stimulator    TOTAL HIP ARTHROPLASTY Left 06/26/2020   Procedure: TOTAL HIP ARTHROPLASTY ANTERIOR APPROACH;  Surgeon: Gaynelle Arabian, MD;  Location: [facility];  Service: Orthopedics;  Laterality: Left;  157mn    Family History: Family History  Problem Relation Age of Onset   Diabetes Mother    Colon cancer Father     Social History: Social History   Tobacco Use  Smoking Status Never  Smokeless Tobacco Never   Social History   Substance and Sexual Activity  Alcohol Use No   Alcohol/week: 0.0 standard drinks of alcohol   Social History   Substance and Sexual Activity  Drug Use No    Allergies: Allergies  Allergen Reactions   Codeine Itching   Orange Fruit [Citrus] Itching   Bee Venom Rash    Medications: Current Outpatient Medications  Medication Sig Dispense Refill   acetaminophen (TYLENOL) 500 MG tablet Take 1,000 mg by mouth every 8 (eight) hours as needed for mild pain or moderate pain.     aspirin EC 81 MG tablet Take 81 mg by mouth daily. Swallow whole.     buprenorphine (BUTRANS) 10 MCG/HR PTWK Place 1 patch onto the skin once a week.     Calcium Carbonate Antacid (TUMS PO) Take 1 tablet by mouth daily as needed (heartburn).     Calcium-Magnesium-Zinc (CAL-MAG-ZINC PO) Take 1 tablet by mouth at bedtime.     carboxymethylcellulose (REFRESH PLUS) 0.5 % SOLN Place 1 drop into both eyes 3 (three) times daily as needed (dry eyes).     carvedilol (COREG) 6.25 MG tablet Take 6.25 mg by mouth 2 (two) times daily with a meal.      Cholecalciferol 5000 units TABS Take 5,000 Units by mouth daily.      cyanocobalamin 1000 MCG tablet Take 1,000 mcg by mouth daily.     diclofenac Sodium (VOLTAREN) 1 % GEL Apply 1 application topically daily as needed (pain).      DULoxetine (CYMBALTA) 30 MG capsule Take 30 mg by mouth daily.     Fish Oil-Cholecalciferol (FISH OIL + D3 PO) Take 1,400 mg by mouth daily.      Melatonin 10 MG TABS Take 10 mg by mouth at bedtime.     mesalamine (LIALDA) 1.2 g  EC tablet Take 3 tablets (3.6 g total) by mouth daily with breakfast. (Patient taking differently: Take 1.2 g by mouth in the morning, at noon, and at bedtime.) 270 tablet 2   Multiple Vitamin (MULTIVITAMIN WITH MINERALS) TABS tablet Take 1 tablet by mouth daily.     OVER THE COUNTER MEDICATION Take 1 tablet by mouth daily. Keto plus ACV Gummie     pantoprazole (PROTONIX) 40 MG tablet TAKE 1 TABLET BY MOUTH DAILY 100 tablet 2   pregabalin (LYRICA) 100 MG capsule Take 100 mg by mouth 3 (three) times daily.     rosuvastatin (CRESTOR) 20 MG tablet Take 20 mg by mouth daily.     tamsulosin (FLOMAX) 0.4 MG CAPS capsule Take 0.4 mg by mouth in the morning and at bedtime.     traZODone (DESYREL) 50 MG tablet Take  50 mg by mouth at bedtime.     Turmeric (QC TUMERIC COMPLEX PO) Take 1 capsule by mouth daily.     No current facility-administered medications for this visit.    Review of Systems: GENERAL: negative for malaise, night sweats HEENT: No changes in hearing or vision, no nose bleeds or other nasal problems. NECK: Negative for lumps, goiter, pain and significant neck swelling RESPIRATORY: Negative for cough, wheezing CARDIOVASCULAR: Negative for chest pain, leg swelling, palpitations, orthopnea GI: SEE HPI MUSCULOSKELETAL: Negative for joint pain or swelling, back pain, and muscle pain. SKIN: Negative for lesions, rash PSYCH: Negative for sleep disturbance, mood disorder and recent psychosocial stressors. HEMATOLOGY Negative for prolonged bleeding, bruising easily, and swollen nodes. ENDOCRINE: Negative for cold or heat intolerance, polyuria, polydipsia and goiter. NEURO: negative for tremor, gait imbalance, syncope and seizures. The remainder of the review of systems is noncontributory.   Physical Exam: BP 129/72 (BP Location: Left Arm, Patient Position: Sitting, Cuff Size: Large)   Pulse 85   Temp 97.8 F (36.6 C) (Temporal)   Ht '6\' 4"'$  (1.93 m)   Wt 288 lb 1.6 oz (130.7 kg)   BMI  35.07 kg/m  GENERAL: The patient is AO x3, in no acute distress. Obese. HEENT: Head is normocephalic and atraumatic. EOMI are intact. Mouth is well hydrated and without lesions. NECK: Supple. No masses LUNGS: Clear to auscultation. No presence of rhonchi/wheezing/rales. Adequate chest expansion HEART: RRR, normal s1 and s2. ABDOMEN: Soft, nontender, no guarding, no peritoneal signs, and nondistended. BS +. No masses. EXTREMITIES: Without any cyanosis, clubbing, rash, lesions or edema. NEUROLOGIC: AOx3, no focal motor deficit. SKIN: no jaundice, no rashes  Imaging/Labs: as above  I personally reviewed and interpreted the available labs, imaging and endoscopic files.  Impression and Plan: Bruce Stewart is a 77 y.o. male with PMH moderate Crohn's ileitis, coronary artery disease, history of colon cancer, urolithiasis, who presents for follow up of Crohn's disease.  The patient has presented chronic history of Crohn's disease that has only been managed with mesalamine compounds.  Most recent colonoscopy shows presence of active moderate chronic disease in his small bowel -as expected, clearly that mesalamine compounds are not controlling his disease and the findings are worse compared to prior.  Bruce Stewart will require treatment with steroid-sparing medications. Discussion was held regarding benefits and side effects of immunomodulators/biologicals (including infections, malignancies such as lymphoma, skin cancer, tolerance to medication), as well as need to control her disease clinically and endoscopically (ideally microscopically as well) to avoid recurrence of her symptomatology.  We thoroughly discussed for several minutes the different options available for management of his disease and the consequences of long-term inflammation.  The patient will like to try either Clarita Leber or Skyrizi given the safety profile -from these medications, we chose to start authorization for  Stelara given the possibility of using a subcutaneous injection with only 1 IV infusion dose.  He reported that his [place] can get a medication for lower price.  Will try to reach his pharmacy to determine which infusion center the [place] uses, but will also reach [facility] to try to obtain the cheapest option for the patient.  For now, he can continue taking tizanidine until the day he receives his initial dose of Stelara .  -Will start authorization process for Stelara induction 520 mg and maintenance 90 mg every 8 weeks  -Stop mesalamine once he receives the first dose of Stelara - RTC 3 months  All questions  were answered.      Total time spent during the current visit 42 minutes  Maylon Peppers, MD [facility]

## 2022-12-11 ENCOUNTER — Encounter (HOSPITAL_COMMUNITY): Payer: Self-pay | Admitting: Gastroenterology

## 2022-12-11 ENCOUNTER — Other Ambulatory Visit (INDEPENDENT_AMBULATORY_CARE_PROVIDER_SITE_OTHER): Payer: Self-pay | Admitting: Gastroenterology

## 2022-12-11 ENCOUNTER — Telehealth (INDEPENDENT_AMBULATORY_CARE_PROVIDER_SITE_OTHER): Payer: Self-pay | Admitting: *Deleted

## 2022-12-11 DIAGNOSIS — K5000 Crohn's disease of small intestine without complications: Secondary | ICD-10-CM | POA: Insufficient documentation

## 2022-12-11 DIAGNOSIS — K50018 Crohn's disease of small intestine with other complication: Secondary | ICD-10-CM

## 2022-12-11 NOTE — Telephone Encounter (Signed)
I spoke with [facility] nurse navigator with [employer]. Phone number [phone]. He told me his referral from [place] for crohn's included infusion services and I let him know we do not do infusions in our office and would it cover if we sent him to a different facility for infusion and he said it would as long as it was [facility] of [facility].

## 2022-12-11 NOTE — Telephone Encounter (Signed)
Patient seen yesterday and needs stelara  infusion started.  He reported that his [place] can get a medication for lower price.  Will try to reach his pharmacy to determine which infusion center the [place] uses. Patient left two numbers to try for [place] [phone] which I called and was told to call [phone] ext [phone] for community care which was the 2nd nubmer the patient gave [place]. I called and was told I needed to speak with Nada Boozer who is the GI nurse navigator.

## 2022-12-11 NOTE — Telephone Encounter (Signed)
Yes at [employer]. Let me know if you want me to place it

## 2022-12-11 NOTE — Telephone Encounter (Signed)
Thanks for working on this [employer] we have him set up for [facility] then? I do need to place the order as the infusion center as usual right? Thanks

## 2022-12-11 NOTE — Telephone Encounter (Signed)
Order is in, please reach Maudie Mercury to obtain authorization for the IV dose and the SQ dosages for a year (SQ every 8 weeks, 90 mcg). Please let the patient know this will be done at [facility] Thanks

## 2022-12-14 ENCOUNTER — Other Ambulatory Visit: Payer: Self-pay

## 2022-12-14 NOTE — Telephone Encounter (Signed)
[facility] ( [facility] told [facility] since his [facility] was for crohn's and included infusion services) patient would like to go forward with setting up at [facility].

## 2022-12-15 NOTE — Telephone Encounter (Signed)
Will hold message to do auth for injections after patient receives first infusion.

## 2022-12-16 ENCOUNTER — Telehealth: Payer: Self-pay | Admitting: Pharmacy Technician

## 2022-12-16 NOTE — Telephone Encounter (Signed)
Dr. Jenetta Downer,  Community of care: Approved  Auth Submission: APPROVED @AP$  Payer: [facility] Medication & CPT/J Code(s) submitted: Stelara Infusion (Ustekinumab) J3358 Route of submission (phone, fax, portal):  Phone # Fax # Auth type: Buy/Bill Units/visits requested: X3 Reference number: [id number] Approval from: 12/16/22 to 05/11/23

## 2022-12-17 ENCOUNTER — Other Ambulatory Visit: Payer: Self-pay

## 2022-12-22 ENCOUNTER — Encounter (HOSPITAL_COMMUNITY)
Admission: RE | Admit: 2022-12-22 | Discharge: 2022-12-22 | Disposition: A | Discharge status: Home / Self Care | Payer: No Typology Code available for payment source | Source: Ambulatory Visit | Attending: Gastroenterology | Admitting: Gastroenterology

## 2022-12-22 ENCOUNTER — Telehealth: Payer: Self-pay

## 2022-12-22 ENCOUNTER — Encounter (HOSPITAL_COMMUNITY): Payer: Self-pay | Admitting: Gastroenterology

## 2022-12-22 VITALS — BP 135/71 | HR 70 | Temp 97.5°F | Resp 20 | Ht 76.0 in | Wt 288.0 lb

## 2022-12-22 DIAGNOSIS — K50018 Crohn's disease of small intestine with other complication: Secondary | ICD-10-CM | POA: Diagnosis not present

## 2022-12-22 MED ORDER — USTEKINUMAB 130 MG/26ML IV SOLN
520.0000 mg | Freq: Once | INTRAVENOUS | Status: AC
  Administered 2022-12-22: 520 mg via INTRAVENOUS
  Filled 2022-12-22: qty 104

## 2022-12-22 NOTE — Telephone Encounter (Signed)
Sorry meant to say mesalamine, please let him know to stop mesalamine as he already had his Stelara dose Thanks

## 2022-12-22 NOTE — Telephone Encounter (Signed)
Thanks will sign them tomorrow

## 2022-12-22 NOTE — Telephone Encounter (Signed)
Patient called back and says he did not think he was on Tizanidine,but he is on trazodone that was prescribed by Dr.Sasser for sleep QHS is this what you meant?

## 2022-12-22 NOTE — Telephone Encounter (Signed)
Patient had stelara infusion today. Forms for [facility] for stelara 54m every 8 weeks injections on your desk to sign.

## 2022-12-22 NOTE — Telephone Encounter (Signed)
Patient made aware It was the mesalamine only to discontinue today with his IV infusion.

## 2022-12-22 NOTE — Progress Notes (Signed)
Diagnosis: Crohn's Disease  Provider:  Maylon Peppers MD  Procedure: Infusion  IV Type: Peripheral, IV Location: R Antecubital  Stelera (Ustekinumab), Dose: 520 mg  Infusion Start Time: [id number]  Infusion Stop Time: 1228  Post Infusion IV Care: Observation period completed and Peripheral IV Discontinued  Discharge: Condition: Good, Destination: Home . AVS Provided  Performed by:  Baxter Hire, RN

## 2022-12-22 NOTE — Telephone Encounter (Signed)
Patient getting infusion today at 11 am. Will start auth for injections after infusion is done.

## 2022-12-22 NOTE — Telephone Encounter (Signed)
Patient called today states he had first infusion of Stelara he was asking what medications he needs to stop. I advised what was noted in the last office visit note from 12/10/2022, which was stop tizanidine, and Mesalamine since he had his infusion. Please advise if there is anything else he needs to stop. Thanks,  For now, he can continue taking tizanidine until the day he receives his initial dose of Stelara .   -Will start authorization process for Stelara induction 520 mg and maintenance 90 mg every 8 weeks  -Stop mesalamine once he receives the first dose of Stelara - RTC 3 months

## 2022-12-25 ENCOUNTER — Other Ambulatory Visit (INDEPENDENT_AMBULATORY_CARE_PROVIDER_SITE_OTHER): Payer: Self-pay | Admitting: *Deleted

## 2022-12-25 MED ORDER — USTEKINUMAB 90 MG/ML ~~LOC~~ SOSY: PREFILLED_SYRINGE | SUBCUTANEOUS | 5 refills | Status: DC

## 2022-12-25 NOTE — Telephone Encounter (Signed)
Rx sent to [facility]

## 2022-12-29 ENCOUNTER — Telehealth (INDEPENDENT_AMBULATORY_CARE_PROVIDER_SITE_OTHER): Payer: Self-pay | Admitting: *Deleted

## 2022-12-29 NOTE — Telephone Encounter (Signed)
Patient called and states he received a letter from [place] stating they denied his claim for infusion. I called patient back and told him I see a note in his chart from 2/14 stating it was approved. I gave him the phone number to [facility] [phone] since that is where the authorization was done at to call to clarify.

## 2023-01-01 NOTE — Telephone Encounter (Signed)
Called and left message with Sherren Mocha at [facility] to get update on status since I sent in on 2/23 and have not heard back.

## 2023-01-04 ENCOUNTER — Telehealth (INDEPENDENT_AMBULATORY_CARE_PROVIDER_SITE_OTHER): Payer: Self-pay

## 2023-01-04 NOTE — Telephone Encounter (Signed)
Patient called today states he received a bill from [facility] for his Stelara. He says this should not have been filed under [facility] but should have been filed under [place]. I advised the patient to call the [facility] as it looks like it was ran through the [place], and if not he can ask them to re - file it under the [place]. Patient states understanding.

## 2023-01-04 NOTE — Telephone Encounter (Signed)
Thanks for the update

## 2023-01-05 NOTE — Telephone Encounter (Signed)
Talked with leslie and she states he is ready to schedule shipment and they would be reaching out to him soon to discuss co pay and going over options with co pay cards. I let patient know this and he wanted med through the [place]. I called [facility] in [place] [phone]. Press 1 for pharmacy. I spoke with Herbie Baltimore at pharmacy and he was not sure pharmacy could get stelara '90mg'$  injections or not and he was going to send pharmacist a message and told me I should receive a call back in 24 hours. Patient is aware of all and verbalized understanding.

## 2023-01-05 NOTE — Telephone Encounter (Signed)
Called and left message with Magda Paganini at [facility] phone number [phone] ext [phone]

## 2023-01-06 ENCOUNTER — Other Ambulatory Visit: Payer: Self-pay | Admitting: *Deleted

## 2023-01-06 MED ORDER — USTEKINUMAB 90 MG/ML ~~LOC~~ SOSY: PREFILLED_SYRINGE | SUBCUTANEOUS | 5 refills | Status: DC

## 2023-01-06 NOTE — Telephone Encounter (Signed)
[facility] in [place] back and spoke with [place]. She told me [facility] would not be able to [facility] stelara for the patient. I asked would they cover co pay if he got from another pharmacy and was told no they would not.

## 2023-01-07 NOTE — Telephone Encounter (Signed)
Patient aware not able to get stelara through [place] and he has not heard back from [facility]. I will call [facility] back to get status. Last call to them they told me they would be reaching out to patient to discuss cost. Patient aware if co payment is too high we will try patient assistance.

## 2023-01-08 NOTE — Telephone Encounter (Signed)
I called [facility] and left message with Sherren Mocha that patient still has not received a call from [facility] to go over cost and asked that Sherren Mocha call me back or either call the patient.

## 2023-01-11 NOTE — Telephone Encounter (Signed)
Todd called back and left a voicemail and also an email to me that states they have tried to reach patient but no answer and co pay is [phone] for stelara injections and he should go through patient assistance. I called patient and he is coming today to fill out patient assistance forms.

## 2023-01-11 NOTE — Telephone Encounter (Signed)
Patient picked up forms and will bring back once he fills out with his financial information.

## 2023-01-19 NOTE — Telephone Encounter (Signed)
I called and left a message on vm asked that if he is still interested in receiving the free med through patient assistance he needs to bring in his paper work.

## 2023-01-19 NOTE — Telephone Encounter (Signed)
Patient called back and states he has paperwork from [place] stating [facility] will pay for med. States he will drop off at office.   [phone]

## 2023-01-20 NOTE — Telephone Encounter (Signed)
Patient brought the said paper work from the [place] for our review. The forms he brought by were an explanation of benefits that the [place] had covered for the IV portion of therapy from 12/22/2022 at [facility]. I advised that this was not stating they would pay for his SQ doses, and Abigail Butts had already spoke with the [place] and they were not going to cover the SQ doses for him. I advised that he needed to finish filling out the forms I had given him for Patient assistance with Stelara and to bring those back.

## 2023-01-20 NOTE — Telephone Encounter (Signed)
Patient brought in information and [facility] faxed it.

## 2023-01-21 NOTE — Telephone Encounter (Signed)
Patient made aware he is approved for free Stelara starting 01/21/2023- 11/02/2023. He is aware to call [facility] to set up shipment and schedule a nurse to come to his home to show him how to administer the shot. He is aware he is due around February 16, 2023 for his next shot, and is aware to let them know this at the time he calls [facility] to set up shipment.

## 2023-02-04 NOTE — Telephone Encounter (Signed)
Patient told me today he is suppose to get stelara injections on the 12th and will inject on the 16th.

## 2023-02-15 NOTE — Telephone Encounter (Signed)
Called patient and he told me he did receive the stelara injection on the 12th and he will inject on the 16th. He told me someone from [facility] would be coming to show him how to inject. Advised patient to call office if he does not receive instruction and we can show him with demo pen in office.

## 2023-03-08 ENCOUNTER — Encounter (HOSPITAL_COMMUNITY): Payer: Self-pay | Admitting: Gastroenterology

## 2023-03-08 ENCOUNTER — Ambulatory Visit (INDEPENDENT_AMBULATORY_CARE_PROVIDER_SITE_OTHER): Payer: No Typology Code available for payment source | Admitting: Gastroenterology

## 2023-03-08 ENCOUNTER — Encounter (INDEPENDENT_AMBULATORY_CARE_PROVIDER_SITE_OTHER): Payer: Self-pay | Admitting: Gastroenterology

## 2023-03-08 ENCOUNTER — Telehealth (INDEPENDENT_AMBULATORY_CARE_PROVIDER_SITE_OTHER): Payer: Self-pay

## 2023-03-08 VITALS — BP 127/71 | HR 72 | Temp 97.5°F | Ht 76.0 in | Wt 290.0 lb

## 2023-03-08 DIAGNOSIS — Z125 Encounter for screening for malignant neoplasm of prostate: Secondary | ICD-10-CM | POA: Diagnosis not present

## 2023-03-08 DIAGNOSIS — Z1321 Encounter for screening for nutritional disorder: Secondary | ICD-10-CM

## 2023-03-08 DIAGNOSIS — K50018 Crohn's disease of small intestine with other complication: Secondary | ICD-10-CM | POA: Diagnosis not present

## 2023-03-08 NOTE — Patient Instructions (Addendum)
Continue Stelara every 8 weeks. Perform blood workup If symptoms are controlled or better in follow up, will proceed with colonoscopy

## 2023-03-08 NOTE — Telephone Encounter (Signed)
Per patient PSA was not on his lab requisution. Tried to add at local lab was told we will have to call customer service tomorrow when the lab has the blood in hand.

## 2023-03-08 NOTE — Progress Notes (Signed)
Katrinka Blazing, M.D. Gastroenterology & Hepatology [facility] Gastroenterology [address] [place], [place] [address]  Primary Care Physician: Estanislado Pandy, MD [address] [place] [address]  I will communicate my assessment and recommendations to the referring MD via EMR.  Problems: moderate Crohn's ileitis   History of Present Illness: Bruce Stewart is a 78 y.o. male with PMH moderate Crohn's ileitis, coronary artery disease, history of colon cancer, urolithiasis, who presents for follow up of Crohn's disease.   The patient was last seen on 12/10/2022. At that time, the patient was started on Stelara.  He has received so far the induction dose on 1 maintenance dose (received this 3 weeks ago).  Patient reports feeling better compared to prior. States he still has some pain in the RLQ when he is about to have a bowel movement, which lasts for a few minutes. He has had significant flatulence recently but no  bloating. States stools are more formed, but very occasionally he will have some watery Bms that respond to Imodium use. The patient denies having any nausea, vomiting, fever, chills, hematochezia, melena, hematemesis, abdominal distention, jaundice, pruritus or weight loss. Has to stay away from red meat as this may trigger his diarrhea.  Last flu shot: 2023 Last pneumonia shot:had it in the past but does not remember when Last zoster vaccine: had it in the past but does not remember when COVID-19 shot: Moderna  Last EGD: 06/12/2020, normal esophagus, erythema granularity in the antrum (moderate gastropathy with negative H. pylori staining), with presence of 2 polyps in the gastric fundus (biopsies showed fundic gland polyps), normal duodenal bulb.  Last Colonoscopy: 12/04/2022 which showed presence of inflammation, aphthous ulcers, edema and erythema in the terminal ileum which was moderate in severity consistent with active small bowel  Crohn's disease (biopsies consistent with this), there was diverticulosis in the sigmoid and descending colon, as well as internal hemorrhoids.   Past Medical History: Past Medical History:  Diagnosis Date   Anal fissure    Anemia    Arthritis    Complication of anesthesia    Coronary artery disease    Diverticulitis    Family hx of colon cancer    History of kidney stones    Kidney stone    Myocardial infarction (HCC) 10/2019   PONV (postoperative nausea and vomiting)     Past Surgical History: Past Surgical History:  Procedure Laterality Date   back surgery x 6 Other]     BIOPSY  02/11/2018   Procedure: BIOPSY;  Surgeon: Malissa Hippo, MD;  [facility];  Service: Endoscopy;;  ileal   BIOPSY  06/12/2020   Procedure: BIOPSY;  Surgeon: Malissa Hippo, MD;  [facility];  Service: Endoscopy;;  antral, ileal   BIOPSY  12/04/2022   Procedure: BIOPSY;  Surgeon: Dolores Frame, MD;  [facility];  Service: Gastroenterology;;   CARDIAC CATHETERIZATION  10/2019   CIRCUMCISION     COLONOSCOPY N/A 09/12/2015   Procedure: COLONOSCOPY;  Surgeon: Malissa Hippo, MD;  [facility];  Service: Endoscopy;  Laterality: N/A;  2:25-moved up to 930 Ann notified pt   COLONOSCOPY N/A 02/11/2018   Procedure: COLONOSCOPY;  Surgeon: Malissa Hippo, MD;  [facility];  Service: Endoscopy;  Laterality: N/A;  9:15-moved to 1015 Ann to notify pt   COLONOSCOPY WITH PROPOFOL N/A 06/12/2020   Procedure: COLONOSCOPY WITH PROPOFOL;  Surgeon: Malissa Hippo, MD;  [facility];  Service: Endoscopy;  Laterality: N/A;  830   COLONOSCOPY WITH PROPOFOL N/A 12/04/2022   Procedure: COLONOSCOPY WITH PROPOFOL;  Surgeon: Dolores Frame, MD;  [facility];  Service: Gastroenterology;  Laterality: N/A;  1230pm, asa 3   CORONARY ANGIOPLASTY WITH STENT PLACEMENT  10/2019   ESOPHAGOGASTRODUODENOSCOPY (EGD) WITH PROPOFOL N/A  06/12/2020   Procedure: ESOPHAGOGASTRODUODENOSCOPY (EGD) WITH PROPOFOL;  Surgeon: Malissa Hippo, MD;  [facility];  Service: Endoscopy;  Laterality: N/A;   POLYPECTOMY  06/12/2020   Procedure: POLYPECTOMY;  Surgeon: Malissa Hippo, MD;  [facility];  Service: Endoscopy;;  gatric   repair of anal fissure     spincterotomy   SPINAL CORD STIMULATOR IMPLANT     SPINE SURGERY N/A 03/31/2021   spine stimulator   TOTAL HIP ARTHROPLASTY Left 06/26/2020   Procedure: TOTAL HIP ARTHROPLASTY ANTERIOR APPROACH;  Surgeon: Ollen Gross, MD;  [facility];  Service: Orthopedics;  Laterality: Left;     Family History: Family History  Problem Relation Age of Onset   Diabetes Mother    Colon cancer Father     Social History: Social History   Tobacco Use  Smoking Status Never  Smokeless Tobacco Never   Social History   Substance and Sexual Activity  Alcohol Use No   Alcohol/week: 0.0 standard drinks of alcohol   Social History   Substance and Sexual Activity  Drug Use No    Allergies: Allergies  Allergen Reactions   Codeine Itching   Orange Fruit [Citrus] Itching   Bee Venom Rash    Medications: Current Outpatient Medications  Medication Sig Dispense Refill   acetaminophen (TYLENOL) 500 MG tablet Take 1,000 mg by mouth every 8 (eight) hours as needed for mild pain or moderate pain.     aspirin EC 81 MG tablet Take 81 mg by mouth daily. Swallow whole.     Calcium Carbonate Antacid (TUMS PO) Take 1 tablet by mouth daily as needed (heartburn).     Calcium-Magnesium-Zinc (CAL-MAG-ZINC PO) Take 1 tablet by mouth at bedtime.     carboxymethylcellulose (REFRESH PLUS) 0.5 % SOLN Place 1 drop into both eyes 3 (three) times daily as needed (dry eyes).     carvedilol (COREG) 6.25 MG tablet Take 6.25 mg by mouth 2 (two) times daily with a meal.      Cholecalciferol 5000 units TABS Take 5,000 Units by mouth daily.      cyanocobalamin 1000 MCG tablet  Take 1,000 mcg by mouth daily.     diclofenac Sodium (VOLTAREN) 1 % GEL Apply 1 application topically daily as needed (pain).      DULoxetine (CYMBALTA) 30 MG capsule Take 30 mg by mouth daily.     Fish Oil-Cholecalciferol (FISH OIL + D3 PO) Take 1,400 mg by mouth daily.      Melatonin 10 MG TABS Take 10 mg by mouth at bedtime.     Multiple Vitamin (MULTIVITAMIN WITH MINERALS) TABS tablet Take 1 tablet by mouth daily.     OVER THE COUNTER MEDICATION Take 1 tablet by mouth daily. Keto plus ACV Gummie     pantoprazole (PROTONIX) 40 MG tablet TAKE 1 TABLET BY MOUTH DAILY 100 tablet 2   pregabalin (LYRICA) 100 MG capsule Take 100 mg by mouth 3 (three) times daily.     rosuvastatin (CRESTOR) 20 MG tablet Take 20 mg by mouth daily.     tamsulosin (FLOMAX)  0.4 MG CAPS capsule Take 0.8 mg by mouth at bedtime.     traZODone (DESYREL) 50 MG tablet Take 50 mg by mouth at bedtime.     Turmeric (QC TUMERIC COMPLEX PO) Take 1 capsule by mouth daily.     ustekinumab (STELARA) 90 MG/ML SOSY injection Inject 90 mg every 8 weeks as directed 1 mL 5   No current facility-administered medications for this visit.    Review of Systems: GENERAL: negative for malaise, night sweats HEENT: No changes in hearing or vision, no nose bleeds or other nasal problems. NECK: Negative for lumps, goiter, pain and significant neck swelling RESPIRATORY: Negative for cough, wheezing CARDIOVASCULAR: Negative for chest pain, leg swelling, palpitations, orthopnea GI: SEE HPI MUSCULOSKELETAL: Negative for joint pain or swelling, back pain, and muscle pain. SKIN: Negative for lesions, rash PSYCH: Negative for sleep disturbance, mood disorder and recent psychosocial stressors. HEMATOLOGY Negative for prolonged bleeding, bruising easily, and swollen nodes. ENDOCRINE: Negative for cold or heat intolerance, polyuria, polydipsia and goiter. NEURO: negative for tremor, gait imbalance, syncope and seizures. The remainder of the review  of systems is noncontributory.   Physical Exam: BP 127/71 (BP Location: Left Arm, Patient Position: Sitting, Cuff Size: Large)   Pulse 72   Temp (!) 97.5 F (36.4 C) (Temporal)   Ht 6\' 4"  (1.93 m)   Wt 290 lb (131.5 kg)   BMI 35.30 kg/m  GENERAL: The patient is AO x3, in no acute distress. Obese.  HEENT: Head is normocephalic and atraumatic. EOMI are intact. Mouth is well hydrated and without lesions. NECK: Supple. No masses LUNGS: Clear to auscultation. No presence of rhonchi/wheezing/rales. Adequate chest expansion HEART: RRR, normal s1 and s2. ABDOMEN: Soft, nontender, no guarding, no peritoneal signs, and nondistended. BS +. No masses. EXTREMITIES: Without any cyanosis, clubbing, rash, lesions or edema. NEUROLOGIC: AOx3, no focal motor deficit. SKIN: no jaundice, no rashes  Imaging/Labs: as above  I personally reviewed and interpreted the available labs, imaging and endoscopic files.  Impression and Plan: Bruce Stewart is a 77 y.o. male with PMH moderate Crohn's ileitis, coronary artery disease, history of colon cancer, urolithiasis, who presents for follow up of Crohn's disease.  Patient has presented improvement of his symptoms while taking Stelara induction dose and first maintenance dose.  Still presenting some symptoms but he reports significant improvement with this regimen.  We discussed that it would be important to have endoscopic surveillance in the future once his symptoms improve substantially, possibly 6-9 months after starting him on Stelara.  Patient understood and agreed.  For now we will obtain surveillance labs including CBC, CMP, CRP, vitamin D and iron studies.  He is up-to-date with his preventative vaccinations.  -Continue Stelara every 8 weeks. - CBC, CMP, CRP, vitamin D and iron studies - If symptoms are controlled or better in follow up, will proceed with colonoscopy  All questions were answered.      Katrinka Blazing, MD Gastroenterology and  Hepatology [facility] Gastroenterology

## 2023-03-09 DIAGNOSIS — M533 Sacrococcygeal disorders, not elsewhere classified: Secondary | ICD-10-CM | POA: Diagnosis not present

## 2023-03-09 NOTE — Telephone Encounter (Signed)
I spoke with Tosha with the main [facility] she will pull the specimen and have a Psa added to it with the Diagnosis of Z12.5. If any issues adding they will notify the office.

## 2023-03-10 LAB — CBC WITH DIFFERENTIAL/PLATELET
Basophils Absolute: 0 10*3/uL (ref 0.0–0.2)
Basos: 0 %
EOS (ABSOLUTE): 0.1 10*3/uL (ref 0.0–0.4)
Eos: 2 %
Hematocrit: 41.3 % (ref 37.5–51.0)
Hemoglobin: 13.4 g/dL (ref 13.0–17.7)
Immature Grans (Abs): 0 10*3/uL (ref 0.0–0.1)
Immature Granulocytes: 0 %
Lymphocytes Absolute: 2 10*3/uL (ref 0.7–3.1)
Lymphs: 35 %
MCH: 30.5 pg (ref 26.6–33.0)
MCHC: 32.4 g/dL (ref 31.5–35.7)
MCV: 94 fL (ref 79–97)
Monocytes Absolute: 0.4 10*3/uL (ref 0.1–0.9)
Monocytes: 7 %
Neutrophils Absolute: 3.2 10*3/uL (ref 1.4–7.0)
Neutrophils: 56 %
Platelets: 184 10*3/uL (ref 150–450)
RBC: 4.4 x10E6/uL (ref 4.14–5.80)
RDW: 13.3 % (ref 11.6–15.4)
WBC: 5.8 10*3/uL (ref 3.4–10.8)

## 2023-03-10 LAB — COMPREHENSIVE METABOLIC PANEL
ALT: 14 IU/L (ref 0–44)
AST: 17 IU/L (ref 0–40)
Albumin/Globulin Ratio: 2.9 — ABNORMAL HIGH (ref 1.2–2.2)
Albumin: 4.3 g/dL (ref 3.8–4.8)
Alkaline Phosphatase: 70 IU/L (ref 44–121)
BUN/Creatinine Ratio: 13 (ref 10–24)
BUN: 21 mg/dL (ref 8–27)
Bilirubin Total: 0.4 mg/dL (ref 0.0–1.2)
CO2: 28 mmol/L (ref 20–29)
Calcium: 9.1 mg/dL (ref 8.6–10.2)
Chloride: 100 mmol/L (ref 96–106)
Creatinine, Ser: 1.66 mg/dL — ABNORMAL HIGH (ref 0.76–1.27)
Globulin, Total: 1.5 g/dL (ref 1.5–4.5)
Glucose: 95 mg/dL (ref 70–99)
Potassium: 5.5 mmol/L — ABNORMAL HIGH (ref 3.5–5.2)
Sodium: 143 mmol/L (ref 134–144)
Total Protein: 5.8 g/dL — ABNORMAL LOW (ref 6.0–8.5)
eGFR: 42 mL/min/{1.73_m2} — ABNORMAL LOW (ref >59)

## 2023-03-10 LAB — IRON,TIBC AND FERRITIN PANEL
Ferritin: 62 ng/mL (ref 30–400)
Iron Saturation: 17 % (ref 15–55)
Iron: 60 ug/dL (ref 38–169)
Total Iron Binding Capacity: 358 ug/dL (ref 250–450)
UIBC: 298 ug/dL (ref 111–343)

## 2023-03-10 LAB — C-REACTIVE PROTEIN: CRP: 1 mg/L (ref 0–10)

## 2023-03-10 LAB — VITAMIN D 25 HYDROXY (VIT D DEFICIENCY, FRACTURES): Vit D, 25-Hydroxy: 61.6 ng/mL (ref 30.0–100.0)

## 2023-03-11 LAB — PSA: Prostate Specific Ag, Serum: 0.8 ng/mL (ref 0.0–4.0)

## 2023-03-11 LAB — SPECIMEN STATUS REPORT

## 2023-03-20 ENCOUNTER — Other Ambulatory Visit (INDEPENDENT_AMBULATORY_CARE_PROVIDER_SITE_OTHER): Payer: Self-pay | Admitting: Gastroenterology

## 2023-03-20 DIAGNOSIS — K633 Ulcer of intestine: Secondary | ICD-10-CM

## 2023-04-05 DIAGNOSIS — B078 Other viral warts: Secondary | ICD-10-CM | POA: Diagnosis not present

## 2023-04-05 DIAGNOSIS — X32XXXA Exposure to sunlight, initial encounter: Secondary | ICD-10-CM | POA: Diagnosis not present

## 2023-04-05 DIAGNOSIS — L720 Epidermal cyst: Secondary | ICD-10-CM | POA: Diagnosis not present

## 2023-04-05 DIAGNOSIS — D225 Melanocytic nevi of trunk: Secondary | ICD-10-CM | POA: Diagnosis not present

## 2023-04-05 DIAGNOSIS — L570 Actinic keratosis: Secondary | ICD-10-CM | POA: Diagnosis not present

## 2023-04-28 ENCOUNTER — Telehealth (INDEPENDENT_AMBULATORY_CARE_PROVIDER_SITE_OTHER): Payer: Self-pay

## 2023-04-28 NOTE — Telephone Encounter (Signed)
I made the patient aware the psa was with in normal limits.     Component Ref Range & Units 1 mo ago  Prostate Specific Ag, Serum 0.0 - 4.0 ng/mL 0.8  Comment: Roche ECLIA methodology. According to the American Urological Association, Serum PSA should decrease and remain at undetectable levels after radical prostatectomy. The AUA defines biochemical recurrence as an initial PSA value 0.2 ng/mL or greater followed by a subsequent confirmatory PSA value 0.2 ng/mL or greater. Values obtained with different assay methods or kits cannot be used interchangeably. Results cannot be interpreted as absolute evidence of the presence or absence of malignant disease.  Resulting Agency [id number]         Narrative Performed by: Verdell Carmine Performed at:  [address], [place], [place]  [id number] Lab Director: Jolene Schimke MD, Phone:  [phone]    Specimen Collected: 03/08/23 13:49 Last Resulted: 03/11/23 07:36      Lab Flowsheet      Order Details      View Encounter      Lab and Collection Details      Routing      Result History    View All Conversations on this Encounter        Result Care Coordination   Result Notes   Dolores Frame, MD 03/11/2023  9:14 AM EDT Back to Top    PSA normal

## 2023-05-19 ENCOUNTER — Other Ambulatory Visit: Payer: Self-pay

## 2023-05-24 DIAGNOSIS — K5730 Diverticulosis of large intestine without perforation or abscess without bleeding: Secondary | ICD-10-CM | POA: Diagnosis not present

## 2023-05-24 DIAGNOSIS — Z885 Allergy status to narcotic agent status: Secondary | ICD-10-CM | POA: Diagnosis not present

## 2023-05-24 DIAGNOSIS — E785 Hyperlipidemia, unspecified: Secondary | ICD-10-CM | POA: Diagnosis not present

## 2023-05-24 DIAGNOSIS — Z91018 Allergy to other foods: Secondary | ICD-10-CM | POA: Diagnosis not present

## 2023-05-24 DIAGNOSIS — I2510 Atherosclerotic heart disease of native coronary artery without angina pectoris: Secondary | ICD-10-CM | POA: Diagnosis not present

## 2023-05-24 DIAGNOSIS — R1031 Right lower quadrant pain: Secondary | ICD-10-CM | POA: Diagnosis not present

## 2023-05-24 DIAGNOSIS — R109 Unspecified abdominal pain: Secondary | ICD-10-CM | POA: Diagnosis not present

## 2023-05-24 DIAGNOSIS — K5090 Crohn's disease, unspecified, without complications: Secondary | ICD-10-CM | POA: Diagnosis not present

## 2023-05-24 DIAGNOSIS — M1990 Unspecified osteoarthritis, unspecified site: Secondary | ICD-10-CM | POA: Diagnosis not present

## 2023-05-24 DIAGNOSIS — Z79899 Other long term (current) drug therapy: Secondary | ICD-10-CM | POA: Diagnosis not present

## 2023-05-24 DIAGNOSIS — Z886 Allergy status to analgesic agent status: Secondary | ICD-10-CM | POA: Diagnosis not present

## 2023-05-24 DIAGNOSIS — Z91030 Bee allergy status: Secondary | ICD-10-CM | POA: Diagnosis not present

## 2023-05-24 DIAGNOSIS — Z87442 Personal history of urinary calculi: Secondary | ICD-10-CM | POA: Diagnosis not present

## 2023-05-24 DIAGNOSIS — Z7982 Long term (current) use of aspirin: Secondary | ICD-10-CM | POA: Diagnosis not present

## 2023-05-24 DIAGNOSIS — N281 Cyst of kidney, acquired: Secondary | ICD-10-CM | POA: Diagnosis not present

## 2023-05-24 DIAGNOSIS — K219 Gastro-esophageal reflux disease without esophagitis: Secondary | ICD-10-CM | POA: Diagnosis not present

## 2023-05-24 DIAGNOSIS — I10 Essential (primary) hypertension: Secondary | ICD-10-CM | POA: Diagnosis not present

## 2023-05-24 DIAGNOSIS — I252 Old myocardial infarction: Secondary | ICD-10-CM | POA: Diagnosis not present

## 2023-05-28 DIAGNOSIS — R1031 Right lower quadrant pain: Secondary | ICD-10-CM | POA: Diagnosis not present

## 2023-06-17 ENCOUNTER — Encounter (INDEPENDENT_AMBULATORY_CARE_PROVIDER_SITE_OTHER): Payer: Self-pay | Admitting: Gastroenterology

## 2023-07-12 ENCOUNTER — Ambulatory Visit (INDEPENDENT_AMBULATORY_CARE_PROVIDER_SITE_OTHER): Payer: No Typology Code available for payment source | Admitting: Gastroenterology

## 2023-07-21 DIAGNOSIS — M79641 Pain in right hand: Secondary | ICD-10-CM | POA: Diagnosis not present

## 2023-07-21 DIAGNOSIS — M79642 Pain in left hand: Secondary | ICD-10-CM | POA: Diagnosis not present

## 2023-07-21 DIAGNOSIS — G5602 Carpal tunnel syndrome, left upper limb: Secondary | ICD-10-CM | POA: Diagnosis not present

## 2023-08-02 ENCOUNTER — Encounter (INDEPENDENT_AMBULATORY_CARE_PROVIDER_SITE_OTHER): Payer: Self-pay | Admitting: Gastroenterology

## 2023-08-02 ENCOUNTER — Ambulatory Visit (INDEPENDENT_AMBULATORY_CARE_PROVIDER_SITE_OTHER): Payer: Medicare Other | Admitting: Gastroenterology

## 2023-08-02 VITALS — BP 140/83 | HR 70 | Temp 98.1°F | Ht 76.0 in | Wt 294.5 lb

## 2023-08-02 DIAGNOSIS — Z7189 Other specified counseling: Secondary | ICD-10-CM

## 2023-08-02 DIAGNOSIS — K50018 Crohn's disease of small intestine with other complication: Secondary | ICD-10-CM | POA: Diagnosis not present

## 2023-08-02 DIAGNOSIS — Z125 Encounter for screening for malignant neoplasm of prostate: Secondary | ICD-10-CM

## 2023-08-02 NOTE — Patient Instructions (Addendum)
Continue Stelara every 8 weeks Okay to get the flu and shingles vaccine Take Imodium only if having more than 3 bowel movements per day or if having urgency to have bowel movements and not close to the restroom Perform blood workup THE DAY BEFORE YOUR NEXT STELARA DOSE

## 2023-08-02 NOTE — Progress Notes (Signed)
Bruce Stewart, M.D. Gastroenterology & Hepatology [facility] Gastroenterology [address] [place], [place] [address] Primary Care Physician: Bruce Pandy, MD [address] [place] [address]  Problems: Moderate Crohn's ileitis  History of Present Illness: Bruce Stewart is a 77 y.o. male with PMH moderate Crohn's ileitis, coronary artery disease, history of colon cancer, urolithiasis, who presents for follow up of Crohn's disease.   The patient was last seen on 03/08/2023.  He was continued on Stelara every 8 weeks at that time.  He had CBC, CMP, CRP and vitamin D checked which were within normal limits.  Patient reports that he has felt he has presented some improvement of his symptoms when taking Stelara as his abdominal pain is milder than before, but is still present (specially when hving a BM). States his bowel movements are the same as before, as he is having a BM 2-5 times of watery Bms. He uses Imodium when he has diarrhea but it leads to constipation. Also reports bloating episodes intermittently.  The patient denies having any nausea, vomiting, fever, chills, hematochezia, melena, hematemesis, jaundice, pruritus.  Has gained some weight recently and is concerned this could be related to Stelara.  Last flu shot: 2023 Last pneumonia shot:had it in the past but does not remember when Last zoster vaccine: had it in the past but does not remember when COVID-19 shot: Moderna   Last EGD: 06/12/2020, normal esophagus, erythema granularity in the antrum (moderate gastropathy with negative H. pylori staining), with presence of 2 polyps in the gastric fundus (biopsies showed fundic gland polyps), normal duodenal bulb.   Last Colonoscopy:12/04/2022 presence of inflammation, aphthous ulcers, edema and erythema in the terminal ileum which was moderate in severity consistent with active small bowel Crohn's disease (biopsies consistent with this), there  was diverticulosis in the sigmoid and descending colon, as well as internal hemorrhoids.    Past Medical History: Past Medical History:  Diagnosis Date   Anal fissure    Anemia    Arthritis    Complication of anesthesia    Coronary artery disease    Diverticulitis    Family hx of colon cancer    History of kidney stones    Kidney stone    Myocardial infarction (HCC) 10/2019   PONV (postoperative nausea and vomiting)     Past Surgical History: Past Surgical History:  Procedure Laterality Date   back surgery x 6 Other]     BIOPSY  02/11/2018   Procedure: BIOPSY;  Surgeon: Malissa Hippo, MD;  [facility];  Service: Endoscopy;;  ileal   BIOPSY  06/12/2020   Procedure: BIOPSY;  Surgeon: Malissa Hippo, MD;  [facility];  Service: Endoscopy;;  antral, ileal   BIOPSY  12/04/2022   Procedure: BIOPSY;  Surgeon: Dolores Frame, MD;  [facility];  Service: Gastroenterology;;   CARDIAC CATHETERIZATION  10/2019   CIRCUMCISION     COLONOSCOPY N/A 09/12/2015   Procedure: COLONOSCOPY;  Surgeon: Malissa Hippo, MD;  [facility];  Service: Endoscopy;  Laterality: N/A;  2:25-moved up to 930 Ann notified pt   COLONOSCOPY N/A 02/11/2018   Procedure: COLONOSCOPY;  Surgeon: Malissa Hippo, MD;  [facility];  Service: Endoscopy;  Laterality: N/A;  9:15-moved to 1015 Ann to notify pt   COLONOSCOPY WITH PROPOFOL N/A 06/12/2020   Procedure: COLONOSCOPY WITH PROPOFOL;  Surgeon: Malissa Hippo, MD;  [facility];  Service: Endoscopy;  Laterality: N/A;  830   COLONOSCOPY WITH PROPOFOL N/A 12/04/2022   Procedure: COLONOSCOPY WITH PROPOFOL;  Surgeon: Dolores Frame, MD;  [facility];  Service: Gastroenterology;  Laterality: N/A;  1230pm, asa 3   CORONARY ANGIOPLASTY WITH STENT PLACEMENT  10/2019   ESOPHAGOGASTRODUODENOSCOPY (EGD) WITH PROPOFOL N/A 06/12/2020   Procedure: ESOPHAGOGASTRODUODENOSCOPY  (EGD) WITH PROPOFOL;  Surgeon: Malissa Hippo, MD;  [facility];  Service: Endoscopy;  Laterality: N/A;   POLYPECTOMY  06/12/2020   Procedure: POLYPECTOMY;  Surgeon: Malissa Hippo, MD;  [facility];  Service: Endoscopy;;  gatric   repair of anal fissure     spincterotomy   SPINAL CORD STIMULATOR IMPLANT     SPINE SURGERY N/A 03/31/2021   spine stimulator   TOTAL HIP ARTHROPLASTY Left 06/26/2020   Procedure: TOTAL HIP ARTHROPLASTY ANTERIOR APPROACH;  Surgeon: Ollen Gross, MD;  [facility];  Service: Orthopedics;  Laterality: Left;     Family History: Family History  Problem Relation Age of Onset   Diabetes Mother    Colon cancer Father     Social History: Social History   Tobacco Use  Smoking Status Never  Smokeless Tobacco Never   Social History   Substance and Sexual Activity  Alcohol Use No   Alcohol/week: 0.0 standard drinks of alcohol   Social History   Substance and Sexual Activity  Drug Use No    Allergies: Allergies  Allergen Reactions   Codeine Itching   Orange Fruit [Citrus] Itching   Bee Venom Rash    Medications: Current Outpatient Medications  Medication Sig Dispense Refill   acetaminophen (TYLENOL) 500 MG tablet Take 1,000 mg by mouth every 8 (eight) hours as needed for mild pain or moderate pain.     aspirin EC 81 MG tablet Take 81 mg by mouth daily. Swallow whole.     Calcium Carbonate Antacid (TUMS PO) Take 1 tablet by mouth daily as needed (heartburn).     Calcium-Magnesium-Zinc (CAL-MAG-ZINC PO) Take 1 tablet by mouth at bedtime.     carboxymethylcellulose (REFRESH PLUS) 0.5 % SOLN Place 1 drop into both eyes 3 (three) times daily as needed (dry eyes).     carvedilol (COREG) 6.25 MG tablet Take 6.25 mg by mouth 2 (two) times daily with a meal.      Cholecalciferol 5000 units TABS Take 5,000 Units by mouth daily.      cyanocobalamin 1000 MCG tablet Take 1,000 mcg by mouth daily.     diclofenac Sodium  (VOLTAREN) 1 % GEL Apply 1 application topically daily as needed (pain).      DULoxetine (CYMBALTA) 30 MG capsule Take 30 mg by mouth daily.     Fish Oil-Cholecalciferol (FISH OIL + D3 PO) Take 1,400 mg by mouth daily.      Melatonin 10 MG TABS Take 10 mg by mouth at bedtime.     Multiple Vitamin (MULTIVITAMIN WITH MINERALS) TABS tablet Take 1 tablet by mouth daily.     OVER THE COUNTER MEDICATION Take 1 tablet by mouth daily. Keto plus ACV Gummie     pantoprazole (PROTONIX) 40 MG tablet TAKE 1 TABLET BY MOUTH DAILY 100 tablet 3   pregabalin (LYRICA) 100 MG capsule Take 100 mg by mouth 3 (three) times daily.     rosuvastatin (CRESTOR) 20 MG tablet Take 20 mg by mouth daily.     tamsulosin (FLOMAX) 0.4 MG CAPS capsule Take 0.8 mg  by mouth at bedtime.     traZODone (DESYREL) 50 MG tablet Take 50 mg by mouth at bedtime.     Turmeric (QC TUMERIC COMPLEX PO) Take 1 capsule by mouth daily.     ustekinumab (STELARA) 90 MG/ML SOSY injection Inject 90 mg every 8 weeks as directed 1 mL 5   No current facility-administered medications for this visit.    Review of Systems: GENERAL: negative for malaise, night sweats HEENT: No changes in hearing or vision, no nose bleeds or other nasal problems. NECK: Negative for lumps, goiter, pain and significant neck swelling RESPIRATORY: Negative for cough, wheezing CARDIOVASCULAR: Negative for chest pain, leg swelling, palpitations, orthopnea GI: SEE HPI MUSCULOSKELETAL: Negative for joint pain or swelling, back pain, and muscle pain. SKIN: Negative for lesions, rash PSYCH: Negative for sleep disturbance, mood disorder and recent psychosocial stressors. HEMATOLOGY Negative for prolonged bleeding, bruising easily, and swollen nodes. ENDOCRINE: Negative for cold or heat intolerance, polyuria, polydipsia and goiter. NEURO: negative for tremor, gait imbalance, syncope and seizures. The remainder of the review of systems is noncontributory.   Physical Exam: BP  (!) 166/76 (BP Location: Left Arm, Patient Position: Sitting, Cuff Size: Large)   Pulse 74   Temp 98.1 F (36.7 C) (Temporal)   Ht 6\' 4"  (1.93 m)   Wt 294 lb 8 oz (133.6 kg)   BMI 35.85 kg/m  GENERAL: The patient is AO x3, in no acute distress. Obese. HEENT: Head is normocephalic and atraumatic. EOMI are intact. Mouth is well hydrated and without lesions. NECK: Supple. No masses LUNGS: Clear to auscultation. No presence of rhonchi/wheezing/rales. Adequate chest expansion HEART: RRR, normal s1 and s2. ABDOMEN: tender to palpation in the RLQ, no guarding, no peritoneal signs, and nondistended. BS +. No masses. EXTREMITIES: Without any cyanosis, clubbing, rash, lesions or edema. NEUROLOGIC: AOx3, no focal motor deficit. SKIN: no jaundice, no rashes   Imaging/Labs: as above  I personally reviewed and interpreted the available labs, imaging and endoscopic files.  Impression and Plan: Gotham L Maclaughlin is a 77 y.o. male with PMH moderate Crohn's ileitis, coronary artery disease, history of colon cancer, urolithiasis, who presents for follow up of Crohn's disease.  The patient has presented some clinical improvement of his symptoms while on the Stelara but has not achieved yet clinical remission.  I discussed in detail that he will be important to determine if he has achieved adequate Stelara levels that will lead to both clinical and endoscopic remission.  We will repeat surveillance labs and check the Stelara levels before his next dose next week.  Patient understood and agreed.  He should continue with Stelara every 8 weeks for now.  Timing for repeat colonoscopy will depend on lab results.  For now he can keep taking Imodium as needed if presenting significant diarrhea.  Finally, in terms of his preventative measures, he should get flu and Shingrix vaccination.  - Continue Stelara every 8 weeks -Patient should proceed with flu and shingles vaccine -Take Imodium only if having more than  3 bowel movements per day or if having urgency to have bowel movements  -Check CBC, CMP, CRP, PSA and Stelara level THE DAY BEFORE NEXT STELARA DOSE  All questions were answered.      Bruce Blazing, MD Gastroenterology and Hepatology [facility] Gastroenterology

## 2023-08-10 DIAGNOSIS — K50018 Crohn's disease of small intestine with other complication: Secondary | ICD-10-CM | POA: Diagnosis not present

## 2023-08-25 LAB — CBC WITH DIFFERENTIAL/PLATELET
Basophils Absolute: 0 10*3/uL (ref 0.0–0.2)
Basos: 0 %
EOS (ABSOLUTE): 0.1 10*3/uL (ref 0.0–0.4)
Eos: 2 %
Hematocrit: 39.7 % (ref 37.5–51.0)
Hemoglobin: 13.2 g/dL (ref 13.0–17.7)
Immature Grans (Abs): 0 10*3/uL (ref 0.0–0.1)
Immature Granulocytes: 0 %
Lymphocytes Absolute: 2 10*3/uL (ref 0.7–3.1)
Lymphs: 37 %
MCH: 32.2 pg (ref 26.6–33.0)
MCHC: 33.2 g/dL (ref 31.5–35.7)
MCV: 97 fL (ref 79–97)
Monocytes Absolute: 0.5 10*3/uL (ref 0.1–0.9)
Monocytes: 9 %
Neutrophils Absolute: 2.8 10*3/uL (ref 1.4–7.0)
Neutrophils: 52 %
Platelets: 185 10*3/uL (ref 150–450)
RBC: 4.1 x10E6/uL — ABNORMAL LOW (ref 4.14–5.80)
RDW: 13.1 % (ref 11.6–15.4)
WBC: 5.4 10*3/uL (ref 3.4–10.8)

## 2023-08-25 LAB — COMPREHENSIVE METABOLIC PANEL
ALT: 11 [IU]/L (ref 0–44)
AST: 17 [IU]/L (ref 0–40)
Albumin: 4.4 g/dL (ref 3.8–4.8)
Alkaline Phosphatase: 58 [IU]/L (ref 44–121)
BUN/Creatinine Ratio: 15 (ref 10–24)
BUN: 24 mg/dL (ref 8–27)
Bilirubin Total: 0.3 mg/dL (ref 0.0–1.2)
CO2: 28 mmol/L (ref 20–29)
Calcium: 9.1 mg/dL (ref 8.6–10.2)
Chloride: 101 mmol/L (ref 96–106)
Creatinine, Ser: 1.57 mg/dL — ABNORMAL HIGH (ref 0.76–1.27)
Globulin, Total: 1.6 g/dL (ref 1.5–4.5)
Sodium: 143 mmol/L (ref 134–144)
Total Protein: 6 g/dL (ref 6.0–8.5)
eGFR: 45 mL/min/{1.73_m2} — ABNORMAL LOW (ref >59)

## 2023-08-25 LAB — PSA: Prostate Specific Ag, Serum: 0.8 ng/mL (ref 0.0–4.0)

## 2023-08-25 LAB — USTEKINUMAB AND ANTI-USTEK AB: Ustekinumab: 5.1 ug/mL

## 2023-08-25 LAB — C-REACTIVE PROTEIN: CRP: <1 mg/L (ref 0–10)

## 2023-08-27 ENCOUNTER — Other Ambulatory Visit (INDEPENDENT_AMBULATORY_CARE_PROVIDER_SITE_OTHER): Payer: Self-pay | Admitting: Gastroenterology

## 2023-08-27 MED ORDER — PEG 3350-KCL-NA BICARB-NACL 420 G PO SOLR: 4000.0000 mL | Freq: Once | ORAL | 0 refills | Status: AC

## 2023-09-01 DIAGNOSIS — Z9689 Presence of other specified functional implants: Secondary | ICD-10-CM | POA: Diagnosis not present

## 2023-09-01 DIAGNOSIS — M961 Postlaminectomy syndrome, not elsewhere classified: Secondary | ICD-10-CM | POA: Diagnosis not present

## 2023-09-01 DIAGNOSIS — M47816 Spondylosis without myelopathy or radiculopathy, lumbar region: Secondary | ICD-10-CM | POA: Diagnosis not present

## 2023-09-01 DIAGNOSIS — M7918 Myalgia, other site: Secondary | ICD-10-CM | POA: Diagnosis not present

## 2023-09-01 DIAGNOSIS — G8929 Other chronic pain: Secondary | ICD-10-CM | POA: Diagnosis not present

## 2023-09-02 ENCOUNTER — Telehealth (INDEPENDENT_AMBULATORY_CARE_PROVIDER_SITE_OTHER): Payer: Self-pay | Admitting: Gastroenterology

## 2023-09-02 NOTE — Telephone Encounter (Signed)
Pt left voicemail wanting to reschedule procedure until first week of December if possible.  Returned call to patient but had to leave message to return call .

## 2023-09-06 NOTE — Telephone Encounter (Signed)
Pt contacted and would like to keep procedure scheduled for 09/28/23.   Wife called in this morning and left voicemail to see if any cancellations for today, tomorrow or Wednesday. Returned call to pt and advised no openings for this week at this time

## 2023-09-24 ENCOUNTER — Other Ambulatory Visit: Payer: Self-pay

## 2023-09-24 ENCOUNTER — Encounter (HOSPITAL_COMMUNITY)
Admission: RE | Admit: 2023-09-24 | Discharge: 2023-09-24 | Disposition: A | Discharge status: Home / Self Care | Payer: No Typology Code available for payment source | Source: Ambulatory Visit | Attending: Gastroenterology | Admitting: Gastroenterology

## 2023-09-24 ENCOUNTER — Encounter (HOSPITAL_COMMUNITY): Payer: Self-pay

## 2023-09-24 DIAGNOSIS — L03011 Cellulitis of right finger: Secondary | ICD-10-CM | POA: Diagnosis not present

## 2023-09-28 ENCOUNTER — Ambulatory Visit (HOSPITAL_COMMUNITY): Payer: No Typology Code available for payment source | Admitting: Anesthesiology

## 2023-09-28 ENCOUNTER — Ambulatory Visit (HOSPITAL_COMMUNITY)
Admission: RE | Admit: 2023-09-28 | Discharge: 2023-09-28 | Disposition: A | Discharge status: Home / Self Care | Payer: No Typology Code available for payment source | Attending: Gastroenterology | Admitting: Gastroenterology

## 2023-09-28 ENCOUNTER — Encounter (HOSPITAL_COMMUNITY): Payer: Self-pay | Admitting: Gastroenterology

## 2023-09-28 ENCOUNTER — Ambulatory Visit (HOSPITAL_BASED_OUTPATIENT_CLINIC_OR_DEPARTMENT_OTHER): Payer: No Typology Code available for payment source | Admitting: Anesthesiology

## 2023-09-28 ENCOUNTER — Other Ambulatory Visit: Payer: Self-pay

## 2023-09-28 ENCOUNTER — Encounter (HOSPITAL_COMMUNITY)
Admission: RE | Disposition: A | Discharge status: Home / Self Care | Payer: Self-pay | Source: Home / Self Care | Attending: Gastroenterology

## 2023-09-28 DIAGNOSIS — K529 Noninfective gastroenteritis and colitis, unspecified: Secondary | ICD-10-CM | POA: Diagnosis not present

## 2023-09-28 DIAGNOSIS — I252 Old myocardial infarction: Secondary | ICD-10-CM | POA: Diagnosis not present

## 2023-09-28 DIAGNOSIS — Z955 Presence of coronary angioplasty implant and graft: Secondary | ICD-10-CM | POA: Diagnosis not present

## 2023-09-28 DIAGNOSIS — I517 Cardiomegaly: Secondary | ICD-10-CM | POA: Diagnosis not present

## 2023-09-28 DIAGNOSIS — I2510 Atherosclerotic heart disease of native coronary artery without angina pectoris: Secondary | ICD-10-CM | POA: Insufficient documentation

## 2023-09-28 DIAGNOSIS — Z85038 Personal history of other malignant neoplasm of large intestine: Secondary | ICD-10-CM | POA: Insufficient documentation

## 2023-09-28 DIAGNOSIS — K5090 Crohn's disease, unspecified, without complications: Secondary | ICD-10-CM | POA: Diagnosis present

## 2023-09-28 DIAGNOSIS — K50019 Crohn's disease of small intestine with unspecified complications: Secondary | ICD-10-CM

## 2023-09-28 DIAGNOSIS — K5000 Crohn's disease of small intestine without complications: Secondary | ICD-10-CM | POA: Diagnosis not present

## 2023-09-28 DIAGNOSIS — K648 Other hemorrhoids: Secondary | ICD-10-CM | POA: Insufficient documentation

## 2023-09-28 DIAGNOSIS — M1990 Unspecified osteoarthritis, unspecified site: Secondary | ICD-10-CM | POA: Insufficient documentation

## 2023-09-28 DIAGNOSIS — I493 Ventricular premature depolarization: Secondary | ICD-10-CM | POA: Insufficient documentation

## 2023-09-28 DIAGNOSIS — K5730 Diverticulosis of large intestine without perforation or abscess without bleeding: Secondary | ICD-10-CM | POA: Insufficient documentation

## 2023-09-28 HISTORY — PX: COLONOSCOPY WITH PROPOFOL: SHX5780

## 2023-09-28 HISTORY — PX: BIOPSY: SHX5522

## 2023-09-28 SURGERY — COLONOSCOPY WITH PROPOFOL
Anesthesia: General

## 2023-09-28 MED ORDER — PROPOFOL 10 MG/ML IV BOLUS
INTRAVENOUS | Status: DC | PRN
  Administered 2023-09-28: 100 mg via INTRAVENOUS

## 2023-09-28 MED ORDER — PROPOFOL 500 MG/50ML IV EMUL
INTRAVENOUS | Status: DC | PRN
  Administered 2023-09-28: 150 ug/kg/min via INTRAVENOUS

## 2023-09-28 MED ORDER — LACTATED RINGERS IV SOLN: INTRAVENOUS | Status: DC | PRN

## 2023-09-28 MED ORDER — LIDOCAINE HCL (PF) 2 % IJ SOLN
INTRAMUSCULAR | Status: DC | PRN
  Administered 2023-09-28: 50 mg via INTRADERMAL

## 2023-09-28 NOTE — Op Note (Signed)
[facility] Patient Name: Fady Hales Procedure Date: 09/28/2023 7:19 AM MRN: [id number] Date of Birth: 25-Aug-1946 Attending MD: Katrinka Blazing , , [id number] CSN: [id number] Age: 77 Admit Type: Outpatient Procedure:                Colonoscopy Indications:              Assess therapeutic response to therapy of Crohn's                            disease of the small bowel Providers:                Katrinka Blazing, Edrick Kins, RN, Zena Amos Referring MD:              Medicines:                Monitored Anesthesia Care Complications:             Estimated Blood Loss:     Estimated blood loss: none. Estimated blood loss:                            none. Procedure:                Pre-Anesthesia Assessment:                           - Prior to the procedure, a History and Physical                            was performed, and patient medications, allergies                            and sensitivities were reviewed. The patient's                            tolerance of previous anesthesia was reviewed.                           - The risks and benefits of the procedure and the                            sedation options and risks were discussed with the                            patient. All questions were answered and informed                            consent was obtained.                           After obtaining informed consent, the colonoscope                            was passed under direct vision. Throughout the                            procedure, the patient's blood pressure, pulse,  and                            oxygen saturations were monitored continuously. The                            [id number]-HQ190L ([id number]) scope was introduced through                            the anus and advanced to the 15 cm into the ileum.                            The colonoscopy was performed without difficulty.                            The patient tolerated the procedure  well. The                            quality of the bowel preparation was excellent. Scope In: 9:40:51 AM Scope Out: 10:04:06 AM Scope Withdrawal Time: 0 hours 18 minutes 34 seconds  Total Procedure Duration: 0 hours 23 minutes 15 seconds  Findings:      The perianal and digital rectal examinations were normal.      Patchy inflammation characterized by congestion (edema), erosions and       shallow ulcerations was found in the terminal ileum. The inflammation       was moderate in severity. In comparison to previous colonoscopy, the       inflammation appeared to be less severe and extensive, no deep ulcers       were seen. Biopsies were taken with a cold forceps for histology.      A few small-mouthed diverticula were found in the sigmoid colon.      Non-bleeding internal hemorrhoids were found during retroflexion. The       hemorrhoids were small. Impression:               - Crohn's disease with ileitis. Inflammation was                            found. This was moderate in severity but better                            compared to prior. Biopsied.                           - Diverticulosis in the sigmoid colon.                           - Non-bleeding internal hemorrhoids. Moderate Sedation:      Per Anesthesia Care Recommendation:           - Discharge patient to home (ambulatory).                           - Resume previous diet.                           - Await  pathology results.                           - Repeat colonoscopy in 1 year for surveillance.                           - Will discuss with patient possibility of                            increasing Stelara to every 4 week dosing. Procedure Code(s):        --- Professional ---                           [phone], Colonoscopy, flexible; with biopsy, single                            or multiple Diagnosis Code(s):        --- Professional ---                           K50.00, Crohn's disease of small intestine without                             complications                           K64.8, Other hemorrhoids                           K57.30, Diverticulosis of large intestine without                            perforation or abscess without bleeding CPT copyright 2022 American [employer] Association. All rights reserved. The codes documented in this report are preliminary and upon coder review may  be revised to meet current compliance requirements. Katrinka Blazing, MD Katrinka Blazing,  09/28/2023 10:14:33 AM This report has been signed electronically. Number of Addenda: 0

## 2023-09-28 NOTE — Anesthesia Preprocedure Evaluation (Signed)
Anesthesia Evaluation  Patient identified by MRN, date of birth, ID band Patient awake    Reviewed: Allergy & Precautions, H&P , NPO status , Patient's Chart, lab work & pertinent test results, reviewed documented beta blocker date and time   History of Anesthesia Complications (+) PONV and history of anesthetic complications  Airway Mallampati: I  TM Distance: >3 FB Neck ROM: Full    Dental no notable dental hx. (+) Dental Advisory Given, Teeth Intact   Pulmonary neg pulmonary ROS   Pulmonary exam normal breath sounds clear to auscultation       Cardiovascular Exercise Tolerance: Good (-) hypertensionPt. on home beta blockers + CAD, + Past MI and + Cardiac Stents  Normal cardiovascular exam+ dysrhythmias (PVCs on preop 3 lead EKG)  Rhythm:Irregular Rate:Normal + Systolic murmurs [id number] 11:29:49 [facility] ROUTINE RECORD [birth year] (76 yr) Male Caucasian Vent. rate 67 BPM PR interval 168 ms QRS duration 164 ms QT/QTcB 406/429 ms P-R-T axes 41 -64 -6 Normal sinus rhythm Right bundle branch block Left anterior fascicular block  Bifascicular block  Minimal voltage criteria for LVH, may be normal variant ( R in aVL ) Septal infarct , age undetermined Abnormal ECG When compared with ECG of 10-Jun-2020 11:04, PREVIOUS ECG IS PRESENT Left ventricular hypertrophy New since previous tracing ... Referred by: Fara Chute Confirmed By: Olga Millers   Neuro/Psych negative neurological ROS  negative psych ROS   GI/Hepatic Neg liver ROS, Bowel prep,,,Crohn's disease   Endo/Other  negative endocrine ROS    Renal/GU Renal disease  negative genitourinary   Musculoskeletal  (+) Arthritis , Osteoarthritis,    Abdominal   Peds negative pediatric ROS (+)  Hematology  (+) Blood dyscrasia, anemia   Anesthesia Other Findings Spinal cord stimulator Back sx  Reproductive/Obstetrics negative OB  ROS                             Anesthesia Physical Anesthesia Plan  ASA: 3  Anesthesia Plan: General   Post-op Pain Management: Minimal or no pain anticipated   Induction: Intravenous  PONV Risk Score and Plan: Treatment may vary due to age or medical condition  Airway Management Planned: Nasal Cannula and Natural Airway  Additional Equipment: None  Intra-op Plan:   Post-operative Plan:   Informed Consent: I have reviewed the patients History and Physical, chart, labs and discussed the procedure including the risks, benefits and alternatives for the proposed anesthesia with the patient or authorized representative who has indicated his/her understanding and acceptance.     Dental advisory given  Plan Discussed with: CRNA  Anesthesia Plan Comments:         Anesthesia Quick Evaluation

## 2023-09-28 NOTE — Discharge Instructions (Signed)
You are being discharged to home.  Resume your previous diet.  We are waiting for your pathology results.  Your physician has recommended a repeat colonoscopy in one year for surveillance.  Will discuss with patient possibility of increasing Stelara to every 4 week dosing.

## 2023-09-28 NOTE — Anesthesia Postprocedure Evaluation (Signed)
Anesthesia Post Note  Patient: Bruce Stewart  Procedure(s) Performed: COLONOSCOPY WITH PROPOFOL BIOPSY  Patient location during evaluation: PACU Anesthesia Type: General Level of consciousness: awake and alert Pain management: pain level controlled Vital Signs Assessment: post-procedure vital signs reviewed and stable Respiratory status: spontaneous breathing, nonlabored ventilation, respiratory function stable and patient connected to nasal cannula oxygen Cardiovascular status: blood pressure returned to baseline and stable Postop Assessment: no apparent nausea or vomiting Anesthetic complications: no   There were no known notable events for this encounter.   Last Vitals:  Vitals:   09/28/23 0804 09/28/23 1007  BP: (!) 179/111 (!) 116/59  Pulse: 84 82  Resp: (!) 21 18  Temp: 36.8 C 36.7 C  SpO2: 95% 95%    Last Pain:  Vitals:   09/28/23 1007  TempSrc:   PainSc: 0-No pain                 Michayla Mcneil L Rashee Marschall

## 2023-09-28 NOTE — H&P (Signed)
Bruce Stewart is an 77 y.o. male.   Chief Complaint: CD HPI: Bruce Stewart is a 77 y.o. male with PMH moderate Crohn's ileitis, coronary artery disease, history of colon cancer, urolithiasis, who presents for endoscopic surveillance up of Crohn's disease.   Patient reports that he has felt better and states his bowel movements have decreased in frequency.  Still presenting some occasional mental pain in the left side of the abdomen.  No melena, hematochezia, fever, chills.  Past Medical History:  Diagnosis Date   Anal fissure    Anemia    Arthritis    Complication of anesthesia    Coronary artery disease    Diverticulitis    Family hx of colon cancer    History of kidney stones    Kidney stone    Myocardial infarction (HCC) 10/2019   PONV (postoperative nausea and vomiting)     Past Surgical History:  Procedure Laterality Date   back surgery x 6 Other]     BIOPSY  02/11/2018   Procedure: BIOPSY;  Surgeon: Malissa Hippo, MD;  [facility];  Service: Endoscopy;;  ileal   BIOPSY  06/12/2020   Procedure: BIOPSY;  Surgeon: Malissa Hippo, MD;  [facility];  Service: Endoscopy;;  antral, ileal   BIOPSY  12/04/2022   Procedure: BIOPSY;  Surgeon: Dolores Frame, MD;  [facility];  Service: Gastroenterology;;   CARDIAC CATHETERIZATION  10/2019   CIRCUMCISION     COLONOSCOPY N/A 09/12/2015   Procedure: COLONOSCOPY;  Surgeon: Malissa Hippo, MD;  [facility];  Service: Endoscopy;  Laterality: N/A;  2:25-moved up to 930 Ann notified pt   COLONOSCOPY N/A 02/11/2018   Procedure: COLONOSCOPY;  Surgeon: Malissa Hippo, MD;  [facility];  Service: Endoscopy;  Laterality: N/A;  9:15-moved to 1015 Ann to notify pt   COLONOSCOPY WITH PROPOFOL N/A 06/12/2020   Procedure: COLONOSCOPY WITH PROPOFOL;  Surgeon: Malissa Hippo, MD;  [facility];  Service: Endoscopy;  Laterality: N/A;  830   COLONOSCOPY  WITH PROPOFOL N/A 12/04/2022   Procedure: COLONOSCOPY WITH PROPOFOL;  Surgeon: Dolores Frame, MD;  [facility];  Service: Gastroenterology;  Laterality: N/A;  1230pm, asa 3   CORONARY ANGIOPLASTY WITH STENT PLACEMENT  10/2019   ESOPHAGOGASTRODUODENOSCOPY (EGD) WITH PROPOFOL N/A 06/12/2020   Procedure: ESOPHAGOGASTRODUODENOSCOPY (EGD) WITH PROPOFOL;  Surgeon: Malissa Hippo, MD;  [facility];  Service: Endoscopy;  Laterality: N/A;   POLYPECTOMY  06/12/2020   Procedure: POLYPECTOMY;  Surgeon: Malissa Hippo, MD;  [facility];  Service: Endoscopy;;  gatric   repair of anal fissure     spincterotomy   SPINAL CORD STIMULATOR IMPLANT     SPINE SURGERY N/A 03/31/2021   spine stimulator   TOTAL HIP ARTHROPLASTY Left 06/26/2020   Procedure: TOTAL HIP ARTHROPLASTY ANTERIOR APPROACH;  Surgeon: Ollen Gross, MD;  Location: [facility];  Service: Orthopedics;  Laterality: Left;     Family History  Problem Relation Age of Onset   Diabetes Mother    Colon cancer Father    Social History:  reports that he has never smoked. He has never used smokeless tobacco. He reports that he does not drink alcohol and does not use drugs.  Allergies:  Allergies  Allergen Reactions   Codeine Itching   Orange Fruit [Citrus] Itching   Bee Venom Rash    Medications Prior to Admission  Medication Sig Dispense Refill   acetaminophen (TYLENOL) 500 MG tablet Take 1,000 mg by mouth every 8 (eight) hours as needed for mild pain or moderate pain.     aspirin EC 81 MG tablet Take 81 mg by mouth daily. Swallow whole.     Calcium Carbonate Antacid (TUMS PO) Take 1 tablet by mouth daily as needed (heartburn).     Calcium-Magnesium-Zinc (CAL-MAG-ZINC PO) Take 1 tablet by mouth at bedtime.     carvedilol (COREG) 6.25 MG tablet Take 6.25 mg by mouth 2 (two) times daily with a meal.      Cholecalciferol 5000 units TABS Take 5,000 Units by mouth daily.      cyanocobalamin  1000 MCG tablet Take 1,000 mcg by mouth daily.     DULoxetine (CYMBALTA) 30 MG capsule Take 30 mg by mouth daily.     Fish Oil-Cholecalciferol (FISH OIL + D3 PO) Take 1,400 mg by mouth daily.      Melatonin 10 MG TABS Take 10 mg by mouth at bedtime.     Multiple Vitamin (MULTIVITAMIN WITH MINERALS) TABS tablet Take 1 tablet by mouth daily.     OVER THE COUNTER MEDICATION Take 1 tablet by mouth daily. Keto plus ACV Gummie     pantoprazole (PROTONIX) 40 MG tablet TAKE 1 TABLET BY MOUTH DAILY 100 tablet 3   pregabalin (LYRICA) 100 MG capsule Take 100 mg by mouth 3 (three) times daily.     rosuvastatin (CRESTOR) 20 MG tablet Take 20 mg by mouth daily.     tamsulosin (FLOMAX) 0.4 MG CAPS capsule Take 0.8 mg by mouth at bedtime.     traZODone (DESYREL) 50 MG tablet Take 50 mg by mouth at bedtime.     Turmeric (QC TUMERIC COMPLEX PO) Take 1 capsule by mouth daily.     carboxymethylcellulose (REFRESH PLUS) 0.5 % SOLN Place 1 drop into both eyes 3 (three) times daily as needed (dry eyes).     diclofenac Sodium (VOLTAREN) 1 % GEL Apply 1 application topically daily as needed (pain).      ustekinumab (STELARA) 90 MG/ML SOSY injection Inject 90 mg every 8 weeks as directed 1 mL 5    No results found for this or any previous visit (from the past 48 hour(s)). No results found.  Review of Systems  All other systems reviewed and are negative.   Blood pressure (!) 179/111, pulse 84, temperature 98.3 F (36.8 C), temperature source Oral, resp. rate (!) 21, height 6\' 4"  (1.93 m), weight (!) 136.5 kg, SpO2 95%. Physical Exam  GENERAL: The patient is AO x3, in no acute distress. HEENT: Head is normocephalic and atraumatic. EOMI are intact. Mouth is well hydrated and without lesions. NECK: Supple. No masses LUNGS: Clear to auscultation. No presence of rhonchi/wheezing/rales. Adequate chest expansion HEART: RRR, normal s1 and s2. ABDOMEN: Soft, nontender, no guarding, no peritoneal signs, and nondistended.  BS +. No masses. EXTREMITIES: Without any cyanosis, clubbing, rash, lesions or edema. NEUROLOGIC: AOx3, no focal motor deficit. SKIN: no jaundice, no rashes  Assessment/Plan [name] is a 77 y.o. male with PMH moderate Crohn's ileitis, coronary artery disease, history of colon cancer, urolithiasis, who presents for endoscopic surveillance up of Crohn's disease. Will proceed with colonoscopy.  Dolores Frame, MD 09/28/2023, 9:03 AM

## 2023-09-28 NOTE — Transfer of Care (Signed)
Immediate Anesthesia Transfer of Care Note  Patient: Maclean L Stair  Procedure(s) Performed: COLONOSCOPY WITH PROPOFOL BIOPSY  Patient Location: Short Stay  Anesthesia Type:General  Level of Consciousness: awake  Airway & Oxygen Therapy: Patient Spontanous Breathing  Post-op Assessment: Report given to RN and Post -op Vital signs reviewed and stable  Post vital signs: Reviewed and stable  Last Vitals:  Vitals Value Taken Time  BP    Temp 36.7 C 09/28/23 1007  Pulse    Resp    SpO2 95 % 09/28/23 1007    Last Pain:  Vitals:   09/28/23 0931  TempSrc:   PainSc: 0-No pain         Complications: No notable events documented.

## 2023-09-28 NOTE — Anesthesia Procedure Notes (Signed)
Date/Time: 09/28/2023 9:31 AM  Performed by: Julian Reil, CRNAPre-anesthesia Checklist: Patient identified, Emergency Drugs available, Suction available and Patient being monitored Patient Re-evaluated:Patient Re-evaluated prior to induction Oxygen Delivery Method: Nasal cannula Induction Type: IV induction Placement Confirmation: positive ETCO2 Comments: Optiflow High Flow [place] O2 used.

## 2023-09-29 ENCOUNTER — Telehealth (INDEPENDENT_AMBULATORY_CARE_PROVIDER_SITE_OTHER): Payer: Self-pay

## 2023-09-29 LAB — SURGICAL PATHOLOGY

## 2023-09-29 NOTE — Telephone Encounter (Signed)
Pt notified crystal spoke with pharmacy and pharmacy should be contacting patient. He states he is due again dec 4th. I told him to call pharmacy today if he hasn't heard from the by 4 today due to not knowing their holiday hours and to call us back if any issues.

## 2023-09-29 NOTE — Telephone Encounter (Signed)
Bruce Stewart, [name], MD  [name], [name], CMA Hi Bruce Stewart, Lets increase the dose of Stelara to every 4 weeks - patient is on assistance program. Dx: moderate Crohn's ileitis Thanks

## 2023-09-29 NOTE — Telephone Encounter (Signed)
Marguerita Merles, [name], MD  [name], Eisa Conaway L, CMA Hi Vennie Waymire, Lets increase the dose of Stelara to every 4 weeks - patient is on assistance program. Dx: moderate Crohn's ileitis Thanks

## 2023-09-29 NOTE — Telephone Encounter (Signed)
-----   Message from Katrinka Blazing Mayorga sent at 09/28/2023 11:12 PM EST ----- Hi Rollie Hynek,  Lets increase the dose of Stelara to every 4 weeks - patient is on assistance program. Dx: moderate Crohn's ileitis Thanks

## 2023-09-29 NOTE — Telephone Encounter (Signed)
I tried calling patient, no answer and vm full. Will try again at a later time.   I have called [facility] [phone] and gave a verbal increase of Stelara 90 mg/ ml Q four weeks with # 1 with 11 refills. Verbal given to Jeannette Corpus pharmacist with [facility], she says she will contact patient regarding the shipping details.

## 2023-10-05 ENCOUNTER — Encounter (INDEPENDENT_AMBULATORY_CARE_PROVIDER_SITE_OTHER): Payer: Self-pay | Admitting: *Deleted

## 2023-10-05 ENCOUNTER — Encounter (HOSPITAL_COMMUNITY): Payer: Self-pay | Admitting: Gastroenterology

## 2023-10-12 DIAGNOSIS — I350 Nonrheumatic aortic (valve) stenosis: Secondary | ICD-10-CM | POA: Diagnosis not present

## 2023-10-12 DIAGNOSIS — I517 Cardiomegaly: Secondary | ICD-10-CM | POA: Diagnosis not present

## 2023-10-12 DIAGNOSIS — I3481 Nonrheumatic mitral (valve) annulus calcification: Secondary | ICD-10-CM | POA: Diagnosis not present

## 2023-10-12 DIAGNOSIS — R0609 Other forms of dyspnea: Secondary | ICD-10-CM | POA: Diagnosis not present

## 2023-10-12 DIAGNOSIS — I10 Essential (primary) hypertension: Secondary | ICD-10-CM | POA: Diagnosis not present

## 2023-11-10 ENCOUNTER — Telehealth (INDEPENDENT_AMBULATORY_CARE_PROVIDER_SITE_OTHER): Payer: Self-pay

## 2023-11-10 NOTE — Telephone Encounter (Signed)
 Stelara Approved from 11/04/2023-11/01/2024 through [employer] and [place] patient assistance.

## 2023-11-10 NOTE — Telephone Encounter (Signed)
 Marland Kitchen

## 2023-11-23 ENCOUNTER — Telehealth (INDEPENDENT_AMBULATORY_CARE_PROVIDER_SITE_OTHER): Payer: Self-pay | Admitting: Gastroenterology

## 2023-11-23 NOTE — Telephone Encounter (Signed)
Called patient to let him know he is due for a follow up appointment in March-advised him he would have to get an authorization from [employer]stated he would follow up with his PCP there-advised we would schedule the office visit as soon as we received the authorization.

## 2023-12-09 DIAGNOSIS — L608 Other nail disorders: Secondary | ICD-10-CM | POA: Diagnosis not present

## 2024-01-06 DIAGNOSIS — B372 Candidiasis of skin and nail: Secondary | ICD-10-CM | POA: Diagnosis not present

## 2024-01-20 DIAGNOSIS — M79671 Pain in right foot: Secondary | ICD-10-CM | POA: Diagnosis not present

## 2024-01-20 DIAGNOSIS — M779 Enthesopathy, unspecified: Secondary | ICD-10-CM | POA: Diagnosis not present

## 2024-01-20 DIAGNOSIS — M79672 Pain in left foot: Secondary | ICD-10-CM | POA: Diagnosis not present

## 2024-01-20 DIAGNOSIS — B351 Tinea unguium: Secondary | ICD-10-CM | POA: Diagnosis not present

## 2024-01-20 DIAGNOSIS — M79675 Pain in left toe(s): Secondary | ICD-10-CM | POA: Diagnosis not present

## 2024-01-20 DIAGNOSIS — M79674 Pain in right toe(s): Secondary | ICD-10-CM | POA: Diagnosis not present

## 2024-01-20 DIAGNOSIS — L110 Acquired keratosis follicularis: Secondary | ICD-10-CM | POA: Diagnosis not present

## 2024-02-07 ENCOUNTER — Ambulatory Visit (INDEPENDENT_AMBULATORY_CARE_PROVIDER_SITE_OTHER): Admitting: Gastroenterology

## 2024-02-07 ENCOUNTER — Encounter (INDEPENDENT_AMBULATORY_CARE_PROVIDER_SITE_OTHER): Payer: Self-pay | Admitting: Gastroenterology

## 2024-02-07 VITALS — BP 155/75 | HR 65 | Temp 97.5°F | Ht 76.0 in | Wt 293.0 lb

## 2024-02-07 DIAGNOSIS — Z1321 Encounter for screening for nutritional disorder: Secondary | ICD-10-CM

## 2024-02-07 DIAGNOSIS — Z111 Encounter for screening for respiratory tuberculosis: Secondary | ICD-10-CM

## 2024-02-07 DIAGNOSIS — Z1159 Encounter for screening for other viral diseases: Secondary | ICD-10-CM

## 2024-02-07 DIAGNOSIS — K50018 Crohn's disease of small intestine with other complication: Secondary | ICD-10-CM

## 2024-02-07 NOTE — Patient Instructions (Addendum)
 Continue Stelara every 4 weeks Perform blood workup

## 2024-02-07 NOTE — Progress Notes (Signed)
 Bruce Stewart, M.D. Gastroenterology & Hepatology [facility] Gastroenterology [address] [place], [place] [address]  Primary Care Physician: Estanislado Pandy, MD [address] [place] [address]  I will communicate my assessment and recommendations to the referring MD via EMR.  Problems: Moderate Crohn's ileitis on Stelara every 4 weeks   History of Present Illness: Bruce Stewart is a 78 y.o. male with PMH moderate Crohn's ileitis, coronary artery disease, history of colon cancer, urolithiasis, who presents for follow up of Crohn's disease.   The patient was last seen on 08/02/2019 for. At that time, the patient was scheduled to undergo colonoscopy with findings described low.  He was continued on Stelara every 8 weeks.  Advised to receive flu and shingles vaccination.  Patient had labs checked on 08/10/2023.  Ustekinumab level was 5.1 which was considered adequate, negative antibodies.  CBC was within normal limits, CMP showed creatinine of 1.57, BUN 24, electrolytes and liver function were normal.  Given presence of persistent milder inflammation, I considered important discussing increasing the frequency of Stelara to every 4 weeks.  Patient reports he increased his Stelara to every 4 weeks since January. He is moving his bowels 3 times per day, his stools are loose at the end of  the day. He is taking Imodium every other day. He still has some pain episodes in his RLQ after having a BM, lasts around 10 minutes.  The patient denies having any nausea, vomiting, fever, chills, hematochezia, melena, hematemesis, abdominal distention, jaundice, pruritus or weight loss.  Last flu shot: 2024 Last pneumonia shot: already received it Last zoster vaccine: already received it COVID-19 shot: Moderna  Last EGD: 06/12/2020, normal esophagus, erythema granularity in the antrum (moderate gastropathy with negative H. pylori staining), with presence of 2  polyps in the gastric fundus (biopsies showed fundic gland polyps), normal duodenal bulb.   Last Colonoscopy: 09/28/2023 There was presence of erosions and shallow ulcerations in the terminal ileum with congestion, which appeared to be better compared to prior but not in complete remission.  There was presence of diverticulosis.  Past Medical History: Past Medical History:  Diagnosis Date   Anal fissure    Anemia    Arthritis    Complication of anesthesia    Coronary artery disease    Diverticulitis    Family hx of colon cancer    History of kidney stones    Kidney stone    Myocardial infarction (HCC) 10/2019   PONV (postoperative nausea and vomiting)     Past Surgical History: Past Surgical History:  Procedure Laterality Date   back surgery x 6 Other]     BIOPSY  02/11/2018   Procedure: BIOPSY;  Surgeon: Malissa Hippo, MD;  [facility];  Service: Endoscopy;;  ileal   BIOPSY  06/12/2020   Procedure: BIOPSY;  Surgeon: Malissa Hippo, MD;  [facility];  Service: Endoscopy;;  antral, ileal   BIOPSY  12/04/2022   Procedure: BIOPSY;  Surgeon: Dolores Frame, MD;  [facility];  Service: Gastroenterology;;   BIOPSY  09/28/2023   Procedure: BIOPSY;  Surgeon: Dolores Frame, MD;  [facility];  Service: Gastroenterology;;   CARDIAC CATHETERIZATION  10/2019   CIRCUMCISION     COLONOSCOPY N/A 09/12/2015   Procedure: COLONOSCOPY;  Surgeon: Malissa Hippo, MD;  [facility];  Service: Endoscopy;  Laterality: N/A;  2:25-moved up to [address]  notified pt   COLONOSCOPY N/A 02/11/2018   Procedure: COLONOSCOPY;  Surgeon: Malissa Hippo, MD;  [facility];  Service: Endoscopy;  Laterality: N/A;  9:15-moved to 1015 Ann to notify pt   COLONOSCOPY WITH PROPOFOL N/A 06/12/2020   Procedure: COLONOSCOPY WITH PROPOFOL;  Surgeon: Malissa Hippo, MD;  [facility];  Service: Endoscopy;  Laterality:  N/A;  830   COLONOSCOPY WITH PROPOFOL N/A 12/04/2022   Procedure: COLONOSCOPY WITH PROPOFOL;  Surgeon: Dolores Frame, MD;  [facility];  Service: Gastroenterology;  Laterality: N/A;  1230pm, asa 3   COLONOSCOPY WITH PROPOFOL N/A 09/28/2023   Procedure: COLONOSCOPY WITH PROPOFOL;  Surgeon: Dolores Frame, MD;  [facility];  Service: Gastroenterology;  Laterality: N/A;  9:15AM;ASA 3   CORONARY ANGIOPLASTY WITH STENT PLACEMENT  10/2019   ESOPHAGOGASTRODUODENOSCOPY (EGD) WITH PROPOFOL N/A 06/12/2020   Procedure: ESOPHAGOGASTRODUODENOSCOPY (EGD) WITH PROPOFOL;  Surgeon: Malissa Hippo, MD;  [facility];  Service: Endoscopy;  Laterality: N/A;   POLYPECTOMY  06/12/2020   Procedure: POLYPECTOMY;  Surgeon: Malissa Hippo, MD;  [facility];  Service: Endoscopy;;  gatric   repair of anal fissure     spincterotomy   SPINAL CORD STIMULATOR IMPLANT     SPINE SURGERY N/A 03/31/2021   spine stimulator   TOTAL HIP ARTHROPLASTY Left 06/26/2020   Procedure: TOTAL HIP ARTHROPLASTY ANTERIOR APPROACH;  Surgeon: Ollen Gross, MD;  [facility];  Service: Orthopedics;  Laterality: Left;     Family History: Family History  Problem Relation Age of Onset   Diabetes Mother    Colon cancer Father     Social History: Social History   Tobacco Use  Smoking Status Never  Smokeless Tobacco Never   Social History   Substance and Sexual Activity  Alcohol Use No   Alcohol/week: 0.0 standard drinks of alcohol   Social History   Substance and Sexual Activity  Drug Use No    Allergies: Allergies  Allergen Reactions   Codeine Itching   Orange Fruit [Citrus] Itching   Bee Venom Rash    Medications: Current Outpatient Medications  Medication Sig Dispense Refill   acetaminophen (TYLENOL) 500 MG tablet Take 1,000 mg by mouth every 8 (eight) hours as needed for mild pain or moderate pain.     aspirin EC 81 MG tablet Take  81 mg by mouth daily. Swallow whole.     Calcium Carbonate Antacid (TUMS PO) Take 1 tablet by mouth daily as needed (heartburn).     Calcium-Magnesium-Zinc (CAL-MAG-ZINC PO) Take 1 tablet by mouth at bedtime.     carvedilol (COREG) 6.25 MG tablet Take 6.25 mg by mouth 2 (two) times daily with a meal.      Cholecalciferol 5000 units TABS Take 5,000 Units by mouth daily.      cyanocobalamin 1000 MCG tablet Take 1,000 mcg by mouth daily.     diclofenac Sodium (VOLTAREN) 1 % GEL Apply 1 application topically daily as needed (pain).      DULoxetine (CYMBALTA) 30 MG capsule Take 30 mg by mouth daily.     Fish Oil-Cholecalciferol (FISH OIL + D3 PO) Take 1,400 mg by mouth daily.      Melatonin 10 MG TABS Take 10 mg by mouth at bedtime.     Multiple Vitamin (MULTIVITAMIN WITH MINERALS) TABS tablet Take 1 tablet by mouth daily.     OVER THE COUNTER MEDICATION Take 1 tablet by mouth daily.  Keto plus ACV Gummie     pantoprazole (PROTONIX) 40 MG tablet TAKE 1 TABLET BY MOUTH DAILY 100 tablet 3   pregabalin (LYRICA) 100 MG capsule Take 100 mg by mouth 3 (three) times daily.     rosuvastatin (CRESTOR) 20 MG tablet Take 20 mg by mouth daily.     tamsulosin (FLOMAX) 0.4 MG CAPS capsule Take 0.8 mg by mouth at bedtime.     traZODone (DESYREL) 50 MG tablet Take 50 mg by mouth at bedtime.     Turmeric (QC TUMERIC COMPLEX PO) Take 1 capsule by mouth daily.     ustekinumab (STELARA) 90 MG/ML SOSY injection Inject 90 mg every 8 weeks as directed 1 mL 5   No current facility-administered medications for this visit.    Review of Systems: GENERAL: negative for malaise, night sweats HEENT: No changes in hearing or vision, no nose bleeds or other nasal problems. NECK: Negative for lumps, goiter, pain and significant neck swelling RESPIRATORY: Negative for cough, wheezing CARDIOVASCULAR: Negative for chest pain, leg swelling, palpitations, orthopnea GI: SEE HPI MUSCULOSKELETAL: Negative for joint pain or swelling,  back pain, and muscle pain. SKIN: Negative for lesions, rash PSYCH: Negative for sleep disturbance, mood disorder and recent psychosocial stressors. HEMATOLOGY Negative for prolonged bleeding, bruising easily, and swollen nodes. ENDOCRINE: Negative for cold or heat intolerance, polyuria, polydipsia and goiter. NEURO: negative for tremor, gait imbalance, syncope and seizures. The remainder of the review of systems is noncontributory.   Physical Exam: BP (!) 155/75   Pulse 65   Temp (!) 97.5 F (36.4 C)   Ht 6\' 4"  (1.93 m)   Wt 293 lb (132.9 kg)   BMI 35.67 kg/m  GENERAL: The patient is AO x3, in no acute distress. HEENT: Head is normocephalic and atraumatic. EOMI are intact. Mouth is well hydrated and without lesions. NECK: Supple. No masses LUNGS: Clear to auscultation. No presence of rhonchi/wheezing/rales. Adequate chest expansion HEART: RRR, normal s1 and s2. ABDOMEN: Soft, nontender, no guarding, no peritoneal signs, and nondistended. BS +. No masses. EXTREMITIES: Without any cyanosis, clubbing, rash, lesions or edema. NEUROLOGIC: AOx3, no focal motor deficit. SKIN: no jaundice, no rashes  Imaging/Labs: as above  I personally reviewed and interpreted the available labs, imaging and endoscopic files.  Impression and Plan: Bruce Stewart is a 78 y.o. male with PMH moderate Crohn's ileitis, coronary artery disease, history of colon cancer, urolithiasis, who presents for follow up of Crohn's disease.  Patient has presented some improvement of his symptoms while using Stelara.  However, most recent colonoscopy showed presence of active ongoing inflammation, which was better compared to prior.  Due to these, I advised the patient to increase the dosing to every 4 weeks which he has tried for the last few months.  We discussed that we may notice further improvement of his symptoms after he has received at least 4-5 doses of the medication at the current interval.  For now, we  will check surveillance labs.  Patient is up-to-date in terms of his preventative measures.  We will determine timing for repeat colonoscopy in follow-up appointment, once patient has achieved further clinical improvement.  -Continue Stelara every 4 weeks - Check CBC, CMP, CRP, iron stores, vitamin D, TB/hepatitis B screening -Continue with Imodium as needed when presenting more than 3 pounds per day  All questions were answered.      Bruce Blazing, MD Gastroenterology and Hepatology [facility] Gastroenterology

## 2024-02-10 LAB — COMPREHENSIVE METABOLIC PANEL WITH GFR
AG Ratio: 2.2 (calc) (ref 1.0–2.5)
ALT: 12 U/L (ref 9–46)
AST: 15 U/L (ref 10–35)
Albumin: 4 g/dL (ref 3.6–5.1)
Alkaline phosphatase (APISO): 51 U/L (ref 35–144)
BUN: 19 mg/dL (ref 7–25)
CO2: 30 mmol/L (ref 20–32)
Calcium: 9 mg/dL (ref 8.6–10.3)
Chloride: 104 mmol/L (ref 98–110)
Creat: 1.23 mg/dL (ref 0.70–1.28)
Globulin: 1.8 g/dL — ABNORMAL LOW (ref 1.9–3.7)
Glucose, Bld: 93 mg/dL (ref 65–99)
Potassium: 4.9 mmol/L (ref 3.5–5.3)
Sodium: 141 mmol/L (ref 135–146)
Total Bilirubin: 0.4 mg/dL (ref 0.2–1.2)
Total Protein: 5.8 g/dL — ABNORMAL LOW (ref 6.1–8.1)
eGFR: 60 mL/min/{1.73_m2} (ref >=60)

## 2024-02-10 LAB — CBC WITH DIFFERENTIAL/PLATELET
Absolute Lymphocytes: 1627 {cells}/uL (ref 850–3900)
Absolute Monocytes: 418 {cells}/uL (ref 200–950)
Basophils Absolute: 20 {cells}/uL (ref 0–200)
Basophils Relative: 0.4 %
Eosinophils Absolute: 128 {cells}/uL (ref 15–500)
Eosinophils Relative: 2.5 %
HCT: 38.5 % (ref 38.5–50.0)
Hemoglobin: 13 g/dL — ABNORMAL LOW (ref 13.2–17.1)
MCH: 31.2 pg (ref 27.0–33.0)
MCHC: 33.8 g/dL (ref 32.0–36.0)
MCV: 92.3 fL (ref 80.0–100.0)
MPV: 11.9 fL (ref 7.5–12.5)
Monocytes Relative: 8.2 %
Neutro Abs: 2907 {cells}/uL (ref 1500–7800)
Neutrophils Relative %: 57 %
Platelets: 202 10*3/uL (ref 140–400)
RBC: 4.17 10*6/uL — ABNORMAL LOW (ref 4.20–5.80)
RDW: 13.3 % (ref 11.0–15.0)
Total Lymphocyte: 31.9 %
WBC: 5.1 10*3/uL (ref 3.8–10.8)

## 2024-02-10 LAB — QUANTIFERON-TB GOLD PLUS
Mitogen-NIL: 7.15 [IU]/mL
NIL: 0.04 [IU]/mL
QuantiFERON-TB Gold Plus: NEGATIVE
TB1-NIL: 0 [IU]/mL
TB2-NIL: 0 [IU]/mL

## 2024-02-10 LAB — IRON,TIBC AND FERRITIN PANEL
%SAT: 21 % (ref 20–48)
Ferritin: 38 ng/mL (ref 24–380)
Iron: 64 ug/dL (ref 50–180)
TIBC: 312 ug/dL (ref 250–425)

## 2024-02-10 LAB — HEPATITIS B SURFACE ANTIGEN: Hepatitis B Surface Ag: NONREACTIVE

## 2024-02-10 LAB — VITAMIN D 25 HYDROXY (VIT D DEFICIENCY, FRACTURES): Vit D, 25-Hydroxy: 64 ng/mL (ref 30–100)

## 2024-02-10 LAB — C-REACTIVE PROTEIN: CRP: <3 mg/L (ref <8.0)

## 2024-02-21 NOTE — ED Notes (Signed)
 In chart to confirm follow up appt

## 2024-03-05 ENCOUNTER — Other Ambulatory Visit (INDEPENDENT_AMBULATORY_CARE_PROVIDER_SITE_OTHER): Payer: Self-pay | Admitting: Gastroenterology

## 2024-03-05 DIAGNOSIS — K633 Ulcer of intestine: Secondary | ICD-10-CM

## 2024-04-03 DIAGNOSIS — M79675 Pain in left toe(s): Secondary | ICD-10-CM | POA: Diagnosis not present

## 2024-04-03 DIAGNOSIS — L600 Ingrowing nail: Secondary | ICD-10-CM | POA: Diagnosis not present

## 2024-04-03 DIAGNOSIS — L03032 Cellulitis of left toe: Secondary | ICD-10-CM | POA: Diagnosis not present

## 2024-04-03 DIAGNOSIS — M79672 Pain in left foot: Secondary | ICD-10-CM | POA: Diagnosis not present

## 2024-04-20 DIAGNOSIS — L600 Ingrowing nail: Secondary | ICD-10-CM | POA: Diagnosis not present

## 2024-04-20 DIAGNOSIS — L03032 Cellulitis of left toe: Secondary | ICD-10-CM | POA: Diagnosis not present

## 2024-04-20 DIAGNOSIS — M79675 Pain in left toe(s): Secondary | ICD-10-CM | POA: Diagnosis not present

## 2024-04-20 DIAGNOSIS — M79672 Pain in left foot: Secondary | ICD-10-CM | POA: Diagnosis not present

## 2024-04-26 DIAGNOSIS — S93332D Other subluxation of left foot, subsequent encounter: Secondary | ICD-10-CM | POA: Diagnosis not present

## 2024-04-26 DIAGNOSIS — R8290 Unspecified abnormal findings in urine: Secondary | ICD-10-CM | POA: Diagnosis not present

## 2024-04-26 DIAGNOSIS — S93331D Other subluxation of right foot, subsequent encounter: Secondary | ICD-10-CM | POA: Diagnosis not present

## 2024-04-26 DIAGNOSIS — B372 Candidiasis of skin and nail: Secondary | ICD-10-CM | POA: Diagnosis not present

## 2024-04-26 DIAGNOSIS — I739 Peripheral vascular disease, unspecified: Secondary | ICD-10-CM | POA: Diagnosis not present

## 2024-04-26 DIAGNOSIS — B356 Tinea cruris: Secondary | ICD-10-CM | POA: Diagnosis not present

## 2024-04-26 DIAGNOSIS — M79672 Pain in left foot: Secondary | ICD-10-CM | POA: Diagnosis not present

## 2024-04-26 DIAGNOSIS — M79671 Pain in right foot: Secondary | ICD-10-CM | POA: Diagnosis not present

## 2024-04-26 DIAGNOSIS — M79674 Pain in right toe(s): Secondary | ICD-10-CM | POA: Diagnosis not present

## 2024-04-26 DIAGNOSIS — M79675 Pain in left toe(s): Secondary | ICD-10-CM | POA: Diagnosis not present

## 2024-06-15 DIAGNOSIS — I25118 Atherosclerotic heart disease of native coronary artery with other forms of angina pectoris: Secondary | ICD-10-CM | POA: Diagnosis not present

## 2024-06-15 DIAGNOSIS — I10 Essential (primary) hypertension: Secondary | ICD-10-CM | POA: Diagnosis not present

## 2024-06-15 DIAGNOSIS — E785 Hyperlipidemia, unspecified: Secondary | ICD-10-CM | POA: Diagnosis not present

## 2024-06-15 DIAGNOSIS — I350 Nonrheumatic aortic (valve) stenosis: Secondary | ICD-10-CM | POA: Diagnosis not present

## 2024-06-15 DIAGNOSIS — R0609 Other forms of dyspnea: Secondary | ICD-10-CM | POA: Diagnosis not present

## 2024-07-06 DIAGNOSIS — M79671 Pain in right foot: Secondary | ICD-10-CM | POA: Diagnosis not present

## 2024-07-06 DIAGNOSIS — I739 Peripheral vascular disease, unspecified: Secondary | ICD-10-CM | POA: Diagnosis not present

## 2024-07-06 DIAGNOSIS — M79672 Pain in left foot: Secondary | ICD-10-CM | POA: Diagnosis not present

## 2024-07-06 DIAGNOSIS — L110 Acquired keratosis follicularis: Secondary | ICD-10-CM | POA: Diagnosis not present

## 2024-07-06 DIAGNOSIS — S93331D Other subluxation of right foot, subsequent encounter: Secondary | ICD-10-CM | POA: Diagnosis not present

## 2024-07-06 DIAGNOSIS — S93332D Other subluxation of left foot, subsequent encounter: Secondary | ICD-10-CM | POA: Diagnosis not present

## 2024-07-06 DIAGNOSIS — M79675 Pain in left toe(s): Secondary | ICD-10-CM | POA: Diagnosis not present

## 2024-07-06 DIAGNOSIS — M79674 Pain in right toe(s): Secondary | ICD-10-CM | POA: Diagnosis not present

## 2024-07-13 ENCOUNTER — Encounter (INDEPENDENT_AMBULATORY_CARE_PROVIDER_SITE_OTHER): Payer: Self-pay | Admitting: Gastroenterology

## 2024-08-10 ENCOUNTER — Encounter (INDEPENDENT_AMBULATORY_CARE_PROVIDER_SITE_OTHER): Payer: Self-pay | Admitting: Gastroenterology

## 2024-08-10 ENCOUNTER — Ambulatory Visit (INDEPENDENT_AMBULATORY_CARE_PROVIDER_SITE_OTHER): Admitting: Gastroenterology

## 2024-08-10 ENCOUNTER — Telehealth (INDEPENDENT_AMBULATORY_CARE_PROVIDER_SITE_OTHER): Payer: Self-pay

## 2024-08-10 VITALS — BP 144/71 | HR 62 | Temp 97.8°F | Ht 76.0 in | Wt 291.1 lb

## 2024-08-10 DIAGNOSIS — Z800 Family history of malignant neoplasm of digestive organs: Secondary | ICD-10-CM | POA: Diagnosis not present

## 2024-08-10 DIAGNOSIS — Z7982 Long term (current) use of aspirin: Secondary | ICD-10-CM | POA: Diagnosis not present

## 2024-08-10 DIAGNOSIS — Z79899 Other long term (current) drug therapy: Secondary | ICD-10-CM

## 2024-08-10 DIAGNOSIS — K50019 Crohn's disease of small intestine with unspecified complications: Secondary | ICD-10-CM

## 2024-08-10 DIAGNOSIS — Z85038 Personal history of other malignant neoplasm of large intestine: Secondary | ICD-10-CM

## 2024-08-10 DIAGNOSIS — Z7902 Long term (current) use of antithrombotics/antiplatelets: Secondary | ICD-10-CM

## 2024-08-10 NOTE — Patient Instructions (Signed)
 Schedule MR enterography Continue Stelara  every 4 weeks Please obtain flu and COVID vaccination

## 2024-08-10 NOTE — Progress Notes (Unsigned)
 Toribio Fortune, M.D. Gastroenterology & Hepatology [facility] Gastroenterology [address] [place], [place] [address]  Primary Care Physician: Atilano Deward ORN, MD [address] [place] [address]  I will communicate my assessment and recommendations to the referring MD via EMR.  Problems: Moderate Crohn's ileitis on Stelara  every 4 weeks   History of Present Illness: Dmari L Chait is a 78 y.o. male with PMH moderate Crohn's ileitis, coronary artery disease, history of colon cancer, urolithiasis, who presents for follow up of Crohn's disease.   The patient was last seen on 02/07/2024. At that time, the patient was continuing the Stelara  every 4 weeks.  Patient reports that he is having still abdominal pain in the RLQ, similar to what he has had in the past. He states that he usually has 1 BM, but every three days he has 3-4 Bms. Takes Imodium as needed. The patient denies having any nausea, vomiting, fever, chills, hematemesis, abdominal distention, abdominal pain, diarrhea, jaundice, pruritus or weight loss.  Patient advised to take cruise at the end of the year.  Previous medications Stelara , Pentasa   Last flu shot: 2024 Last pneumonia shot: already received it Last zoster vaccine: already received it COVID-19 shot: Moderna Last time he underwent hepatitis B and QuantiFERON testing 02/07/24   Last EGD: 06/12/2020, normal esophagus, erythema granularity in the antrum (moderate gastropathy with negative H. pylori staining), with presence of 2 polyps in the gastric fundus (biopsies showed fundic gland polyps), normal duodenal bulb.   Last Colonoscopy: 09/28/2023 There was presence of erosions and shallow ulcerations in the terminal ileum with congestion, which appeared to be better compared to prior but not in complete remission.  There was presence of diverticulosis.  Past Medical History: Past Medical History:  Diagnosis Date   Anal fissure     Anemia    Arthritis    Complication of anesthesia    Coronary artery disease    Diverticulitis    Family hx of colon cancer    History of kidney stones    Kidney stone    Myocardial infarction (HCC) 10/2019   PONV (postoperative nausea and vomiting)     Past Surgical History: Past Surgical History:  Procedure Laterality Date   back surgery x 6 Other]     BIOPSY  02/11/2018   Procedure: BIOPSY;  Surgeon: Golda Claudis PENNER, MD;  [facility];  Service: Endoscopy;;  ileal   BIOPSY  06/12/2020   Procedure: BIOPSY;  Surgeon: Golda Claudis PENNER, MD;  [facility];  Service: Endoscopy;;  antral, ileal   BIOPSY  12/04/2022   Procedure: BIOPSY;  Surgeon: Fortune Angelia Toribio, MD;  [facility];  Service: Gastroenterology;;   BIOPSY  09/28/2023   Procedure: BIOPSY;  Surgeon: Fortune Angelia Toribio, MD;  [facility];  Service: Gastroenterology;;   CARDIAC CATHETERIZATION  10/2019   CIRCUMCISION     COLONOSCOPY N/A 09/12/2015   Procedure: COLONOSCOPY;  Surgeon: Claudis PENNER Golda, MD;  [facility];  Service: Endoscopy;  Laterality: N/A;  2:25-moved up to 930 Ann notified pt   COLONOSCOPY N/A 02/11/2018   Procedure: COLONOSCOPY;  Surgeon: Golda Claudis PENNER, MD;  [facility];  Service: Endoscopy;  Laterality: N/A;  9:15-moved to 1015 Ann to notify pt   COLONOSCOPY WITH PROPOFOL  N/A 06/12/2020   Procedure: COLONOSCOPY WITH PROPOFOL ;  Surgeon: Golda Claudis PENNER, MD;  [facility];  Service: Endoscopy;  Laterality: N/A;  830   COLONOSCOPY WITH PROPOFOL  N/A 12/04/2022   Procedure: COLONOSCOPY WITH PROPOFOL ;  Surgeon: Eartha Angelia Sieving, MD;  [facility];  Service: Gastroenterology;  Laterality: N/A;  1230pm, asa 3   COLONOSCOPY WITH PROPOFOL  N/A 09/28/2023   Procedure: COLONOSCOPY WITH PROPOFOL ;  Surgeon: Eartha Angelia Sieving, MD;  [facility];  Service: Gastroenterology;  Laterality:  N/A;  9:15AM;ASA 3   CORONARY ANGIOPLASTY WITH STENT PLACEMENT  10/2019   ESOPHAGOGASTRODUODENOSCOPY (EGD) WITH PROPOFOL  N/A 06/12/2020   Procedure: ESOPHAGOGASTRODUODENOSCOPY (EGD) WITH PROPOFOL ;  Surgeon: Golda Claudis PENNER, MD;  [facility];  Service: Endoscopy;  Laterality: N/A;   POLYPECTOMY  06/12/2020   Procedure: POLYPECTOMY;  Surgeon: Golda Claudis PENNER, MD;  [facility];  Service: Endoscopy;;  gatric   repair of anal fissure     spincterotomy   SPINAL CORD STIMULATOR IMPLANT     SPINE SURGERY N/A 03/31/2021   spine stimulator   TOTAL HIP ARTHROPLASTY Left 06/26/2020   Procedure: TOTAL HIP ARTHROPLASTY ANTERIOR APPROACH;  Surgeon: Melodi Lerner, MD;  [facility];  Service: Orthopedics;  Laterality: Left;     Family History: Family History  Problem Relation Age of Onset   Diabetes Mother    Colon cancer Father     Social History: Social History   Tobacco Use  Smoking Status Never  Smokeless Tobacco Never   Social History   Substance and Sexual Activity  Alcohol  Use No   Alcohol /week: 0.0 standard drinks of alcohol    Social History   Substance and Sexual Activity  Drug Use No    Allergies: Allergies  Allergen Reactions   Codeine Itching   Orange Fruit [Citrus] Itching   Bee Venom Rash    Medications: Current Outpatient Medications  Medication Sig Dispense Refill   acetaminophen  (TYLENOL ) 500 MG tablet Take 1,000 mg by mouth every 8 (eight) hours as needed for mild pain or moderate pain.     aspirin  EC 81 MG tablet Take 81 mg by mouth daily. Swallow whole.     Calcium  Carbonate Antacid (TUMS PO) Take 1 tablet by mouth daily as needed (heartburn).     Calcium -Magnesium -Zinc  (CAL-MAG-ZINC  PO) Take 1 tablet by mouth at bedtime.     carvedilol  (COREG ) 6.25 MG tablet Take 6.25 mg by mouth 2 (two) times daily with a meal.      Cholecalciferol 5000 units TABS Take 5,000 Units by mouth daily.      cyanocobalamin 1000 MCG tablet  Take 1,000 mcg by mouth daily.     diclofenac Sodium (VOLTAREN) 1 % GEL Apply 1 application topically daily as needed (pain).      DULoxetine  (CYMBALTA ) 30 MG capsule Take 30 mg by mouth daily.     Fish Oil-Cholecalciferol (FISH OIL + D3 PO) Take 1,400 mg by mouth daily.      Melatonin 10 MG TABS Take 10 mg by mouth at bedtime.     Multiple Vitamin (MULTIVITAMIN WITH MINERALS) TABS tablet Take 1 tablet by mouth daily.     pantoprazole  (PROTONIX ) 40 MG tablet TAKE 1 TABLET BY MOUTH DAILY 100 tablet 2   pregabalin (LYRICA) 100 MG capsule Take 100 mg by mouth 3 (three) times daily.     rosuvastatin  (CRESTOR ) 20 MG tablet Take 20 mg by mouth daily.     tamsulosin  (FLOMAX ) 0.4 MG CAPS capsule Take 0.8 mg by mouth at bedtime.     traZODone (DESYREL) 50 MG tablet Take 50 mg  by mouth at bedtime.     ustekinumab  (STELARA ) 90 MG/ML SOSY injection Inject 90 mg every 8 weeks as directed 1 mL 5   No current facility-administered medications for this visit.    Review of Systems: GENERAL: negative for malaise, night sweats HEENT: No changes in hearing or vision, no nose bleeds or other nasal problems. NECK: Negative for lumps, goiter, pain and significant neck swelling RESPIRATORY: Negative for cough, wheezing CARDIOVASCULAR: Negative for chest pain, leg swelling, palpitations, orthopnea GI: SEE HPI MUSCULOSKELETAL: Negative for joint pain or swelling, back pain, and muscle pain. SKIN: Negative for lesions, rash PSYCH: Negative for sleep disturbance, mood disorder and recent psychosocial stressors. HEMATOLOGY Negative for prolonged bleeding, bruising easily, and swollen nodes. ENDOCRINE: Negative for cold or heat intolerance, polyuria, polydipsia and goiter. NEURO: negative for tremor, gait imbalance, syncope and seizures. The remainder of the review of systems is noncontributory.   Physical Exam: BP (!) 144/71 (BP Location: Left Arm, Patient Position: Sitting, Cuff Size: Large)   Pulse 62   Temp  97.8 F (36.6 C) (Temporal)   Ht 6' 4 (1.93 m)   Wt 291 lb 1.6 oz (132 kg)   BMI 35.43 kg/m  GENERAL: The patient is AO x3, in no acute distress. Obese. HEENT: Head is normocephalic and atraumatic. EOMI are intact. Mouth is well hydrated and without lesions. NECK: Supple. No masses LUNGS: Clear to auscultation. No presence of rhonchi/wheezing/rales. Adequate chest expansion HEART: RRR, normal s1 and s2. ABDOMEN: tender to palpation in the right lower quadrant, no guarding, no peritoneal signs, and nondistended. BS +. No masses. EXTREMITIES: Without any cyanosis, clubbing, rash, lesions or edema. NEUROLOGIC: AOx3, no focal motor deficit. SKIN: no jaundice, no rashes  Imaging/Labs: as above  I personally reviewed and interpreted the available labs, imaging and endoscopic files.  Impression and Plan: Eliazar L Orvis is a 78 y.o. male coming for follow up of ***   All questions were answered.      Toribio Fortune, MD Gastroenterology and Hepatology [facility] Gastroenterology

## 2024-08-10 NOTE — Telephone Encounter (Signed)
 [facility] auth # [id number]  Dates of service: 06/22/2024 - 08/10/2025

## 2024-08-11 NOTE — Telephone Encounter (Signed)
 Spoke with patient, informed him of his MR Entero appt on 08/18/2024 at 245pm. Patient aware and verbalized understanding.

## 2024-08-16 ENCOUNTER — Encounter (INDEPENDENT_AMBULATORY_CARE_PROVIDER_SITE_OTHER): Payer: Self-pay | Admitting: Gastroenterology

## 2024-08-17 ENCOUNTER — Ambulatory Visit (HOSPITAL_COMMUNITY)
Admission: RE | Admit: 2024-08-17 | Discharge: 2024-08-17 | Disposition: A | Discharge status: Home / Self Care | Source: Ambulatory Visit | Attending: Gastroenterology | Admitting: Gastroenterology

## 2024-08-17 ENCOUNTER — Other Ambulatory Visit (INDEPENDENT_AMBULATORY_CARE_PROVIDER_SITE_OTHER): Payer: Self-pay

## 2024-08-17 ENCOUNTER — Other Ambulatory Visit (INDEPENDENT_AMBULATORY_CARE_PROVIDER_SITE_OTHER): Payer: Self-pay | Admitting: Gastroenterology

## 2024-08-17 ENCOUNTER — Encounter (HOSPITAL_COMMUNITY): Payer: Self-pay

## 2024-08-17 DIAGNOSIS — K50019 Crohn's disease of small intestine with unspecified complications: Secondary | ICD-10-CM

## 2024-08-17 NOTE — Telephone Encounter (Signed)
 PA, CT Entero on [facility]: Precertification Not Required

## 2024-08-17 NOTE — Telephone Encounter (Signed)
 Patient could not have MRI due to neuro stimulator. Dr. Eartha Glenroy Crossen/Mindy, please schedule a Ct enterography instead please. Dx: crohns disease . Scheduled patient for a CT on 09/08/2024 at 9:30am. Spoke with patients wife and made her aware of appointment and that the patient needs to arrive at 8:00am.Wife verbalized understanding.

## 2024-08-18 ENCOUNTER — Ambulatory Visit (HOSPITAL_COMMUNITY): Admission: RE | Admit: 2024-08-18 | Source: Ambulatory Visit

## 2024-08-22 ENCOUNTER — Telehealth (INDEPENDENT_AMBULATORY_CARE_PROVIDER_SITE_OTHER): Payer: Self-pay

## 2024-08-22 NOTE — Telephone Encounter (Signed)
 08/22/2024: Per Delon BROCKS with [employer] They have this patient's case open and have reached out to the patient regarding reverification for the 2026 for Stelara . They have this case open and will be reaching out to the patient if they need to do anything else. They are currently checking income levels and house hold size and if patient denied because of this, patient has a right to resubmit current income and house hold size.

## 2024-08-24 NOTE — Telephone Encounter (Signed)
 I spoke with the patient and made him aware, Per Bruce Stewart with [employer]. They have your case open and have reached out to you regarding the reverification process for 2026 for free Stelara . They have this case open and will be reaching out to you if, they need you to do anything else. They are currently checking income levels and house hold size and if denied because of this, You have a right to resubmit current income and house hold size.    Let me know if there are any issues.    Patient states understanding.

## 2024-08-24 NOTE — Telephone Encounter (Signed)
 I called and left the patient a Vm to check his My Chart regarding the reverification of free Medication through [employer].

## 2024-09-07 NOTE — Telephone Encounter (Signed)
 LMOVM to return call   Pt left vm asking about scan that is scheduled for tomorrow. He says he thought the MRI was cancelled.

## 2024-09-08 ENCOUNTER — Ambulatory Visit (HOSPITAL_COMMUNITY)
Admission: RE | Admit: 2024-09-08 | Discharge: 2024-09-08 | Disposition: A | Discharge status: Home / Self Care | Source: Ambulatory Visit | Attending: Gastroenterology | Admitting: Gastroenterology

## 2024-09-08 DIAGNOSIS — K50019 Crohn's disease of small intestine with unspecified complications: Secondary | ICD-10-CM | POA: Diagnosis present

## 2024-09-08 LAB — POCT I-STAT CREATININE: Creatinine, Ser: 1.5 mg/dL — ABNORMAL HIGH (ref 0.61–1.24)

## 2024-09-08 MED ORDER — IOHEXOL 300 MG/ML  SOLN
100.0000 mL | Freq: Once | INTRAMUSCULAR | Status: AC | PRN
  Administered 2024-09-08: 100 mL via INTRAVENOUS

## 2024-09-12 ENCOUNTER — Other Ambulatory Visit (INDEPENDENT_AMBULATORY_CARE_PROVIDER_SITE_OTHER): Payer: Self-pay | Admitting: Gastroenterology

## 2024-09-20 ENCOUNTER — Encounter: Payer: Self-pay | Admitting: *Deleted

## 2024-09-20 ENCOUNTER — Other Ambulatory Visit: Payer: Self-pay | Admitting: *Deleted

## 2024-09-20 ENCOUNTER — Ambulatory Visit (INDEPENDENT_AMBULATORY_CARE_PROVIDER_SITE_OTHER): Payer: Self-pay | Admitting: Gastroenterology

## 2024-09-20 MED ORDER — PEG 3350-KCL-NA BICARB-NACL 420 G PO SOLR: 4000.0000 mL | Freq: Once | ORAL | 0 refills | Status: AC

## 2024-10-06 NOTE — Telephone Encounter (Signed)
 10/06/2024: spoke with Christin W with [employer] w me eligibility still pending for 2026.I accidentally made two accounts on website, they will delete the one from today, and keep the other that is showing pending eligibility.(They will send a email or call me when this happens).

## 2024-10-11 ENCOUNTER — Encounter (HOSPITAL_COMMUNITY): Payer: Self-pay

## 2024-10-11 ENCOUNTER — Encounter (HOSPITAL_COMMUNITY)
Admission: RE | Admit: 2024-10-11 | Discharge: 2024-10-11 | Disposition: A | Discharge status: Home / Self Care | Source: Ambulatory Visit | Attending: Gastroenterology | Admitting: Gastroenterology

## 2024-10-11 ENCOUNTER — Other Ambulatory Visit: Payer: Self-pay

## 2024-10-11 NOTE — Telephone Encounter (Signed)
 I spoke with Marolyn at [employer] with me and he states this patient reverification is still in process.

## 2024-10-12 MED ORDER — PEG 3350-KCL-NA BICARB-NACL 420 G PO SOLR: 4000.0000 mL | Freq: Once | ORAL | 0 refills | Status: AC

## 2024-10-12 NOTE — Addendum Note (Signed)
 Addended by: DALLIE LIONEL RAMAN on: 10/12/2024 09:06 AM   Modules accepted: Orders

## 2024-10-13 ENCOUNTER — Encounter (HOSPITAL_COMMUNITY): Payer: Self-pay | Admitting: Gastroenterology

## 2024-10-13 ENCOUNTER — Ambulatory Visit (HOSPITAL_COMMUNITY): Admitting: Anesthesiology

## 2024-10-13 ENCOUNTER — Encounter (HOSPITAL_COMMUNITY): Admission: RE | Disposition: A | Payer: Self-pay | Source: Home / Self Care | Attending: Gastroenterology

## 2024-10-13 ENCOUNTER — Ambulatory Visit (HOSPITAL_BASED_OUTPATIENT_CLINIC_OR_DEPARTMENT_OTHER): Admitting: Anesthesiology

## 2024-10-13 ENCOUNTER — Ambulatory Visit (HOSPITAL_COMMUNITY)
Admission: RE | Admit: 2024-10-13 | Discharge: 2024-10-13 | Disposition: A | Attending: Gastroenterology | Admitting: Gastroenterology

## 2024-10-13 DIAGNOSIS — K5730 Diverticulosis of large intestine without perforation or abscess without bleeding: Secondary | ICD-10-CM

## 2024-10-13 DIAGNOSIS — K648 Other hemorrhoids: Secondary | ICD-10-CM | POA: Diagnosis not present

## 2024-10-13 DIAGNOSIS — K5000 Crohn's disease of small intestine without complications: Secondary | ICD-10-CM | POA: Diagnosis present

## 2024-10-13 DIAGNOSIS — K635 Polyp of colon: Secondary | ICD-10-CM | POA: Diagnosis not present

## 2024-10-13 DIAGNOSIS — K529 Noninfective gastroenteritis and colitis, unspecified: Secondary | ICD-10-CM | POA: Diagnosis not present

## 2024-10-13 DIAGNOSIS — D122 Benign neoplasm of ascending colon: Secondary | ICD-10-CM | POA: Diagnosis not present

## 2024-10-13 DIAGNOSIS — I2510 Atherosclerotic heart disease of native coronary artery without angina pectoris: Secondary | ICD-10-CM | POA: Diagnosis not present

## 2024-10-13 DIAGNOSIS — I252 Old myocardial infarction: Secondary | ICD-10-CM | POA: Diagnosis not present

## 2024-10-13 DIAGNOSIS — I10 Essential (primary) hypertension: Secondary | ICD-10-CM | POA: Diagnosis not present

## 2024-10-13 LAB — HM COLONOSCOPY

## 2024-10-13 SURGERY — COLONOSCOPY
Anesthesia: Monitor Anesthesia Care

## 2024-10-13 MED ORDER — PROPOFOL 500 MG/50ML IV EMUL
INTRAVENOUS | Status: DC | PRN
  Administered 2024-10-13: 70 mg via INTRAVENOUS
  Administered 2024-10-13: 125 ug/kg/min via INTRAVENOUS
  Administered 2024-10-13: 30 mg via INTRAVENOUS

## 2024-10-13 MED ORDER — LACTATED RINGERS IV SOLN: INTRAVENOUS | Status: DC | PRN

## 2024-10-13 MED ORDER — LIDOCAINE 2% (20 MG/ML) 5 ML SYRINGE
INTRAMUSCULAR | Status: DC | PRN
  Administered 2024-10-13: 60 mg via INTRAVENOUS

## 2024-10-13 MED ORDER — DICYCLOMINE HCL 10 MG PO CAPS: 10.0000 mg | ORAL_CAPSULE | Freq: Two times a day (BID) | ORAL | 1 refills | Status: AC | PRN

## 2024-10-13 NOTE — Discharge Instructions (Addendum)
 Continue Stelara  every 4 weeks. - Start dicyclomine 10 mg every 12 hours as needed for abdominal pain.

## 2024-10-13 NOTE — Anesthesia Preprocedure Evaluation (Signed)
 Anesthesia Evaluation  Patient identified by MRN, date of birth, ID band Patient awake    Reviewed: Allergy & Precautions, H&P , NPO status , Patient's Chart, lab work & pertinent test results, reviewed documented beta blocker date and time   History of Anesthesia Complications (+) PONV and history of anesthetic complications  Airway Mallampati: II  TM Distance: >3 FB Neck ROM: full    Dental no notable dental hx.    Pulmonary neg pulmonary ROS   Pulmonary exam normal breath sounds clear to auscultation       Cardiovascular Exercise Tolerance: Good hypertension, + CAD and + Past MI  negative cardio ROS  Rhythm:regular Rate:Normal     Neuro/Psych negative neurological ROS  negative psych ROS   GI/Hepatic negative GI ROS, Neg liver ROS,,,  Endo/Other  negative endocrine ROS    Renal/GU Renal diseasenegative Renal ROS  negative genitourinary   Musculoskeletal   Abdominal   Peds  Hematology negative hematology ROS (+) Blood dyscrasia, anemia   Anesthesia Other Findings   Reproductive/Obstetrics negative OB ROS                              Anesthesia Physical Anesthesia Plan  ASA: 3  Anesthesia Plan: MAC   Post-op Pain Management:    Induction:   PONV Risk Score and Plan: Propofol  infusion  Airway Management Planned:   Additional Equipment:   Intra-op Plan:   Post-operative Plan:   Informed Consent: I have reviewed the patients History and Physical, chart, labs and discussed the procedure including the risks, benefits and alternatives for the proposed anesthesia with the patient or authorized representative who has indicated his/her understanding and acceptance.     Dental Advisory Given  Plan Discussed with: CRNA  Anesthesia Plan Comments:         Anesthesia Quick Evaluation

## 2024-10-13 NOTE — H&P (Signed)
 Bruce Stewart is an 78 y.o. male.   Chief Complaint: Crohn's disease.  HPI: Bruce Stewart is a 78 y.o. male with PMH moderate Crohn's ileitis, coronary artery disease, history of colon cancer, urolithiasis, who presents for follow up of Crohn's disease.   Patient had presented intermittent issues with abdominal pain and intermittent diarrhea. The patient denies having any nausea, vomiting, fever, chills, hematochezia, melena, hematemesis, abdominal distention,  jaundice, pruritus or weight loss.   Past Medical History:  Diagnosis Date   Anal fissure    Anemia    Arthritis    Complication of anesthesia    Coronary artery disease    Diverticulitis    Family hx of colon cancer    History of kidney stones    Hypertension    Kidney stone    Myocardial infarction (HCC) 10/2019   PONV (postoperative nausea and vomiting)     Past Surgical History:  Procedure Laterality Date   back surgery x 6 Other]     BIOPSY  02/11/2018   Procedure: BIOPSY;  Surgeon: Golda Claudis PENNER, MD;  Location: [facility];  Service: Endoscopy;;  ileal   BIOPSY  06/12/2020   Procedure: BIOPSY;  Surgeon: Golda Claudis PENNER, MD;  Location: [facility];  Service: Endoscopy;;  antral, ileal   BIOPSY  12/04/2022   Procedure: BIOPSY;  Surgeon: Eartha Angelia Sieving, MD;  Location: [facility];  Service: Gastroenterology;;   BIOPSY  09/28/2023   Procedure: BIOPSY;  Surgeon: Eartha Angelia Sieving, MD;  Location: [facility];  Service: Gastroenterology;;   CARDIAC CATHETERIZATION  10/2019   CIRCUMCISION     COLONOSCOPY N/A 09/12/2015   Procedure: COLONOSCOPY;  Surgeon: Claudis PENNER Golda, MD;  Location: [facility];  Service: Endoscopy;  Laterality: N/A;  2:25-moved up to 930 Ann notified pt   COLONOSCOPY N/A 02/11/2018   Procedure: COLONOSCOPY;  Surgeon: Golda Claudis PENNER, MD;  Location: [facility];  Service: Endoscopy;  Laterality: N/A;  9:15-moved to 1015 Ann to notify pt   COLONOSCOPY WITH  PROPOFOL  N/A 06/12/2020   Procedure: COLONOSCOPY WITH PROPOFOL ;  Surgeon: Golda Claudis PENNER, MD;  Location: [facility];  Service: Endoscopy;  Laterality: N/A;  830   COLONOSCOPY WITH PROPOFOL  N/A 12/04/2022   Procedure: COLONOSCOPY WITH PROPOFOL ;  Surgeon: Eartha Angelia Sieving, MD;  Location: [facility];  Service: Gastroenterology;  Laterality: N/A;  1230pm, asa 3   COLONOSCOPY WITH PROPOFOL  N/A 09/28/2023   Procedure: COLONOSCOPY WITH PROPOFOL ;  Surgeon: Eartha Angelia Sieving, MD;  Location: [facility];  Service: Gastroenterology;  Laterality: N/A;  9:15AM;ASA 3   CORONARY ANGIOPLASTY WITH STENT PLACEMENT  10/2019   ESOPHAGOGASTRODUODENOSCOPY (EGD) WITH PROPOFOL  N/A 06/12/2020   Procedure: ESOPHAGOGASTRODUODENOSCOPY (EGD) WITH PROPOFOL ;  Surgeon: Golda Claudis PENNER, MD;  Location: [facility];  Service: Endoscopy;  Laterality: N/A;   POLYPECTOMY  06/12/2020   Procedure: POLYPECTOMY;  Surgeon: Golda Claudis PENNER, MD;  Location: [facility];  Service: Endoscopy;;  gatric   repair of anal fissure     spincterotomy   SPINAL CORD STIMULATOR IMPLANT     SPINE SURGERY N/A 03/31/2021   spine stimulator   TOTAL HIP ARTHROPLASTY Left 06/26/2020   Procedure: TOTAL HIP ARTHROPLASTY ANTERIOR APPROACH;  Surgeon: Melodi Lerner, MD;  Location: [facility];  Service: Orthopedics;  Laterality: Left;     Family History  Problem Relation Age of Onset   Diabetes Mother    Colon cancer Father    Social  History:  reports that he has never smoked. He has never used smokeless tobacco. He reports that he does not drink alcohol  and does not use drugs.  Allergies: Allergies[1]  Medications Prior to Admission  Medication Sig Dispense Refill   acetaminophen  (TYLENOL ) 500 MG tablet Take 1,000 mg by mouth every 8 (eight) hours as needed for mild pain or moderate pain.     aspirin  EC 81 MG tablet Take 81 mg by mouth daily. Swallow whole.     carvedilol  (COREG ) 6.25 MG tablet Take 6.25 mg by mouth  2 (two) times daily with a meal.      Cholecalciferol 5000 units TABS Take 5,000 Units by mouth daily.      cyanocobalamin 1000 MCG tablet Take 1,000 mcg by mouth daily.     diclofenac Sodium (VOLTAREN) 1 % GEL Apply 1 application topically daily as needed (pain).      DULoxetine  (CYMBALTA ) 30 MG capsule Take 30 mg by mouth daily.     Fish Oil-Cholecalciferol (FISH OIL + D3 PO) Take 1,400 mg by mouth daily.      rosuvastatin  (CRESTOR ) 20 MG tablet Take 20 mg by mouth daily.     tamsulosin  (FLOMAX ) 0.4 MG CAPS capsule Take 0.8 mg by mouth at bedtime.     traZODone (DESYREL) 50 MG tablet Take 50 mg by mouth at bedtime.     ustekinumab  (STELARA ) 90 MG/ML SOSY injection Inject 1 syringe under the skin every 4 weeks 1 mL 11   Calcium  Carbonate Antacid (TUMS PO) Take 1 tablet by mouth daily as needed (heartburn).     Calcium -Magnesium -Zinc  (CAL-MAG-ZINC  PO) Take 1 tablet by mouth at bedtime.     Melatonin 10 MG TABS Take 10 mg by mouth at bedtime.     Multiple Vitamin (MULTIVITAMIN WITH MINERALS) TABS tablet Take 1 tablet by mouth daily.     pantoprazole  (PROTONIX ) 40 MG tablet TAKE 1 TABLET BY MOUTH DAILY 100 tablet 2   pregabalin (LYRICA) 100 MG capsule Take 100 mg by mouth 3 (three) times daily.      No results found for this or any previous visit (from the past 48 hours). No results found.  Review of Systems  All other systems reviewed and are negative.   Pulse 70, temperature 98.6 F (37 C), temperature source Oral, resp. rate 20, height 6' 4 (1.93 m), weight 136.1 kg, SpO2 97%. Physical Exam  GENERAL: The patient is AO x3, in no acute distress. HEENT: Head is normocephalic and atraumatic. EOMI are intact. Mouth is well hydrated and without lesions. NECK: Supple. No masses LUNGS: Clear to auscultation. No presence of rhonchi/wheezing/rales. Adequate chest expansion HEART: RRR, normal s1 and s2. ABDOMEN: Soft, nontender, no guarding, no peritoneal signs, and nondistended. BS +. No  masses. EXTREMITIES: Without any cyanosis, clubbing, rash, lesions or edema. NEUROLOGIC: AOx3, no focal motor deficit. SKIN: no jaundice, no rashes  Assessment/Plan Bruce Stewart is a 78 y.o. male with PMH moderate Crohn's ileitis, coronary artery disease, history of colon cancer, urolithiasis, who presents for follow up of Crohn's disease.   Patient had presented intermittent issues with abdominal pain and intermittent diarrhea. The patient denies having any nausea, vomiting, fever, chills, hematochezia, melena, hematemesis, abdominal distention,  jaundice, pruritus or weight loss.  Toribio Eartha Flavors, MD 10/13/2024, 8:46 AM       [1]  Allergies Allergen Reactions   Codeine Itching   Orange Fruit [Citrus] Itching   Bee Venom Rash

## 2024-10-13 NOTE — Transfer of Care (Signed)
 Immediate Anesthesia Transfer of Care Note  Patient: Bruce Stewart  Procedure(s) Performed: COLONOSCOPY POLYPECTOMY, INTESTINE  Patient Location: Short Stay  Anesthesia Type:MAC  Level of Consciousness: drowsy and patient cooperative  Airway & Oxygen Therapy: Patient Spontanous Breathing  Post-op Assessment: Report given to RN and Post -op Vital signs reviewed and stable  Post vital signs: Reviewed and stable  Last Vitals:  Vitals Value Taken Time  BP 131/61 10/13/24 09:41  Temp 37.1 C 10/13/24 09:41  Pulse 81 10/13/24 09:41  Resp 20 10/13/24 09:41  SpO2 98 % 10/13/24 09:41    Last Pain:  Vitals:   10/13/24 0941  TempSrc: Oral  PainSc: 0-No pain      Patients Stated Pain Goal: 6 (10/13/24 0941)  Complications: No notable events documented.

## 2024-10-13 NOTE — Anesthesia Procedure Notes (Signed)
 Date/Time: 10/13/2024 12:07 AM  Performed by: Para Jerelene CROME, CRNAOxygen Delivery Method: Nasal cannula Comments: OptiFlow Nasal Cannula.

## 2024-10-13 NOTE — Op Note (Signed)
 [facility] Patient Name: Bruce Stewart Procedure Date: 10/13/2024 8:47 AM MRN: [id number] Date of Birth: 1945-12-17 Attending MD: Toribio Fortune , , [id number] CSN: [id number] Age: 78 Admit Type: Outpatient Procedure:                Colonoscopy Indications:              Assess therapeutic response to therapy of Crohn's                            disease of the small bowel Providers:                Toribio Fortune, Olam Ada, RN, Gordy Lonni Balm, Technician Referring MD:              Medicines:                Monitored Anesthesia Care Complications:            No immediate complications. Estimated Blood Loss:     Estimated blood loss: none. Procedure:                Pre-Anesthesia Assessment:                           - Prior to the procedure, a History and Physical                            was performed, and patient medications, allergies                            and sensitivities were reviewed. The patient's                            tolerance of previous anesthesia was reviewed.                           - The risks and benefits of the procedure and the                            sedation options and risks were discussed with the                            patient. All questions were answered and informed                            consent was obtained.                           - ASA Grade Assessment: III - A patient with severe                            systemic disease.                           After obtaining informed consent, the colonoscope  was passed under direct vision. Throughout the                            procedure, the patient's blood pressure, pulse, and                            oxygen saturations were monitored continuously. The                            [id number]-[id number] ([id number]) Peds Colon was introduced                            through the anus and advanced to the the terminal                             ileum. The colonoscopy was performed without                            difficulty. The patient tolerated the procedure                            well. The quality of the bowel preparation was                            excellent. Scope In: 9:11:57 AM Scope Out: 9:34:33 AM Scope Withdrawal Time: 0 hours 17 minutes 23 seconds  Total Procedure Duration: 0 hours 22 minutes 36 seconds  Findings:      The perianal and digital rectal examinations were normal.      Localized inflammation characterized by small erosions x2 and erythema       was found in the terminal ileum. The inflammation was very mild in       severity. Overall, the inflammation severity appeared better compared to       prior. Biopsies were taken with a cold forceps for histology.      A 4 mm polyp was found in the ascending colon. The polyp was sessile.       The polyp was removed with a cold snare. Resection and retrieval were       complete.      Scattered medium-mouthed and small-mouthed diverticula were found in the       sigmoid colon and descending colon.      Non-bleeding internal hemorrhoids were found during retroflexion. The       hemorrhoids were small. Impression:               - Ileitis. Inflammation was found. This was mild in                            severity. Biopsied.                           - One 4 mm polyp in the ascending colon, removed                            with a cold snare. Resected and retrieved.                           -  Diverticulosis in the sigmoid colon and in the                            descending colon.                           - Non-bleeding internal hemorrhoids. Moderate Sedation:      Per Anesthesia Care Recommendation:           - Discharge patient to home (ambulatory).                           - Resume previous diet.                           - Await pathology results.                           - Repeat colonoscopy in 3 years for surveillance.                            - Continue Stelara  every 4 weeks.                           - Start dicyclomine 10 mg every 12 hours as needed                            for abdominal pain. Procedure Code(s):        --- Professional ---                           [phone], Colonoscopy, flexible; with removal of                            tumor(s), polyp(s), or other lesion(s) by snare                            technique                           [id number], 59, Colonoscopy, flexible; with biopsy,                            single or multiple Diagnosis Code(s):        --- Professional ---                           K52.9, Noninfective gastroenteritis and colitis,                            unspecified                           D12.2, Benign neoplasm of ascending colon                           K64.8, Other hemorrhoids  K50.00, Crohn's disease of small intestine without                            complications                           K57.30, Diverticulosis of large intestine without                            perforation or abscess without bleeding CPT copyright 2022 [employer]. All rights reserved. The codes documented in this report are preliminary and upon coder review may  be revised to meet current compliance requirements. Toribio Fortune, MD Toribio Fortune,  10/13/2024 9:45:52 AM This report has been signed electronically. Number of Addenda: 0

## 2024-10-16 ENCOUNTER — Encounter (HOSPITAL_COMMUNITY): Payer: Self-pay | Admitting: Gastroenterology

## 2024-10-16 LAB — SURGICAL PATHOLOGY

## 2024-10-17 ENCOUNTER — Ambulatory Visit (INDEPENDENT_AMBULATORY_CARE_PROVIDER_SITE_OTHER): Payer: Self-pay | Admitting: Gastroenterology

## 2024-10-17 ENCOUNTER — Encounter (INDEPENDENT_AMBULATORY_CARE_PROVIDER_SITE_OTHER): Payer: Self-pay | Admitting: *Deleted

## 2024-10-20 NOTE — Anesthesia Postprocedure Evaluation (Signed)
"   Anesthesia Post Note  Patient: Bruce Stewart  Procedure(s) Performed: COLONOSCOPY POLYPECTOMY, INTESTINE  Patient location during evaluation: Phase II Anesthesia Type: MAC Level of consciousness: awake Pain management: pain level controlled Vital Signs Assessment: post-procedure vital signs reviewed and stable Respiratory status: spontaneous breathing and respiratory function stable Cardiovascular status: blood pressure returned to baseline and stable Postop Assessment: no headache and no apparent nausea or vomiting Anesthetic complications: no Comments: Late entry   No notable events documented.   Last Vitals:  Vitals:   10/13/24 0822 10/13/24 0941  BP:  131/61  Pulse: 70 81  Resp: 20 20  Temp: 37 C 37.1 C  SpO2: 97% 98%    Last Pain:  Vitals:   10/13/24 0941  TempSrc: Oral  PainSc: 0-No pain                 Yvonna PARAS Daysean Tinkham      "

## 2024-10-20 NOTE — Progress Notes (Signed)
 3 yr TCS noted in recall Patient result letter mailed

## 2024-10-31 NOTE — Telephone Encounter (Signed)
 10/31/2024: I spoke with Bruce Stewart with [employer] and she says reverification is still pending for free Stelara  for 2026. They will re run patient's benefits on 11/02/2024, to see if they still qualify for free medication for 2026. They will reach out to the patient and the office, to let them know the out come at that time, whether approved for free medication for 2026, or not.

## 2024-11-03 NOTE — Telephone Encounter (Signed)
 I spoke with Bruce Stewart with [employer] she says the patient has been approved for the Patient assistance for 2026 for Stelara . They will be reaching out to the patient regarding their approval.

## 2024-11-21 ENCOUNTER — Other Ambulatory Visit (INDEPENDENT_AMBULATORY_CARE_PROVIDER_SITE_OTHER): Payer: Self-pay

## 2024-11-21 ENCOUNTER — Telehealth (INDEPENDENT_AMBULATORY_CARE_PROVIDER_SITE_OTHER): Payer: Self-pay

## 2024-11-21 DIAGNOSIS — K633 Ulcer of intestine: Secondary | ICD-10-CM

## 2024-11-21 MED ORDER — PANTOPRAZOLE SODIUM 40 MG PO TBEC: 40.0000 mg | DELAYED_RELEASE_TABLET | Freq: Every day | ORAL | 2 refills | Status: AC

## 2024-11-21 NOTE — Telephone Encounter (Signed)
 SABRA
